# Patient Record
Sex: Female | Born: 1935
Health system: Southern US, Community
[De-identification: ages and names within clinical notes are randomized; demographics above are authoritative.]

## PROBLEM LIST (undated history)

## (undated) DIAGNOSIS — E039 Hypothyroidism, unspecified: Secondary | ICD-10-CM

## (undated) DIAGNOSIS — M359 Systemic involvement of connective tissue, unspecified: Secondary | ICD-10-CM

## (undated) DIAGNOSIS — E785 Hyperlipidemia, unspecified: Secondary | ICD-10-CM

## (undated) DIAGNOSIS — K8020 Calculus of gallbladder without cholecystitis without obstruction: Secondary | ICD-10-CM

## (undated) DIAGNOSIS — F329 Major depressive disorder, single episode, unspecified: Secondary | ICD-10-CM

## (undated) DIAGNOSIS — M1990 Unspecified osteoarthritis, unspecified site: Secondary | ICD-10-CM

## (undated) DIAGNOSIS — K5790 Diverticulosis of intestine, part unspecified, without perforation or abscess without bleeding: Secondary | ICD-10-CM

## (undated) DIAGNOSIS — D126 Benign neoplasm of colon, unspecified: Secondary | ICD-10-CM

## (undated) DIAGNOSIS — I10 Essential (primary) hypertension: Secondary | ICD-10-CM

## (undated) DIAGNOSIS — F32A Depression, unspecified: Secondary | ICD-10-CM

## (undated) DIAGNOSIS — M810 Age-related osteoporosis without current pathological fracture: Secondary | ICD-10-CM

## (undated) DIAGNOSIS — J449 Chronic obstructive pulmonary disease, unspecified: Secondary | ICD-10-CM

## (undated) DIAGNOSIS — R011 Cardiac murmur, unspecified: Secondary | ICD-10-CM

## (undated) HISTORY — DX: Systemic involvement of connective tissue, unspecified: M35.9

## (undated) HISTORY — DX: Benign neoplasm of colon, unspecified: D12.6

## (undated) HISTORY — DX: Age-related osteoporosis without current pathological fracture: M81.0

## (undated) HISTORY — DX: Cardiac murmur, unspecified: R01.1

## (undated) HISTORY — PX: WRIST FRACTURE SURGERY: SHX121

## (undated) HISTORY — DX: Unspecified osteoarthritis, unspecified site: M19.90

## (undated) HISTORY — DX: Hypothyroidism, unspecified: E03.9

## (undated) HISTORY — DX: Hyperlipidemia, unspecified: E78.5

## (undated) HISTORY — DX: Chronic obstructive pulmonary disease, unspecified: J44.9

## (undated) HISTORY — DX: Calculus of gallbladder without cholecystitis without obstruction: K80.20

## (undated) HISTORY — DX: Diverticulosis of intestine, part unspecified, without perforation or abscess without bleeding: K57.90

---
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
## 1999-07-31 ENCOUNTER — Other Ambulatory Visit: Admission: RE | Admit: 1999-07-31 | Discharge: 1999-07-31 | Payer: Self-pay | Admitting: Cardiology

## 2005-08-12 ENCOUNTER — Encounter: Admission: RE | Admit: 2005-08-12 | Discharge: 2005-08-12 | Payer: Self-pay | Admitting: Internal Medicine

## 2005-08-20 ENCOUNTER — Encounter: Admission: RE | Admit: 2005-08-20 | Discharge: 2005-08-20 | Payer: Self-pay | Admitting: Rheumatology

## 2005-08-26 ENCOUNTER — Encounter: Admission: RE | Admit: 2005-08-26 | Discharge: 2005-08-26 | Payer: Self-pay | Admitting: Rheumatology

## 2006-01-10 ENCOUNTER — Emergency Department (HOSPITAL_COMMUNITY): Admission: EM | Admit: 2006-01-10 | Discharge: 2006-01-10 | Payer: Self-pay | Admitting: Family Medicine

## 2006-07-25 ENCOUNTER — Emergency Department (HOSPITAL_COMMUNITY): Admission: EM | Admit: 2006-07-25 | Discharge: 2006-07-25 | Payer: Self-pay | Admitting: Emergency Medicine

## 2006-08-02 ENCOUNTER — Inpatient Hospital Stay (HOSPITAL_COMMUNITY): Admission: EM | Admit: 2006-08-02 | Discharge: 2006-08-04 | Payer: Self-pay | Admitting: Family Medicine

## 2006-08-06 ENCOUNTER — Ambulatory Visit: Payer: Self-pay | Admitting: Gastroenterology

## 2006-10-16 ENCOUNTER — Ambulatory Visit (HOSPITAL_COMMUNITY): Admission: EM | Admit: 2006-10-16 | Discharge: 2006-10-17 | Payer: Self-pay | Admitting: Orthopedic Surgery

## 2006-10-16 ENCOUNTER — Encounter: Payer: Self-pay | Admitting: Emergency Medicine

## 2009-12-15 ENCOUNTER — Emergency Department (HOSPITAL_COMMUNITY): Admission: EM | Admit: 2009-12-15 | Discharge: 2009-12-15 | Payer: Self-pay | Admitting: Family Medicine

## 2010-08-16 ENCOUNTER — Other Ambulatory Visit: Payer: Self-pay | Admitting: Internal Medicine

## 2010-08-16 DIAGNOSIS — R2681 Unsteadiness on feet: Secondary | ICD-10-CM

## 2010-08-20 ENCOUNTER — Ambulatory Visit
Admission: RE | Admit: 2010-08-20 | Discharge: 2010-08-20 | Disposition: A | Discharge status: Home / Self Care | Payer: Medicare HMO | Source: Ambulatory Visit | Attending: Internal Medicine | Admitting: Internal Medicine

## 2010-08-20 ENCOUNTER — Other Ambulatory Visit: Payer: Self-pay

## 2010-08-20 DIAGNOSIS — R2681 Unsteadiness on feet: Secondary | ICD-10-CM

## 2010-10-15 NOTE — Consult Note (Signed)
NAME:  Sarah Manning, Sarah Manning               ACCOUNT NO.:  [id number]   MEDICAL RECORD NO.:  [id number]          PATIENT TYPE:  OIB   LOCATION:  5034                         FACILITY:  [facility]   PHYSICIAN:  Artist Pais. Weingold, M.D.DATE OF BIRTH:  1925-01-27   DATE OF CONSULTATION:  10/16/2006  DATE OF DISCHARGE:                                 CONSULTATION   ER CONSULT:   REQUESTING PHYSICIAN:  Donnetta Hutching, M.D.   REASON FOR CONSULTATION:  Sarah Manning is a 77 year old right-hand  dominant female who was at the [employer] graduation, watching her grandson  graduate, when she fell and sustained a significant injury to her left  upper extremity.  She was taken to the [facility] emergency  room with a contusion over the left eye and an obvious deformity to her  left hand and wrist.  She is an otherwise healthy 77 year old female.   She has no known drug allergies.   Currently on Synthroid.  No other medications.  No recent  hospitalizations or surgery.   FAMILY HISTORY:  Noncontributory.   SOCIAL HISTORY:  Noncontributory.   PHYSICAL EXAMINATION:  GENERAL:  A well-developed and well-nourished  female in mild distress.  She has an obvious angular deformity to her  hand and wrist area on the left with subjective numbness and tingling in  her fingers.  There is no open wound, but the skin is tented on the  volar aspect.   X-rays show a fracture dislocation of the radius and the ulna with  significant dorsal displacement.   Patient was given a 2% plain lidocaine hematoma block, was placed in  finger trap traction with 10 pounds of counter traction across the  radial carpal joint.  Closed reduction was performed.  She was placed in  a sugar tong splint, and she will be transferred to [facility]  for operative intervention to include carpal tunnel release for  significant numbness and tingling as well as operative fixation of her  displaced radius and ulnar fractures.      Artist Pais Mina Marble, M.D.  Electronically Signed     MAW/MEDQ  D:  10/16/2006  T:  10/16/2006  Job:  [id number]

## 2010-10-15 NOTE — Op Note (Signed)
NAME:  Cafaro, Eshika               ACCOUNT NO.:  [id number]   MEDICAL RECORD NO.:  [id number]          PATIENT TYPE:  OIB   LOCATION:  5034                         FACILITY:  [facility]   PHYSICIAN:  Artist Pais. Weingold, M.D.DATE OF BIRTH:  03-05-1925   DATE OF PROCEDURE:  10/16/2006  DATE OF DISCHARGE:                               OPERATIVE REPORT   PREOPERATIVE DIAGNOSES:  1. Displaced left radius and ulnar fractures.  2. Carpal tunnel syndrome.   POSTOPERATIVE DIAGNOSES:  1. Displaced left radius and ulnar fractures.  2. Carpal tunnel syndrome.   PROCEDURE:  ORIF above using DVR plate and screws and carpal tunnel  release.   SURGEON:  Artist Pais. Mina Marble, M.D.   ASSISTANT:  None.   ANESTHESIA:  General.   TOURNIQUET TIME:  1 hour.   COMPLICATIONS:  None.   DRAIN:  None.   DESCRIPTION OF PROCEDURE:  The patient was taken to the operating suite  and after the induction of general anesthesia, the left upper extremity  was prepped in the usual sterile fashion.  Esmarch was used to  exsanguinate the limb, the tourniquet was inflated to 215 mmHg. At this  point in time, a standard FCR approach to the distal radius was  undertaken.  The skin was incised over the palpable border of the flexor  carpi radialis per 6 cm. The sheath overlying the FCR was incised, it  was tracked to the midline, the radial artery to the lateral . The  interval between these two was developed and dissection was carried down  to the level of the pronator quadratus.  This was dissected off the  volar aspect of the distal radius.  It was disrupted distally in the  area of the fracture site.  The fracture site was identified  and  debrided of clot, reduction was performed after release of the first  dorsal compartment in the brachialis. After reduction was performed, a  standard DVR left plate was placed on the lower aspect of the distal  radius under direct and fluoroscopic guidance and was  positioned  appropriately, fixed with cortical screws followed by placement of  smooth pegs distally.  After this was done, a closed reduction was  performed of the ulnar fracture which was then deemed stable both in  flexion/extension,  pronation/supination. The median nerve was  identified proximally. Tracing the carpal canal, a counter incision was  made in the palmar aspect of the left hand. A small incision was made  over the transverse carpal ligament which was divided thus decompressing  the nerve. The wound  was irrigated.  There was blood in the canal, this was irrigated out and  both incisions were then closed with 3-0 Prolene subcuticular stitches.  Steri-Strips, 4x4s fluffs and a volar splint was applied.  The patient  tolerated both procedures well and went to recovery in stable condition.      Artist Pais Mina Marble, M.D.  Electronically Signed     MAW/MEDQ  D:  10/16/2006  T:  10/17/2006  Job:  [id number]

## 2010-10-18 NOTE — Discharge Summary (Signed)
NAME:  Manning, Sarah               ACCOUNT NO.:  [id number]   MEDICAL RECORD NO.:  [id number]          PATIENT TYPE:  INP   LOCATION:  4714                         FACILITY:  [facility]   PHYSICIAN:  Mark A. Perini, M.D.   DATE OF BIRTH:  1925-04-13   DATE OF ADMISSION:  08/02/2006  DATE OF DISCHARGE:  08/04/2006                               DISCHARGE SUMMARY   DISCHARGE DIAGNOSES:  1. Acute lower gastrointestinal bleed most likely due to      diverticulosis.  2. Colon polyp 4 mm removed during colonoscopy this admission.  3. Internal hemorrhoids.  4. Hypothyroidism.  5. Recent allergic reaction to Vytorin which caused a rash which is      resolved currently.  6. Osteopenia.   PROCEDURES:  1. Colonoscopy which showed a colon polyp, diverticulosis and internal      hemorrhoids and upper endoscopy which was normal.  2. Transfusion of 2 units of packed red cells.   DISCHARGE MEDICATIONS:  Sarah Manning is to stop aspirin therapy.  She is to  hold Fosamax until further notice.  She is to resume her Synthroid at  her previous dose.  She may resume her multivitamins and she is to take  an over-the-counter ferrous sulfate pill 325 mg once daily with food  until further notice.   HISTORY OF PRESENT ILLNESS:  Sarah Manning is a pleasant 77 year old female who  presented with acute onset of bright red bleeding per rectum and  presented to the emergency room.  She had persistent bleeding and was  admitted.  She required IV fluid resuscitation and did require 2 units  of packed red cells.  She was seen by GI and prepped and subsequently  taken to endoscopy with results as noted above.  Her bleeding stopped.  She remained hemodynamically stable during the entire stay and on August 04, 2006, she was deemed stable for discharge home.   DISCHARGE PHYSICAL EXAM:  VITAL SIGNS:  Temperature 97.6, afebrile,  pulse 81, respiratory rate 18, blood pressure 130/59, 99% saturation on  room air.  GENERAL:  She is in no  acute distress.  Alert and oriented  x4.  LUNGS:  Clear to auscultation bilaterally with no wheezes, rales or  rhonchi.  HEART:  Regular rate and rhythm with no murmur, rub or gallop.  There is no edema.   DISCHARGE LABORATORY DATA:  White count 6.6, hemoglobin 9.6, platelet  count 216,000.  Other notable laboratory data on August 03, 2006, sodium  139, potassium 4.4, chloride 108, CO2 of 20, BUN 13, creatinine 0.64,  glucose 107.  LFTs normal except an albumin of 2.5.   DISCHARGE INSTRUCTIONS:  Sarah Manning is to follow a low-salt diet.  She is to  increase her activity slowly.  She has a physical with me next week and  is going to keep this visit.  She will call if she has any recurrent  problems.           ______________________________  Redge Gainer Waynard Edwards, M.D.     MAP/MEDQ  D:  08/04/2006  T:  08/04/2006  Job:  [id number]  cc:   Judie Petit T. Russella Dar, MD, Clementeen Graham

## 2010-10-18 NOTE — H&P (Signed)
NAME:  Sarah Manning, Sarah Manning               ACCOUNT NO.:  [id number]   MEDICAL RECORD NO.:  [id number]          PATIENT TYPE:  INP   LOCATION:  4714                         FACILITY:  [facility]   PHYSICIAN:  Geoffry Paradise, M.D.  DATE OF BIRTH:  1924-12-22   DATE OF ADMISSION:  08/02/2006  DATE OF DISCHARGE:                              HISTORY & PHYSICAL   CHIEF COMPLAINT:  Gastrointestinal bleed.   HISTORY OF PRESENT ILLNESS:  Ms. Fredric Mare is a very pleasant 77 year old  who has been exceptionally healthy, presenting with multiple episodes of  bright red blood per rectum.  She has no prior GI history, and her last  colonoscopy was 4 years ago, unremarkable.  She had an excellent day  yesterday and was quite healthy but awoke this morning at 9:00 a.m. with  rectal pressure and subsequently has had at least 4 to 5 episodes of  profuse bright red blood per rectum.  She has had no frank abdominal  pain.  She denies any chest pain or shortness of breath.  She has had no  headache, dizziness or lightheadedness.  She had no melenic stool and  has been on no aspirin products, but HAS HAD A RECENT COURSE OF  STERAPRED FOR AN ALLERGIC REACTION TO VYTORIN.  She is admitted at this  time for bowel rest, IV fluids and monitoring.   PAST MEDICAL HISTORY:  NO KNOWN DRUG ALLERGIES.   MEDICATIONS:  Include Synthroid 125 mcg daily, Fosamax 70 mg weekly,  multivitamin one daily, Benadryl p.r.n. and recent Sterapred.   MEDICAL ILLNESSES:  Include hypothyroidism and osteopenia.   SURGICAL ILLNESSES:  None.   HOSPITALIZATIONS:  Only childbirth.   FAMILY HISTORY:  Noncontributory.   SOCIAL HISTORY:  The patient is married and has two children.  She is  retired.  Does not smoke or drink and is extremely active.   PHYSICAL EXAM:  VITAL SIGNS:  Temperature 97.9, blood pressure 147/83,  pulse 92.  Respiratory rate is 18, O2 saturation 99%.  GENERAL:  The patient looks exceptionally healthy.  She is bright,  alert, oriented, talkative.  HEENT:  Anicteric.  Pupils round reactive light accommodation.  Oropharynx benign.  NECK:  No JVD or bruits.  LUNGS:  Clear.  CARDIOVASCULAR:  Regular rate and rhythm.  No murmur, gallop, rub or  heave.  BACK:  Without CVA or spinal tenderness.  ABDOMEN:  Soft, nontender.  No hepatosplenomegaly.  Bowel sounds normal.  RECTAL:  Exam per EDP, vault full of bright red blood.  EXTREMITIES:  No cyanosis, clubbing or edema.  Intact distal pulses.  Joints normal.  NEUROLOGICALLY:  She is nonfocal.   LABORATORY:  H&H:  Hemoglobin 12.4, hematocrit 37.2.  Chemistries  pending.   ASSESSMENT:  Lower gastrointestinal bleed compatible with a diverticular  bleed, hemodynamically stable at this time.  Hemoglobin stable.   PLAN:  Admit for IV fluids, bowel rest, empiric proton pump inhibitor  and GI consult for endoscopy/colonoscopy.           ______________________________  Geoffry Paradise, M.D.     RA/MEDQ  D:  08/02/2006  T:  08/02/2006  Job:  [id number]

## 2010-12-13 ENCOUNTER — Other Ambulatory Visit (HOSPITAL_COMMUNITY): Payer: Self-pay | Admitting: Internal Medicine

## 2010-12-13 DIAGNOSIS — R011 Cardiac murmur, unspecified: Secondary | ICD-10-CM

## 2010-12-16 ENCOUNTER — Ambulatory Visit (HOSPITAL_COMMUNITY): Payer: Medicare HMO | Attending: Cardiology | Admitting: Radiology

## 2010-12-16 DIAGNOSIS — I080 Rheumatic disorders of both mitral and aortic valves: Secondary | ICD-10-CM | POA: Insufficient documentation

## 2010-12-16 DIAGNOSIS — I079 Rheumatic tricuspid valve disease, unspecified: Secondary | ICD-10-CM | POA: Insufficient documentation

## 2010-12-16 DIAGNOSIS — R011 Cardiac murmur, unspecified: Secondary | ICD-10-CM | POA: Insufficient documentation

## 2010-12-16 DIAGNOSIS — E785 Hyperlipidemia, unspecified: Secondary | ICD-10-CM | POA: Insufficient documentation

## 2010-12-17 ENCOUNTER — Encounter (HOSPITAL_COMMUNITY): Payer: Self-pay | Admitting: Radiology

## 2013-05-05 ENCOUNTER — Ambulatory Visit (HOSPITAL_COMMUNITY)
Admission: RE | Admit: 2013-05-05 | Discharge: 2013-05-05 | Disposition: A | Discharge status: Home / Self Care | Payer: Medicare HMO | Source: Ambulatory Visit | Attending: Vascular Surgery | Admitting: Vascular Surgery

## 2013-05-05 ENCOUNTER — Other Ambulatory Visit (HOSPITAL_COMMUNITY): Payer: Self-pay | Admitting: Internal Medicine

## 2013-05-05 ENCOUNTER — Encounter (INDEPENDENT_AMBULATORY_CARE_PROVIDER_SITE_OTHER): Payer: Self-pay

## 2013-05-05 DIAGNOSIS — M79609 Pain in unspecified limb: Secondary | ICD-10-CM

## 2014-06-15 DIAGNOSIS — M7062 Trochanteric bursitis, left hip: Secondary | ICD-10-CM | POA: Diagnosis not present

## 2014-11-06 ENCOUNTER — Emergency Department (HOSPITAL_COMMUNITY)
Admission: EM | Admit: 2014-11-06 | Discharge: 2014-11-06 | Disposition: A | Discharge status: Home / Self Care | Payer: Commercial Managed Care - HMO | Attending: Emergency Medicine | Admitting: Emergency Medicine

## 2014-11-06 ENCOUNTER — Encounter (HOSPITAL_COMMUNITY): Payer: Self-pay | Admitting: Emergency Medicine

## 2014-11-06 ENCOUNTER — Emergency Department (HOSPITAL_COMMUNITY): Payer: Commercial Managed Care - HMO

## 2014-11-06 DIAGNOSIS — Z880 Allergy status to penicillin: Secondary | ICD-10-CM | POA: Insufficient documentation

## 2014-11-06 DIAGNOSIS — R51 Headache: Secondary | ICD-10-CM | POA: Insufficient documentation

## 2014-11-06 DIAGNOSIS — Z6824 Body mass index (BMI) 24.0-24.9, adult: Secondary | ICD-10-CM | POA: Diagnosis not present

## 2014-11-06 DIAGNOSIS — K8020 Calculus of gallbladder without cholecystitis without obstruction: Secondary | ICD-10-CM | POA: Diagnosis not present

## 2014-11-06 DIAGNOSIS — R1084 Generalized abdominal pain: Secondary | ICD-10-CM | POA: Insufficient documentation

## 2014-11-06 DIAGNOSIS — I10 Essential (primary) hypertension: Secondary | ICD-10-CM | POA: Diagnosis not present

## 2014-11-06 DIAGNOSIS — R109 Unspecified abdominal pain: Secondary | ICD-10-CM | POA: Diagnosis not present

## 2014-11-06 DIAGNOSIS — R509 Fever, unspecified: Secondary | ICD-10-CM | POA: Insufficient documentation

## 2014-11-06 HISTORY — DX: Essential (primary) hypertension: I10

## 2014-11-06 LAB — COMPREHENSIVE METABOLIC PANEL
ALT: 17 U/L (ref 14–54)
AST: 24 U/L (ref 15–41)
Albumin: 3.6 g/dL (ref 3.5–5.0)
Alkaline Phosphatase: 52 U/L (ref 38–126)
Anion gap: 9 (ref 5–15)
BILIRUBIN TOTAL: 0.6 mg/dL (ref 0.3–1.2)
BUN: 10 mg/dL (ref 6–20)
CHLORIDE: 105 mmol/L (ref 101–111)
CO2: 28 mmol/L (ref 22–32)
Calcium: 9.2 mg/dL (ref 8.9–10.3)
Creatinine, Ser: 0.75 mg/dL (ref 0.44–1.00)
Glucose, Bld: 96 mg/dL (ref 65–99)
POTASSIUM: 4 mmol/L (ref 3.5–5.1)
SODIUM: 142 mmol/L (ref 135–145)
Total Protein: 7.1 g/dL (ref 6.5–8.1)

## 2014-11-06 LAB — CBC WITH DIFFERENTIAL/PLATELET
BASOS ABS: 0 10*3/uL (ref 0.0–0.1)
BASOS PCT: 0 % (ref 0–1)
EOS ABS: 0 10*3/uL (ref 0.0–0.7)
Eosinophils Relative: 0 % (ref 0–5)
HEMATOCRIT: 37.1 % (ref 36.0–46.0)
HEMOGLOBIN: 12.2 g/dL (ref 12.0–15.0)
LYMPHS ABS: 1.2 10*3/uL (ref 0.7–4.0)
LYMPHS PCT: 24 % (ref 12–46)
MCH: 29.7 pg (ref 26.0–34.0)
MCHC: 32.9 g/dL (ref 30.0–36.0)
MCV: 90.3 fL (ref 78.0–100.0)
Monocytes Absolute: 0.5 10*3/uL (ref 0.1–1.0)
Monocytes Relative: 10 % (ref 3–12)
NEUTROS PCT: 66 % (ref 43–77)
Neutro Abs: 3.4 10*3/uL (ref 1.7–7.7)
Platelets: 268 10*3/uL (ref 150–400)
RBC: 4.11 MIL/uL (ref 3.87–5.11)
RDW: 12.4 % (ref 11.5–15.5)
WBC: 5.2 10*3/uL (ref 4.0–10.5)

## 2014-11-06 LAB — URINALYSIS, ROUTINE W REFLEX MICROSCOPIC
Bilirubin Urine: NEGATIVE
GLUCOSE, UA: NEGATIVE mg/dL
HGB URINE DIPSTICK: NEGATIVE
Ketones, ur: NEGATIVE mg/dL
LEUKOCYTES UA: NEGATIVE
NITRITE: NEGATIVE
Protein, ur: NEGATIVE mg/dL
SPECIFIC GRAVITY, URINE: 1.018 (ref 1.005–1.030)
UROBILINOGEN UA: 0.2 mg/dL (ref 0.0–1.0)
pH: 6 (ref 5.0–8.0)

## 2014-11-06 LAB — I-STAT TROPONIN, ED: Troponin i, poc: 0 ng/mL (ref 0.00–0.08)

## 2014-11-06 LAB — LACTIC ACID, PLASMA: LACTIC ACID, VENOUS: 1.1 mmol/L (ref 0.5–2.0)

## 2014-11-06 LAB — LIPASE, BLOOD: Lipase: 20 U/L — ABNORMAL LOW (ref 22–51)

## 2014-11-06 MED ORDER — ONDANSETRON HCL 4 MG PO TABS: 4.0000 mg | ORAL_TABLET | Freq: Four times a day (QID) | ORAL | Status: DC

## 2014-11-06 MED ORDER — IOHEXOL 300 MG/ML  SOLN
100.0000 mL | Freq: Once | INTRAMUSCULAR | Status: AC | PRN
  Administered 2014-11-06: 100 mL via INTRAVENOUS

## 2014-11-06 MED ORDER — IOHEXOL 300 MG/ML  SOLN
25.0000 mL | Freq: Once | INTRAMUSCULAR | Status: AC | PRN
  Administered 2014-11-06: 25 mL via ORAL

## 2014-11-06 NOTE — ED Notes (Signed)
Pt in via EMS C/O abd pain, fever/chills, as well as headache. Pt states it has been going on for past week but got worse Saturday.

## 2014-11-06 NOTE — Discharge Instructions (Signed)
You had a CT scan performed today.  The scan shows you have gallstones in your gallbladder.  Please follow up with your family doctor for further evaluation.    Abdominal Pain Many things can cause abdominal pain. Usually, abdominal pain is not caused by a disease and will improve without treatment. It can often be observed and treated at home. Your health care provider will do a physical exam and possibly order blood tests and X-rays to help determine the seriousness of your pain. However, in many cases, more time must pass before a clear cause of the pain can be found. Before that point, your health care provider may not know if you need more testing or further treatment. HOME CARE INSTRUCTIONS  Monitor your abdominal pain for any changes. The following actions may help to alleviate any discomfort you are experiencing:  Only take over-the-counter or prescription medicines as directed by your health care provider.  Do not take laxatives unless directed to do so by your health care provider.  Try a clear liquid diet (broth, tea, or water) as directed by your health care provider. Slowly move to a bland diet as tolerated. SEEK MEDICAL CARE IF:  You have unexplained abdominal pain.  You have abdominal pain associated with nausea or diarrhea.  You have pain when you urinate or have a bowel movement.  You experience abdominal pain that wakes you in the night.  You have abdominal pain that is worsened or improved by eating food.  You have abdominal pain that is worsened with eating fatty foods.  You have a fever. SEEK IMMEDIATE MEDICAL CARE IF:   Your pain does not go away within 2 hours.  You keep throwing up (vomiting).  Your pain is felt only in portions of the abdomen, such as the right side or the left lower portion of the abdomen.  You pass bloody or black tarry stools. MAKE SURE YOU:  Understand these instructions.   Will watch your condition.   Will get help right away  if you are not doing well or get worse.  Document Released: 02/26/2005 Document Revised: 05/24/2013 Document Reviewed: 01/26/2013 [facility] Patient Information 2015 [place], [place]. This information is not intended to replace advice given to you by your health care provider. Make sure you discuss any questions you have with your health care provider.  Cholelithiasis Cholelithiasis (also called gallstones) is a form of gallbladder disease in which gallstones form in your gallbladder. The gallbladder is an organ that stores bile made in the liver, which helps digest fats. Gallstones begin as small crystals and slowly grow into stones. Gallstone pain occurs when the gallbladder spasms and a gallstone is blocking the duct. Pain can also occur when a stone passes out of the duct.  RISK FACTORS  Being female.   Having multiple pregnancies. Health care providers sometimes advise removing diseased gallbladders before future pregnancies.   Being obese.  Eating a diet heavy in fried foods and fat.   Being older than 68 years and increasing age.   Prolonged use of medicines containing female hormones.   Having diabetes mellitus.   Rapidly losing weight.   Having a family history of gallstones (heredity).  SYMPTOMS  Nausea.   Vomiting.  Abdominal pain.   Yellowing of the skin (jaundice).   Sudden pain. It may persist from several minutes to several hours.  Fever.   Tenderness to the touch. In some cases, when gallstones do not move into the bile duct, people have no pain  or symptoms. These are called "silent" gallstones.  TREATMENT Silent gallstones do not need treatment. In severe cases, emergency surgery may be required. Options for treatment include:  Surgery to remove the gallbladder. This is the most common treatment.  Medicines. These do not always work and may take 6-12 months or more to work.  Shock wave treatment (extracorporeal biliary lithotripsy). In this  treatment an ultrasound machine sends shock waves to the gallbladder to break gallstones into smaller pieces that can pass into the intestines or be dissolved by medicine. HOME CARE INSTRUCTIONS   Only take over-the-counter or prescription medicines for pain, discomfort, or fever as directed by your health care provider.   Follow a low-fat diet until seen again by your health care provider. Fat causes the gallbladder to contract, which can result in pain.   Follow up with your health care provider as directed. Attacks are almost always recurrent and surgery is usually required for permanent treatment.  SEEK IMMEDIATE MEDICAL CARE IF:   Your pain increases and is not controlled by medicines.   You have a fever or persistent symptoms for more than 2-3 days.   You have a fever and your symptoms suddenly get worse.   You have persistent nausea and vomiting.  MAKE SURE YOU:   Understand these instructions.  Will watch your condition.  Will get help right away if you are not doing well or get worse. Document Released: 05/15/2005 Document Revised: 01/19/2013 Document Reviewed: 11/10/2012 [facility] Patient Information 2015 [place], [place]. This information is not intended to replace advice given to you by your health care provider. Make sure you discuss any questions you have with your health care provider.

## 2014-11-06 NOTE — ED Notes (Signed)
Pt kept fluids down. Reporting no abd pain at this time.

## 2014-11-06 NOTE — ED Notes (Signed)
Pt resting, visitor at bedside, no needs at this time

## 2014-11-06 NOTE — ED Notes (Signed)
Pt d/c'd from IV, monitor, continuous pulse oximetry and blood pressure cuff; pt getting dressed to be discharged home

## 2014-11-06 NOTE — ED Provider Notes (Signed)
CSN: [id number]     Arrival date & time 11/06/14  0730 History   First MD Initiated Contact with Patient 11/06/14 [phone]     Chief Complaint  Patient presents with  . Abdominal Pain  . Fever  . Chills  . Headache    Patient is a 81 y.o. female presenting with abdominal pain, fever, and headaches. The history is provided by the patient. No language interpreter was used.  Abdominal Pain Associated symptoms: fever   Fever Associated symptoms: headaches   Headache Associated symptoms: abdominal pain and fever    Sarah Manning presents for evaluation of abdominal pain. She reports 1 week of intermittent abdominal pain. The pain is described as generalized and feels like a toothache. She initially felt like she needed to have a bowel movement with this pain. Her last bowel movement was this morning and was normal. She took milk of magnesia yesterday. She has nausea but no vomiting. No dysuria. There are no clear alleviating or worsening factors for the abdominal pain. She has no history of similar previous symptoms. She had diaphoresis today. She denies any chest pain, cough. Symptoms are moderate, waxing and waning, worsening.   Past Medical History  Diagnosis Date  . Hypertension    History reviewed. No pertinent past surgical history. No family history on file. History  Substance Use Topics  . Smoking status: Never Smoker   . Smokeless tobacco: Never Used  . Alcohol Use: No   OB History    No data available     Review of Systems  Constitutional: Positive for fever.  Gastrointestinal: Positive for abdominal pain.  Neurological: Positive for headaches.  All other systems reviewed and are negative.     Allergies  Amoxicillin and Penicillins  Home Medications   Prior to Admission medications   Not on File   BP 184/68 mmHg  Pulse 73  Temp(Src) 98.3 F (36.8 C) (Oral)  Resp 19  SpO2 99% Physical Exam  Constitutional: She is oriented to person, place, and time. She  appears well-developed and well-nourished.  HENT:  Head: Normocephalic and atraumatic.  Cardiovascular: Normal rate and regular rhythm.   No murmur heard. Pulmonary/Chest: Effort normal and breath sounds normal. No respiratory distress.  Abdominal: Soft. There is no rebound and no guarding.  Moderate lower abdominal tenderness  Musculoskeletal: She exhibits no edema or tenderness.  Neurological: She is alert and oriented to person, place, and time.  Skin: Skin is warm and dry.  Psychiatric: She has a normal mood and affect. Her behavior is normal.  Nursing note and vitals reviewed.   ED Course  Procedures (including critical care time) Labs Review Labs Reviewed  LIPASE, BLOOD - Abnormal; Notable for the following:    Lipase 20 (*)    All other components within normal limits  CBC WITH DIFFERENTIAL/PLATELET  COMPREHENSIVE METABOLIC PANEL  URINALYSIS, ROUTINE W REFLEX MICROSCOPIC (NOT AT [facility])  LACTIC ACID, PLASMA  I-STAT TROPOININ, ED    Imaging Review Ct Abdomen Pelvis W Contrast  11/06/2014   CLINICAL DATA:  Abdominal pain, fever, and chills.  Headache.  EXAM: CT ABDOMEN AND PELVIS WITH CONTRAST  TECHNIQUE: Multidetector CT imaging of the abdomen and pelvis was performed using the standard protocol following bolus administration of intravenous contrast.  CONTRAST:  136mL OMNIPAQUE IOHEXOL 300 MG/ML  SOLN  COMPARISON:  08/26/2005  FINDINGS: BODY WALL: No contributory findings.  LOWER CHEST: No contributory findings.  ABDOMEN/PELVIS:  Liver: No focal abnormality.  Biliary: Cholelithiasis.  No indication  of acute cholecystitis.  Pancreas: Benign appearing 5 mm cyst in the pancreatic body. No explanation for patient's symptoms.  Spleen: 2 tiny low densities are too small to characterize, statistically benign/incidental.  Adrenals: Unremarkable.  Kidneys and ureters: No hydronephrosis or stone.  Bladder: Unremarkable.  Reproductive: No pathologic findings.  Bowel: No obstruction. No  appendicitis.  Colonic diverticulosis.  Retroperitoneum: There is a 1 cm nonenhancing low-density structure near the ligament of Treitz which is stable from 2007. Burtis Junes an exophytic benign pancreatic cyst (the uncinate may contact this finding) or a lymphangioma.  Peritoneum: No ascites or pneumoperitoneum.  Vascular: No acute abnormality.  OSSEOUS: Osteopenia with remote and healed left obturator ring fractures .  IMPRESSION: 1. No infectious source identified. 2. Cholelithiasis. 3. Colonic diverticulosis.   Electronically Signed   By: Monte Fantasia M.D.   On: 11/06/2014 10:31     EKG Interpretation None      MDM   Final diagnoses:  Generalized abdominal pain    Patient here for evaluation of abdominal pain. Patient reports history of intermittent generalized abdominal pain. On initial evaluation patient had moderate lower abdominal tenderness. CT scan obtained that was negative for any acute abnormalities, there is evidence of cholelithiasis but no cholecystitis. On repeat abdominal exam patient's abdomen is soft and nontender. Discussed with patient unclear source of abdominal pain, no recurrent pain after fluids in the department. Recommend close outpatient primary care follow-up as well as return precautions. Discussed findings of gallstones and need for further evaluation. Provided a prescription for Zofran.    Quintella Reichert, MD 11/06/14 1455

## 2014-11-22 DIAGNOSIS — K5909 Other constipation: Secondary | ICD-10-CM | POA: Diagnosis not present

## 2014-12-16 ENCOUNTER — Emergency Department (HOSPITAL_COMMUNITY)
Admission: EM | Admit: 2014-12-16 | Discharge: 2014-12-16 | Disposition: A | Discharge status: Home / Self Care | Payer: Commercial Managed Care - HMO | Attending: Emergency Medicine | Admitting: Emergency Medicine

## 2014-12-16 ENCOUNTER — Encounter (HOSPITAL_COMMUNITY): Payer: Self-pay | Admitting: *Deleted

## 2014-12-16 DIAGNOSIS — Z880 Allergy status to penicillin: Secondary | ICD-10-CM | POA: Insufficient documentation

## 2014-12-16 DIAGNOSIS — R109 Unspecified abdominal pain: Secondary | ICD-10-CM | POA: Insufficient documentation

## 2014-12-16 DIAGNOSIS — I10 Essential (primary) hypertension: Secondary | ICD-10-CM | POA: Diagnosis not present

## 2014-12-16 DIAGNOSIS — R110 Nausea: Secondary | ICD-10-CM | POA: Diagnosis not present

## 2014-12-16 DIAGNOSIS — Z79899 Other long term (current) drug therapy: Secondary | ICD-10-CM | POA: Diagnosis not present

## 2014-12-16 DIAGNOSIS — R197 Diarrhea, unspecified: Secondary | ICD-10-CM | POA: Diagnosis not present

## 2014-12-16 LAB — URINALYSIS, ROUTINE W REFLEX MICROSCOPIC
BILIRUBIN URINE: NEGATIVE
GLUCOSE, UA: NEGATIVE mg/dL
Hgb urine dipstick: NEGATIVE
Ketones, ur: NEGATIVE mg/dL
Nitrite: NEGATIVE
PH: 5 (ref 5.0–8.0)
Protein, ur: NEGATIVE mg/dL
Specific Gravity, Urine: 1.015 (ref 1.005–1.030)
Urobilinogen, UA: 0.2 mg/dL (ref 0.0–1.0)

## 2014-12-16 LAB — COMPREHENSIVE METABOLIC PANEL
ALBUMIN: 3.8 g/dL (ref 3.5–5.0)
ALK PHOS: 57 U/L (ref 38–126)
ALT: 16 U/L (ref 14–54)
AST: 23 U/L (ref 15–41)
Anion gap: 9 (ref 5–15)
BUN: 12 mg/dL (ref 6–20)
CALCIUM: 9.8 mg/dL (ref 8.9–10.3)
CO2: 28 mmol/L (ref 22–32)
Chloride: 103 mmol/L (ref 101–111)
Creatinine, Ser: 0.82 mg/dL (ref 0.44–1.00)
GFR calc Af Amer: >60 mL/min (ref >60)
GFR calc non Af Amer: >60 mL/min (ref >60)
GLUCOSE: 103 mg/dL — AB (ref 65–99)
Potassium: 4.2 mmol/L (ref 3.5–5.1)
SODIUM: 140 mmol/L (ref 135–145)
TOTAL PROTEIN: 8 g/dL (ref 6.5–8.1)
Total Bilirubin: 0.4 mg/dL (ref 0.3–1.2)

## 2014-12-16 LAB — URINE MICROSCOPIC-ADD ON

## 2014-12-16 LAB — CBC
HCT: 39.7 % (ref 36.0–46.0)
HEMOGLOBIN: 13 g/dL (ref 12.0–15.0)
MCH: 29.3 pg (ref 26.0–34.0)
MCHC: 32.7 g/dL (ref 30.0–36.0)
MCV: 89.6 fL (ref 78.0–100.0)
Platelets: 314 10*3/uL (ref 150–400)
RBC: 4.43 MIL/uL (ref 3.87–5.11)
RDW: 12.7 % (ref 11.5–15.5)
WBC: 5.1 10*3/uL (ref 4.0–10.5)

## 2014-12-16 LAB — LIPASE, BLOOD: LIPASE: 33 U/L (ref 22–51)

## 2014-12-16 MED ORDER — OXYCODONE-ACETAMINOPHEN 5-325 MG PO TABS
1.0000 | ORAL_TABLET | Freq: Once | ORAL | Status: AC
  Administered 2014-12-16: 1 via ORAL

## 2014-12-16 MED ORDER — OXYCODONE-ACETAMINOPHEN 5-325 MG PO TABS
ORAL_TABLET | ORAL | Status: AC
  Filled 2014-12-16: qty 1

## 2014-12-16 NOTE — ED Notes (Signed)
Pt reports being seen recently for same. Pt having generalized abd pain, bloated, nausea. Was sent for surgery consult due to gallstones on previous ct scan but they diagnosed her with constipation and prescribed miralax. Pt now having diarrhea.

## 2014-12-16 NOTE — ED Provider Notes (Addendum)
CSN: [id number]     Arrival date & time 12/16/14  1806 History   First MD Initiated Contact with Patient 12/16/14 2014     Chief Complaint  Patient presents with  . Abdominal Pain  . Nausea  . Diarrhea     (Consider location/radiation/quality/duration/timing/severity/associated sxs/prior Treatment) HPI Complaint of diarrhea nonbloody onset 4 days ago presently 5 episodes per day accompanied by diffuse bloating feeling in her abdomen. She she admits to nausea, no vomiting. She is presently hungry, and asymptomatic.Marland Kitchen She denies fever. She patient had been on MiraLAX which she stopped 3 days ago. No other associated symptoms. Patient is concerned as she had CT scan of the abdomen pelvis one month ago which showed gallstones, diverticulosis . No other associated symptoms nothing made symptoms better or worse. No recent travel or antibiotics Past Medical History  Diagnosis Date  . Hypertension    past surgical history negative History reviewed. No pertinent past surgical history. History reviewed. No pertinent family history. History  Substance Use Topics  . Smoking status: Never Smoker   . Smokeless tobacco: Never Used  . Alcohol Use: No   OB History    No data available     Review of Systems  Constitutional: Negative.   HENT: Negative.   Respiratory: Negative.   Cardiovascular: Negative.   Gastrointestinal: Positive for abdominal pain and diarrhea.  Musculoskeletal: Negative.   Skin: Negative.   Neurological: Negative.   Psychiatric/Behavioral: Negative.   All other systems reviewed and are negative.     Allergies  Aspirin; Amoxicillin; and Penicillins  Home Medications   Prior to Admission medications   Medication Sig Start Date End Date Taking? Authorizing Provider  CRESTOR 5 MG tablet Take 5 mg by mouth at bedtime.  10/31/14  Yes Historical Provider, MD  lisinopril (PRINIVIL,ZESTRIL) 20 MG tablet Take 20 mg by mouth daily. 10/11/14  Yes Historical Provider, MD   LYRICA 50 MG capsule Take 50 mg by mouth at bedtime.  10/03/14  Yes Historical Provider, MD  SYNTHROID 125 MCG tablet Take 125 mcg by mouth daily. 09/22/14  Yes Historical Provider, MD  ondansetron (ZOFRAN) 4 MG tablet Take 1 tablet (4 mg total) by mouth every 6 (six) hours. 11/06/14   Quintella Reichert, MD   BP 162/66 mmHg  Pulse 79  Temp(Src) 97.6 F (36.4 C) (Oral)  Resp 20  Ht 5\' 5"  (1.651 m)  Wt 135 lb (61.236 kg)  BMI 22.47 kg/m2  SpO2 97% Physical Exam  Constitutional: She appears well-developed and well-nourished.  HENT:  Head: Normocephalic and atraumatic.  Eyes: Conjunctivae are normal. Pupils are equal, round, and reactive to light.  Neck: Neck supple. No tracheal deviation present. No thyromegaly present.  Cardiovascular: Normal rate and regular rhythm.   No murmur heard. Pulmonary/Chest: Effort normal and breath sounds normal.  Abdominal: Soft. Bowel sounds are normal. She exhibits no distension. There is no tenderness.  Musculoskeletal: Normal range of motion. She exhibits no edema or tenderness.  Neurological: She is alert. Coordination normal.  Skin: Skin is warm and dry. No rash noted.  Psychiatric: She has a normal mood and affect.  Nursing note and vitals reviewed.   ED Course  Procedures (including critical care time) Labs Review Labs Reviewed  COMPREHENSIVE METABOLIC PANEL - Abnormal; Notable for the following:    Glucose, Bld 103 (*)    All other components within normal limits  URINALYSIS, ROUTINE W REFLEX MICROSCOPIC (NOT AT [facility]) - Abnormal; Notable for the following:    APPearance  CLOUDY (*)    Leukocytes, UA MODERATE (*)    All other components within normal limits  URINE MICROSCOPIC-ADD ON - Abnormal; Notable for the following:    Squamous Epithelial / LPF FEW (*)    All other components within normal limits  LIPASE, BLOOD  CBC    Imaging Review No results found.   EKG Interpretation None      Results for orders placed or performed during  the hospital encounter of 12/16/14  Lipase, blood  Result Value Ref Range   Lipase 33 22 - 51 U/L  Comprehensive metabolic panel  Result Value Ref Range   Sodium 140 135 - 145 mmol/L   Potassium 4.2 3.5 - 5.1 mmol/L   Chloride 103 101 - 111 mmol/L   CO2 28 22 - 32 mmol/L   Glucose, Bld 103 (H) 65 - 99 mg/dL   BUN 12 6 - 20 mg/dL   Creatinine, Ser 0.82 0.44 - 1.00 mg/dL   Calcium 9.8 8.9 - 10.3 mg/dL   Total Protein 8.0 6.5 - 8.1 g/dL   Albumin 3.8 3.5 - 5.0 g/dL   AST 23 15 - 41 U/L   ALT 16 14 - 54 U/L   Alkaline Phosphatase 57 38 - 126 U/L   Total Bilirubin 0.4 0.3 - 1.2 mg/dL   GFR calc non Af Amer >60 >60 mL/min   GFR calc Af Amer >60 >60 mL/min   Anion gap 9 5 - 15  CBC  Result Value Ref Range   WBC 5.1 4.0 - 10.5 K/uL   RBC 4.43 3.87 - 5.11 MIL/uL   Hemoglobin 13.0 12.0 - 15.0 g/dL   HCT 39.7 36.0 - 46.0 %   MCV 89.6 78.0 - 100.0 fL   MCH 29.3 26.0 - 34.0 pg   MCHC 32.7 30.0 - 36.0 g/dL   RDW 12.7 11.5 - 15.5 %   Platelets 314 150 - 400 K/uL  Urinalysis, Routine w reflex microscopic (not at [facility])  Result Value Ref Range   Color, Urine YELLOW YELLOW   APPearance CLOUDY (A) CLEAR   Specific Gravity, Urine 1.015 1.005 - 1.030   pH 5.0 5.0 - 8.0   Glucose, UA NEGATIVE NEGATIVE mg/dL   Hgb urine dipstick NEGATIVE NEGATIVE   Bilirubin Urine NEGATIVE NEGATIVE   Ketones, ur NEGATIVE NEGATIVE mg/dL   Protein, ur NEGATIVE NEGATIVE mg/dL   Urobilinogen, UA 0.2 0.0 - 1.0 mg/dL   Nitrite NEGATIVE NEGATIVE   Leukocytes, UA MODERATE (A) NEGATIVE  Urine microscopic-add on  Result Value Ref Range   Squamous Epithelial / LPF FEW (A) RARE   WBC, UA 3-6 <3 WBC/hpf   Bacteria, UA RARE RARE   No results found.   MDM  Plan avoid dairy. Stop MiraLAX. Blood pressure recheck one to 2 weeks. Follow up with Dr.Perini as needed Abdominal pain is felt to be nonspecific. Diagnosis #1 diarrhea #2 nonspecific abdominal pain #3 hypertension Final diagnoses:  None        Orlie Dakin, MD 12/16/14 2114  Orlie Dakin, MD 12/16/14 2115

## 2014-12-16 NOTE — Discharge Instructions (Signed)
Avoid milk or foods containing milk such as cheese or ice cream all having diarrhea. Continue to stay away from MiraLAX, as MiraLAX contributes to diarrhea. Take Tylenol as directed for pain. Call Dr. Joylene Draft to schedule an appointment if you don't continue to feel better in 2 or 3 days. Return if you feel worse for any reason. Your blood pressure should be rechecked within the next one or 2 weeks. Today's was elevated at 173/69

## 2014-12-21 DIAGNOSIS — Z6823 Body mass index (BMI) 23.0-23.9, adult: Secondary | ICD-10-CM | POA: Diagnosis not present

## 2014-12-21 DIAGNOSIS — R109 Unspecified abdominal pain: Secondary | ICD-10-CM | POA: Diagnosis not present

## 2014-12-21 DIAGNOSIS — R197 Diarrhea, unspecified: Secondary | ICD-10-CM | POA: Diagnosis not present

## 2014-12-26 ENCOUNTER — Encounter: Payer: Self-pay | Admitting: Physician Assistant

## 2014-12-26 ENCOUNTER — Ambulatory Visit (INDEPENDENT_AMBULATORY_CARE_PROVIDER_SITE_OTHER): Payer: Commercial Managed Care - HMO | Admitting: Physician Assistant

## 2014-12-26 VITALS — BP 120/66 | HR 100 | Ht 65.0 in | Wt 134.6 lb

## 2014-12-26 DIAGNOSIS — R109 Unspecified abdominal pain: Secondary | ICD-10-CM | POA: Diagnosis not present

## 2014-12-26 DIAGNOSIS — R197 Diarrhea, unspecified: Secondary | ICD-10-CM | POA: Diagnosis not present

## 2014-12-26 DIAGNOSIS — K5731 Diverticulosis of large intestine without perforation or abscess with bleeding: Secondary | ICD-10-CM | POA: Diagnosis not present

## 2014-12-26 DIAGNOSIS — E039 Hypothyroidism, unspecified: Secondary | ICD-10-CM | POA: Insufficient documentation

## 2014-12-26 DIAGNOSIS — E038 Other specified hypothyroidism: Secondary | ICD-10-CM

## 2014-12-26 DIAGNOSIS — E785 Hyperlipidemia, unspecified: Secondary | ICD-10-CM

## 2014-12-26 MED ORDER — METRONIDAZOLE 250 MG PO TABS: ORAL_TABLET | ORAL | Status: DC

## 2014-12-26 MED ORDER — HYOSCYAMINE SULFATE 0.125 MG SL SUBL: 0.1250 mg | SUBLINGUAL_TABLET | Freq: Four times a day (QID) | SUBLINGUAL | Status: DC | PRN

## 2014-12-26 MED ORDER — SACCHAROMYCES BOULARDII 250 MG PO CAPS: 250.0000 mg | ORAL_CAPSULE | Freq: Two times a day (BID) | ORAL | Status: DC

## 2014-12-26 NOTE — Progress Notes (Signed)
Patient ID: Sarah Manning, female   DOB: 01-29-1925, 81 y.o.   MRN: [id number]   Subjective:    Patient ID: Sarah Manning, female    DOB: 1925-03-29, 81 y.o.   MRN: [id number]  HPI Sarah Manning is a very nice 81 year old white female referred today by Dr. Joylene Draft for evaluation of diarrhea and abdominal pain. She is known remotely to Dr. Henrene Pastor from colonoscopy done in March 2005. She was noted to have one 4 mm sessile polyp and diverticulosis as well as internal hemorrhoids. She had also undergone upper endoscopy in 2008 when she was hospitalized with anemia and this was a normal exam.  Patient is generally in good health and is hoping to take a trip to [place] in mid August to visit family. She says her current symptoms started about 6 weeks ago with diarrhea and crampy abdominal pain as well as noting in her lower abdomen. She says all of her stools are watery to liquid and she's having at least 4-5 bowel movements every day. Stools are nonbloody. She has had some mild queasiness but no nausea or vomiting. Her weight has been stable. She says she is eating light because she is "scared to eat" and afraid or aggravate her symptoms. She has had urgency but no incontinence. No fever chills or sweats. Should not had any new medications travel recent antibiotics etc. She had an ER visit in June with abdominal pain when her symptoms started and CT of the abdomen and pelvis was done at that time this did show gallstones and a 5 mm cyst in the body of the pancreas, colonic diverticulosis and otherwise negative exam. She has been given a trial of Florastor  and hyoscyamine but says symptoms are persisting.  Review of Systems Pertinent positive and negative review of systems were noted in the above HPI section.  All other review of systems was otherwise negative.  Outpatient Encounter Prescriptions as of 12/26/2014  Medication Sig  . CRESTOR 5 MG tablet Take 5 mg by mouth at bedtime.   . hyoscyamine (LEVSIN SL)  0.125 MG SL tablet Take 1 tablet (0.125 mg total) by mouth every 6 (six) hours as needed.  Marland Kitchen lisinopril (PRINIVIL,ZESTRIL) 20 MG tablet Take 20 mg by mouth daily.  Marland Kitchen LORazepam (ATIVAN) 0.5 MG tablet   . LYRICA 50 MG capsule Take 50 mg by mouth at bedtime.   . saccharomyces boulardii (FLORASTOR) 250 MG capsule Take 1 capsule (250 mg total) by mouth 2 (two) times daily.  Marland Kitchen SYNTHROID 125 MCG tablet Take 125 mcg by mouth daily.  . [DISCONTINUED] hyoscyamine (LEVSIN SL) 0.125 MG SL tablet   . [DISCONTINUED] saccharomyces boulardii (FLORASTOR) 250 MG capsule Take 250 mg by mouth 2 (two) times daily.  . metroNIDAZOLE (FLAGYL) 250 MG tablet Take 1 tab by mouth, 4 times daily with food.  . ondansetron (ZOFRAN) 4 MG tablet Take 1 tablet (4 mg total) by mouth every 6 (six) hours. (Patient not taking: Reported on 12/26/2014)   No facility-administered encounter medications on file as of 12/26/2014.   Allergies  Allergen Reactions  . Aspirin Other (See Comments)    Excessive blood thinning  . Amoxicillin Itching and Rash  . Penicillins Itching and Rash   Patient Active Problem List   Diagnosis Date Noted  . Diverticulosis of colon without hemorrhage 12/26/2014  . Hyperlipidemia 12/26/2014  . Hypothyroidism 12/26/2014   History   Social History  . Marital Status: Married    Spouse Name: Sarah Manning  .  Number of Children: 2  . Years of Education: Sarah Manning   Occupational History  . Retired    Social History Main Topics  . Smoking status: Never Smoker   . Smokeless tobacco: Never Used  . Alcohol Use: No  . Drug Use: No  . Sexual Activity: Not on file   Other Topics Concern  . Not on file   Social History Narrative    Sarah Manning's family history includes Heart disease in her father.      Objective:    Filed Vitals:   12/26/14 0944  BP: 120/66  Pulse: 100    Physical Exam   well-developed elderly white female in no acute distress, quite pleasant blood pressure 120/66 pulse 100 height 5  foot 5 weight 134. HEENT; nontraumatic normocephalic EOMI PERRLA sclera anicteric, Supple ;no JVD, Cardiovascular ;regular rate and rhythm with S1-S2 no murmur rub or gallop, Pulmonary ;clear bilaterally, Abdomen; soft, nondistended, nontender bowel sounds are active is no palpable mass or hepatosplenomegaly, Rectal ;exam not done, Extremities; no clubbing cyanosis or edema skin warm and dry, Neuropsych ;mood and affect appropriate      Assessment & Plan:   #1 81 yo female with 6 week hx of Diarrhea and abdominal cramping, bloating- etiology not clear. Ct scan in June 2016 unrevealing- suspect infectious-r/o cdiff #2 hypothyroid #3 hyperlipidemia  Plan; Gi pathogen panel  Continue Florastor BID x one month  Refill hyoscyamine for prn use q 4-6 hours Add empiric course of flagyl 250 mg po 4 times daily x 14 days If sxs persist after above will re-evaluate in office and consider colonoscopy   Sarah Neyer Genia Harold PA-C 12/26/2014   Cc: Sarah Infante, MD

## 2014-12-26 NOTE — Progress Notes (Signed)
Agree with initial assessment and plans 

## 2014-12-26 NOTE — Patient Instructions (Signed)
We sent prescription refills to [facility]. [address]  1. Flagyl ( Metronidazole)  250 mg 2. Florastor 3. Levsin Sublinguel

## 2014-12-27 ENCOUNTER — Other Ambulatory Visit: Payer: Commercial Managed Care - HMO

## 2014-12-27 ENCOUNTER — Telehealth: Payer: Self-pay | Admitting: Physician Assistant

## 2014-12-27 DIAGNOSIS — R109 Unspecified abdominal pain: Secondary | ICD-10-CM

## 2014-12-27 DIAGNOSIS — R197 Diarrhea, unspecified: Secondary | ICD-10-CM | POA: Diagnosis not present

## 2014-12-28 ENCOUNTER — Emergency Department (HOSPITAL_COMMUNITY)
Admission: EM | Admit: 2014-12-28 | Discharge: 2014-12-29 | Disposition: A | Discharge status: Home / Self Care | Payer: Commercial Managed Care - HMO | Attending: Emergency Medicine | Admitting: Emergency Medicine

## 2014-12-28 ENCOUNTER — Emergency Department (HOSPITAL_COMMUNITY): Payer: Commercial Managed Care - HMO

## 2014-12-28 ENCOUNTER — Encounter (HOSPITAL_COMMUNITY): Payer: Self-pay | Admitting: *Deleted

## 2014-12-28 DIAGNOSIS — R109 Unspecified abdominal pain: Secondary | ICD-10-CM

## 2014-12-28 DIAGNOSIS — Z79899 Other long term (current) drug therapy: Secondary | ICD-10-CM | POA: Insufficient documentation

## 2014-12-28 DIAGNOSIS — R1030 Lower abdominal pain, unspecified: Secondary | ICD-10-CM | POA: Diagnosis not present

## 2014-12-28 DIAGNOSIS — Z880 Allergy status to penicillin: Secondary | ICD-10-CM | POA: Diagnosis not present

## 2014-12-28 DIAGNOSIS — R197 Diarrhea, unspecified: Secondary | ICD-10-CM | POA: Diagnosis not present

## 2014-12-28 DIAGNOSIS — I10 Essential (primary) hypertension: Secondary | ICD-10-CM | POA: Insufficient documentation

## 2014-12-28 DIAGNOSIS — K8020 Calculus of gallbladder without cholecystitis without obstruction: Secondary | ICD-10-CM | POA: Diagnosis not present

## 2014-12-28 LAB — GASTROINTESTINAL PATHOGEN PANEL PCR
C. DIFFICILE TOX A/B, PCR: NEGATIVE
Campylobacter, PCR: NEGATIVE
Cryptosporidium, PCR: NEGATIVE
E coli (ETEC) LT/ST PCR: NEGATIVE
E coli (STEC) stx1/stx2, PCR: NEGATIVE
E coli 0157, PCR: NEGATIVE
GIARDIA LAMBLIA, PCR: NEGATIVE
Norovirus, PCR: NEGATIVE
Rotavirus A, PCR: NEGATIVE
SALMONELLA, PCR: NEGATIVE
Shigella, PCR: NEGATIVE

## 2014-12-28 LAB — CBC
HEMATOCRIT: 37.4 % (ref 36.0–46.0)
HEMOGLOBIN: 12.2 g/dL (ref 12.0–15.0)
MCH: 28.5 pg (ref 26.0–34.0)
MCHC: 32.6 g/dL (ref 30.0–36.0)
MCV: 87.4 fL (ref 78.0–100.0)
Platelets: 288 10*3/uL (ref 150–400)
RBC: 4.28 MIL/uL (ref 3.87–5.11)
RDW: 12.8 % (ref 11.5–15.5)
WBC: 4.1 10*3/uL (ref 4.0–10.5)

## 2014-12-28 LAB — COMPREHENSIVE METABOLIC PANEL
ALBUMIN: 3.9 g/dL (ref 3.5–5.0)
ALT: 20 U/L (ref 14–54)
ANION GAP: 8 (ref 5–15)
AST: 31 U/L (ref 15–41)
Alkaline Phosphatase: 55 U/L (ref 38–126)
BILIRUBIN TOTAL: 0.9 mg/dL (ref 0.3–1.2)
BUN: 8 mg/dL (ref 6–20)
CALCIUM: 9.4 mg/dL (ref 8.9–10.3)
CHLORIDE: 104 mmol/L (ref 101–111)
CO2: 27 mmol/L (ref 22–32)
Creatinine, Ser: 0.86 mg/dL (ref 0.44–1.00)
GFR calc Af Amer: >60 mL/min (ref >60)
GFR, EST NON AFRICAN AMERICAN: 59 mL/min — AB (ref >60)
Glucose, Bld: 115 mg/dL — ABNORMAL HIGH (ref 65–99)
Potassium: 3.9 mmol/L (ref 3.5–5.1)
Sodium: 139 mmol/L (ref 135–145)
Total Protein: 7.4 g/dL (ref 6.5–8.1)

## 2014-12-28 LAB — URINALYSIS, ROUTINE W REFLEX MICROSCOPIC
Bilirubin Urine: NEGATIVE
Glucose, UA: NEGATIVE mg/dL
Hgb urine dipstick: NEGATIVE
KETONES UR: NEGATIVE mg/dL
Leukocytes, UA: NEGATIVE
Nitrite: NEGATIVE
PROTEIN: NEGATIVE mg/dL
SPECIFIC GRAVITY, URINE: 1.008 (ref 1.005–1.030)
Urobilinogen, UA: 0.2 mg/dL (ref 0.0–1.0)
pH: 5 (ref 5.0–8.0)

## 2014-12-28 LAB — LIPASE, BLOOD: Lipase: 21 U/L — ABNORMAL LOW (ref 22–51)

## 2014-12-28 LAB — I-STAT CG4 LACTIC ACID, ED: Lactic Acid, Venous: 0.57 mmol/L (ref 0.5–2.0)

## 2014-12-28 MED ORDER — IOHEXOL 300 MG/ML  SOLN
100.0000 mL | Freq: Once | INTRAMUSCULAR | Status: AC | PRN
  Administered 2014-12-28: 100 mL via INTRAVENOUS

## 2014-12-28 NOTE — ED Notes (Signed)
Taken for CT scan at this time. 

## 2014-12-28 NOTE — ED Provider Notes (Signed)
CSN: [id number]     Arrival date & time 12/28/14  1741 History   First MD Initiated Contact with Patient 12/28/14 2016     Chief Complaint  Patient presents with  . Abdominal Pain   (Consider location/radiation/quality/duration/timing/severity/associated sxs/prior Treatment) HPI  Pt is an 81 yo female with a hx of HTN and recent GI issue presenting for abdominal pain.  Reports that over the last several weeks has had lower GI pain with intermittent diarrhea.  Was given abx previously and told by Dr. Henrene Pastor with gastroenterology possible bacterial overgrowth and prescribed flagyl.  This medication hurt her stomach and has stopped taking.  Reports it is a lower abdominal pain that is crampy in nature and intermittent.  She is afraid to eat due to the pain but not necessarily exacerbated by eating.  Non-radiating and 6/10 at its worst.  Had exacerbation of pain this evening with no n/v or diarrhea and attempted to call GI office.  No answer and came to the ED.   Past Medical History  Diagnosis Date  . Hypertension    History reviewed. No pertinent past surgical history. Family History  Problem Relation Age of Onset  . Heart disease Father    History  Substance Use Topics  . Smoking status: Never Smoker   . Smokeless tobacco: Never Used  . Alcohol Use: No   OB History    No data available     Review of Systems  Constitutional: Negative for fever and chills.  HENT: Negative for congestion and sore throat.   Eyes: Negative for pain.  Respiratory: Negative for cough and shortness of breath.   Cardiovascular: Negative for chest pain and palpitations.  Gastrointestinal: Positive for abdominal pain and diarrhea. Negative for nausea and vomiting.  Genitourinary: Negative for dysuria, hematuria, flank pain, vaginal bleeding, vaginal discharge, vaginal pain and pelvic pain.  Musculoskeletal: Negative for back pain and neck pain.  Skin: Negative for rash.  Allergic/Immunologic: Negative.    Neurological: Negative for dizziness and light-headedness.  Psychiatric/Behavioral: Negative for confusion.      Allergies  Aspirin; Amoxicillin; and Penicillins  Home Medications   Prior to Admission medications   Medication Sig Start Date End Date Taking? Authorizing Provider  CRESTOR 5 MG tablet Take 5 mg by mouth at bedtime.  10/31/14  Yes Historical Provider, MD  hyoscyamine (LEVSIN SL) 0.125 MG SL tablet Take 1 tablet (0.125 mg total) by mouth every 6 (six) hours as needed. Patient taking differently: Take 0.125 mg by mouth every 6 (six) hours as needed for cramping.  12/26/14  Yes Amy S Esterwood, PA-C  lisinopril (PRINIVIL,ZESTRIL) 20 MG tablet Take 20 mg by mouth daily. 10/11/14  Yes Historical Provider, MD  LORazepam (ATIVAN) 0.5 MG tablet Take 0.5 mg by mouth every 4 (four) hours as needed for anxiety.  12/19/14  Yes Historical Provider, MD  LYRICA 50 MG capsule Take 50 mg by mouth at bedtime.  10/03/14  Yes Historical Provider, MD  metroNIDAZOLE (FLAGYL) 250 MG tablet Take 1 tab by mouth, 4 times daily with food. Patient taking differently: Take 250 mg by mouth 4 (four) times daily.  12/26/14  Yes Amy S Esterwood, PA-C  saccharomyces boulardii (FLORASTOR) 250 MG capsule Take 1 capsule (250 mg total) by mouth 2 (two) times daily. 12/26/14  Yes Amy S Esterwood, PA-C  SYNTHROID 125 MCG tablet Take 125 mcg by mouth daily. 09/22/14  Yes Historical Provider, MD  ondansetron (ZOFRAN) 4 MG tablet Take 1 tablet (4 mg total)  by mouth every 6 (six) hours. 12/29/14   Geronimo Boot, MD   BP 118/87 mmHg  Pulse 67  Temp(Src) 97.8 F (36.6 C) (Oral)  Resp 16  SpO2 100% Physical Exam  Constitutional: She is oriented to person, place, and time. She appears well-developed and well-nourished. No distress.  HENT:  Head: Normocephalic and atraumatic.  Eyes: Conjunctivae and EOM are normal. Pupils are equal, round, and reactive to light.  Neck: Normal range of motion. Neck supple.   Cardiovascular: Normal rate, regular rhythm and normal heart sounds.   Pulses:      Radial pulses are 2+ on the right side, and 2+ on the left side.       Dorsalis pedis pulses are 2+ on the right side, and 2+ on the left side.  Pulmonary/Chest: Effort normal and breath sounds normal. No respiratory distress.  Abdominal: Soft. Bowel sounds are normal. There is tenderness. There is no rigidity, no rebound, no guarding, no CVA tenderness, no tenderness at McBurney's point and negative Murphy's sign.  Very mild TTP over lower abdomen.   Musculoskeletal: Normal range of motion.  Neurological: She is alert and oriented to person, place, and time. She has normal reflexes. No cranial nerve deficit.  Skin: Skin is warm and dry. She is not diaphoretic.  Psychiatric: She has a normal mood and affect.    ED Course  Procedures (including critical care time) Labs Review Labs Reviewed  LIPASE, BLOOD - Abnormal; Notable for the following:    Lipase 21 (*)    All other components within normal limits  COMPREHENSIVE METABOLIC PANEL - Abnormal; Notable for the following:    Glucose, Bld 115 (*)    GFR calc non Af Amer 59 (*)    All other components within normal limits  CBC  URINALYSIS, ROUTINE W REFLEX MICROSCOPIC (NOT AT [facility])  I-STAT CG4 LACTIC ACID, ED    Imaging Review Ct Abdomen Pelvis W Contrast  12/28/2014   CLINICAL DATA:  Lower abdominal pain for 1 month with nausea. Diarrhea for 2 weeks.  EXAM: CT ABDOMEN AND PELVIS WITH CONTRAST  TECHNIQUE: Multidetector CT imaging of the abdomen and pelvis was performed using the standard protocol following bolus administration of intravenous contrast.  CONTRAST:  165mL OMNIPAQUE IOHEXOL 300 MG/ML  SOLN  COMPARISON:  CT 11/06/2014  FINDINGS: Lower chest:  Minimal scarring in the lung bases.  No consolidation.  Hepatobiliary: Cholelithiasis without pericholecystic inflammatory change. No biliary dilatation. No focal hepatic lesion.  Pancreas: The 5 mm  benign-appearing cyst in the pancreatic body is unchanged. An additional 5 mm cyst at the pancreatic neck, also with no suspicious characteristics. No ductal dilatation.  Spleen: Tiny subcentimeter hypodensities, unchanged. Normal in size.  Adrenals/Urinary Tract: No hydronephrosis. Symmetric renal enhancement. No localizing abnormality. The adrenal glands are normal.  Stomach/Bowel: Small hiatal hernia. Stomach is physiologically distended. There are no dilated or thickened bowel loops. The entire colon is well opacified with oral contrast. Distal colonic diverticulosis without diverticulitis. No colonic wall thickening.  Vascular/Lymphatic: Atherosclerotic abdominal aorta without aneurysm. No retroperitoneal, mesenteric or pelvic adenopathy.  Reproductive: Uterus is atrophic, normal for age. The ovaries are quiescent. No adnexal mass.  Other: 1 cm nonenhancing low-density structure in the retroperitoneum adjacent to the ligament of Treitz is unchanged. No ascites.  Musculoskeletal: Bones under mineralized. Degenerative change in the spine. Remote left pubic rami fractures. There are no acute or suspicious osseous abnormalities.  IMPRESSION: 1. No acute abnormality in the abdomen/pelvis. 2. Stable chronic findings  include cholelithiasis and diverticulosis.   Electronically Signed   By: Jeb Levering M.D.   On: 12/28/2014 23:02     EKG Interpretation None      MDM   Final diagnoses:  Abdominal pain, unspecified abdominal location    On evaluation pt HDS in NAD.  Reports pan has greatly decreased on arrival.  No leukocytosis or anemia.  UA with no signs of infection or blood.  Pt given zofran in ED with mild symptomatic relief.  Pt appears very comfortable and able to stand and get on stretcher easily.  Palpation of abdomen with only mild pain.  Lipase WNL and LFTs with no concerning findings.  CT abdomen with no acute abnormalities of abdomen or pelvis.  Pt afebrile with no tachycardia and doubt  infectious source.  Doubt appendicitis, uti, pyelonephritis, diverticulitis, mesenteric ischemia, nephrolithiasis, ovarian torsion, AAA, TOA, or PID. Advised to f/u with gastroenterologist as soon as possible to attempt further diagnostics and determine etiology of pain.   If performed, labs, EKGs, and imaging were reviewed/interpreted by myself and my attending and incorporated into medical decision making.  Discussed pertinent finding with patient or caregiver prior to discharge with no further questions.  Immediate return precautions given and pt or caregiver reports understanding.  Pt care supervised by my attending Dr. Wyvonnia Dusky.   Geronimo Boot, MD PGY-2  Emergency Medicine     Geronimo Boot, MD 12/29/14 2025  Ezequiel Essex, MD 12/30/14 [phone]

## 2014-12-28 NOTE — ED Notes (Signed)
Pt reports abdominal pain n/v and generalized weakness that has been ongoing for over 2 weeks. Pt reports seeing a GI doctor as well as being seen here. Pt states that she has had no relief.

## 2014-12-28 NOTE — Discharge Instructions (Signed)
Abdominal Pain, Women °Abdominal (stomach, pelvic, or belly) pain can be caused by many things. It is important to tell your doctor: °· The location of the pain. °· Does it come and go or is it present all the time? °· Are there things that start the pain (eating certain foods, exercise)? °· Are there other symptoms associated with the pain (fever, nausea, vomiting, diarrhea)? °All of this is helpful to know when trying to find the cause of the pain. °CAUSES  °· Stomach: virus or bacteria infection, or ulcer. °· Intestine: appendicitis (inflamed appendix), regional ileitis (Crohn's disease), ulcerative colitis (inflamed colon), irritable bowel syndrome, diverticulitis (inflamed diverticulum of the colon), or cancer of the stomach or intestine. °· Gallbladder disease or stones in the gallbladder. °· Kidney disease, kidney stones, or infection. °· Pancreas infection or cancer. °· Fibromyalgia (pain disorder). °· Diseases of the female organs: °¨ Uterus: fibroid (non-cancerous) tumors or infection. °¨ Fallopian tubes: infection or tubal pregnancy. °¨ Ovary: cysts or tumors. °¨ Pelvic adhesions (scar tissue). °¨ Endometriosis (uterus lining tissue growing in the pelvis and on the pelvic organs). °¨ Pelvic congestion syndrome (female organs filling up with blood just before the menstrual period). °¨ Pain with the menstrual period. °¨ Pain with ovulation (producing an egg). °¨ Pain with an IUD (intrauterine device, birth control) in the uterus. °¨ Cancer of the female organs. °· Functional pain (pain not caused by a disease, may improve without treatment). °· Psychological pain. °· Depression. °DIAGNOSIS  °Your doctor will decide the seriousness of your pain by doing an examination. °· Blood tests. °· X-rays. °· Ultrasound. °· CT scan (computed tomography, special type of X-ray). °· MRI (magnetic resonance imaging). °· Cultures, for infection. °· Barium enema (dye inserted in the large intestine, to better view it with  X-rays). °· Colonoscopy (looking in intestine with a lighted tube). °· Laparoscopy (minor surgery, looking in abdomen with a lighted tube). °· Major abdominal exploratory surgery (looking in abdomen with a large incision). °TREATMENT  °The treatment will depend on the cause of the pain.  °· Many cases can be observed and treated at home. °· Over-the-counter medicines recommended by your caregiver. °· Prescription medicine. °· Antibiotics, for infection. °· Birth control pills, for painful periods or for ovulation pain. °· Hormone treatment, for endometriosis. °· Nerve blocking injections. °· Physical therapy. °· Antidepressants. °· Counseling with a psychologist or psychiatrist. °· Minor or major surgery. °HOME CARE INSTRUCTIONS  °· Do not take laxatives, unless directed by your caregiver. °· Take over-the-counter pain medicine only if ordered by your caregiver. Do not take aspirin because it can cause an upset stomach or bleeding. °· Try a clear liquid diet (broth or water) as ordered by your caregiver. Slowly move to a bland diet, as tolerated, if the pain is related to the stomach or intestine. °· Have a thermometer and take your temperature several times a day, and record it. °· Bed rest and sleep, if it helps the pain. °· Avoid sexual intercourse, if it causes pain. °· Avoid stressful situations. °· Keep your follow-up appointments and tests, as your caregiver orders. °· If the pain does not go away with medicine or surgery, you may try: °¨ Acupuncture. °¨ Relaxation exercises (yoga, meditation). °¨ Group therapy. °¨ Counseling. °SEEK MEDICAL CARE IF:  °· You notice certain foods cause stomach pain. °· Your home care treatment is not helping your pain. °· You need stronger pain medicine. °· You want your IUD removed. °· You feel faint or   lightheaded. °· You develop nausea and vomiting. °· You develop a rash. °· You are having side effects or an allergy to your medicine. °SEEK IMMEDIATE MEDICAL CARE IF:  °· Your  pain does not go away or gets worse. °· You have a fever. °· Your pain is felt only in portions of the abdomen. The right side could possibly be appendicitis. The left lower portion of the abdomen could be colitis or diverticulitis. °· You are passing blood in your stools (bright red or black tarry stools, with or without vomiting). °· You have blood in your urine. °· You develop chills, with or without a fever. °· You pass out. °MAKE SURE YOU:  °· Understand these instructions. °· Will watch your condition. °· Will get help right away if you are not doing well or get worse. °Document Released: 03/16/2007 Document Revised: 10/03/2013 Document Reviewed: 04/05/2009 °[employer]® Patient Information ©2015 [employer]. This information is not intended to replace advice given to you by your health care provider. Make sure you discuss any questions you have with your health care provider. ° °

## 2014-12-29 DIAGNOSIS — R1030 Lower abdominal pain, unspecified: Secondary | ICD-10-CM | POA: Diagnosis not present

## 2014-12-29 DIAGNOSIS — R197 Diarrhea, unspecified: Secondary | ICD-10-CM | POA: Diagnosis not present

## 2014-12-29 DIAGNOSIS — Z880 Allergy status to penicillin: Secondary | ICD-10-CM | POA: Diagnosis not present

## 2014-12-29 DIAGNOSIS — I10 Essential (primary) hypertension: Secondary | ICD-10-CM | POA: Diagnosis not present

## 2014-12-29 DIAGNOSIS — Z79899 Other long term (current) drug therapy: Secondary | ICD-10-CM | POA: Diagnosis not present

## 2014-12-29 MED ORDER — ONDANSETRON HCL 4 MG PO TABS
4.0000 mg | ORAL_TABLET | Freq: Once | ORAL | Status: AC
  Administered 2014-12-29: 4 mg via ORAL
  Filled 2014-12-29: qty 1

## 2014-12-29 MED ORDER — ONDANSETRON HCL 4 MG PO TABS: 4.0000 mg | ORAL_TABLET | Freq: Four times a day (QID) | ORAL | Status: DC

## 2014-12-29 NOTE — Telephone Encounter (Signed)
12/27/2014 05:38 PM Phone (Outgoing) Hammac, Shardea D (Self)  abdominal pain worse with flagyl- advised pt to stop flagyl,continue levsin q4-6 hours prn. Will wait for GI pathogen panel results   By Alfredia Ferguson, PA-C

## 2015-01-01 ENCOUNTER — Telehealth: Payer: Self-pay | Admitting: Physician Assistant

## 2015-01-01 DIAGNOSIS — R197 Diarrhea, unspecified: Secondary | ICD-10-CM | POA: Diagnosis not present

## 2015-01-01 DIAGNOSIS — R110 Nausea: Secondary | ICD-10-CM | POA: Diagnosis not present

## 2015-01-01 DIAGNOSIS — Z6823 Body mass index (BMI) 23.0-23.9, adult: Secondary | ICD-10-CM | POA: Diagnosis not present

## 2015-01-01 DIAGNOSIS — R109 Unspecified abdominal pain: Secondary | ICD-10-CM | POA: Diagnosis not present

## 2015-01-01 NOTE — Telephone Encounter (Signed)
Left message for Sarah Manning to call back. 

## 2015-01-01 NOTE — Telephone Encounter (Signed)
Pt scheduled to see Cecille Rubin Hvozdovic, PA-C tomorrow at 1:45pm. Misty to call and notify pt of appt.

## 2015-01-01 NOTE — Telephone Encounter (Signed)
Reviewed. Keep plans to see Cecille Rubin tomorrow. We will go from there.

## 2015-01-01 NOTE — Telephone Encounter (Signed)
Please let pt know the GI pathogen panel is negative , and that I saw that she went back to the ER and CT scan was negative- I think she needs a colonoscopy with Dr. Henrene Pastor - please schedule for asap.. Will also ask Dr Henrene Pastor to review workup etc so far and advise         JP- please review and advise.. She is 31 , but a good 89, and clearly not feeling well -went back to ER 7/28- could not tolerate an empric trial of flagyl- just on antispasmotic and probiotic

## 2015-01-01 NOTE — Telephone Encounter (Signed)
Dr Henrene Pastor, Amy Recommends colonoscopy. Do you agree? I went over patients results with her and explained we may call her back to schedule a colonoscopy.

## 2015-01-02 ENCOUNTER — Encounter: Payer: Self-pay | Admitting: Physician Assistant

## 2015-01-02 ENCOUNTER — Ambulatory Visit (INDEPENDENT_AMBULATORY_CARE_PROVIDER_SITE_OTHER): Payer: Commercial Managed Care - HMO | Admitting: Physician Assistant

## 2015-01-02 VITALS — BP 96/60 | HR 92 | Ht 65.0 in | Wt 133.0 lb

## 2015-01-02 DIAGNOSIS — K589 Irritable bowel syndrome without diarrhea: Secondary | ICD-10-CM

## 2015-01-02 DIAGNOSIS — R1013 Epigastric pain: Secondary | ICD-10-CM

## 2015-01-02 MED ORDER — RANITIDINE HCL 150 MG PO TABS: 150.0000 mg | ORAL_TABLET | Freq: Every day | ORAL | Status: DC

## 2015-01-02 NOTE — Patient Instructions (Signed)
We have sent the following medications to your pharmacy for you to pick up at your convenience: Ranitidine  Continue levsin  Follow a low residue diet  Please follow up with Amy on 01/15/2015 at 1:30pm

## 2015-01-02 NOTE — Progress Notes (Signed)
Patient ID: Sarah Manning, female   DOB: 09-23-1924, 81 y.o.   MRN: [id number]     History of Present Illness: Sarah Manning is a delightful 81 year old female who was last seen here on July 26 for diarrhea and abdominal pain. She was complaining of a 6 week history of diarrhea and crampy abdominal pain. Stool studies were obtained and were nonrevealing. She was seen in the emergency room in June with abdominal pain and had a CT of the abdomen and pelvis done that showed gallstones and a 5 mm cyst in the body of the pancreas, colonic diverticulosis and was otherwise negative exam. At her last visit she was given an empiric course of Flagyl 250 mg 4 times a day. She took it for several days and was unable to tolerate it and so she discontinued it. She was seen in the emergency room on July 28 and laboratory studies revealed no leukocytosis or anemia. UA had no signs of infection or blood. She was given Zofran with relief. She returns today stating that overall she feels much improved she has had no diarrhea since Saturday and had a small formed bowel movement Sunday and Monday with a small formed bowel movement this morning. She has not vomited and has much less nausea, but she states she feels full after a few bites. She has been belching frequently. She denies dysphagia. She has been drinking mainly liquids because she is not hungry. She's had no fever or chills or night sweats. I spoke with radiology earlier today regarding her CT scan. Dr. Reesa Chew reported some atherosclerosis but mesenteric vascular supply patent. No SMV occlusion.   Past Medical History  Diagnosis Date  . Hypertension     No past surgical history on file. Family History  Problem Relation Age of Onset  . Heart disease Father    History  Substance Use Topics  . Smoking status: Never Smoker   . Smokeless tobacco: Never Used  . Alcohol Use: No   Current Outpatient Prescriptions  Medication Sig Dispense Refill  . CRESTOR 5  MG tablet Take 5 mg by mouth at bedtime.     . hyoscyamine (LEVSIN SL) 0.125 MG SL tablet Take 1 tablet (0.125 mg total) by mouth every 6 (six) hours as needed. (Patient taking differently: Take 0.125 mg by mouth every 6 (six) hours as needed for cramping. ) 30 tablet 3  . lisinopril (PRINIVIL,ZESTRIL) 20 MG tablet Take 20 mg by mouth daily.    Marland Kitchen LORazepam (ATIVAN) 0.5 MG tablet Take 0.5 mg by mouth every 4 (four) hours as needed for anxiety.     Marland Kitchen LYRICA 50 MG capsule Take 50 mg by mouth at bedtime.     . ondansetron (ZOFRAN) 4 MG tablet Take 1 tablet (4 mg total) by mouth every 6 (six) hours. 4 tablet 0  . saccharomyces boulardii (FLORASTOR) 250 MG capsule Take 1 capsule (250 mg total) by mouth 2 (two) times daily. 60 capsule 1  . SYNTHROID 125 MCG tablet Take 125 mcg by mouth daily.    . ranitidine (ZANTAC) 150 MG tablet Take 1 tablet (150 mg total) by mouth at bedtime. 30 tablet 3   No current facility-administered medications for this visit.   Allergies  Allergen Reactions  . Aspirin Other (See Comments)    Excessive blood thinning  . Amoxicillin Itching and Rash  . Penicillins Itching and Rash      Review of Systems: Per history of present illness otherwise negative  LAB  RESULTS: Chem profile on July 28 alkaline phosphatase 55, lipase 21, AST 31, ALT 20, total bili 0.9 CBC on 12/28/2014 white count 4.1, hemoglobin 12.2, hematocrit 37.4, platelets 288,000. Stool pathogen panel on 12/27/2014 was negative for Campylobacter, C. difficile, Escherichia coli, salmonella, Shigella, rotavirus, rotavirus, Giardia, and cryptosporidium. Urinalysis on July 28 yellow, negative leukocytes, negative ketones, negative nitrates, negative hemoglobin, specific gravity 1.008. Studies:   Ct Abdomen Pelvis W Contrast  12/28/2014   CLINICAL DATA:  Lower abdominal pain for 1 month with nausea. Diarrhea for 2 weeks.  EXAM: CT ABDOMEN AND PELVIS WITH CONTRAST  TECHNIQUE: Multidetector CT imaging of the  abdomen and pelvis was performed using the standard protocol following bolus administration of intravenous contrast.  CONTRAST:  175mL OMNIPAQUE IOHEXOL 300 MG/ML  SOLN  COMPARISON:  CT 11/06/2014  FINDINGS: Lower chest:  Minimal scarring in the lung bases.  No consolidation.  Hepatobiliary: Cholelithiasis without pericholecystic inflammatory change. No biliary dilatation. No focal hepatic lesion.  Pancreas: The 5 mm benign-appearing cyst in the pancreatic body is unchanged. An additional 5 mm cyst at the pancreatic neck, also with no suspicious characteristics. No ductal dilatation.  Spleen: Tiny subcentimeter hypodensities, unchanged. Normal in size.  Adrenals/Urinary Tract: No hydronephrosis. Symmetric renal enhancement. No localizing abnormality. The adrenal glands are normal.  Stomach/Bowel: Small hiatal hernia. Stomach is physiologically distended. There are no dilated or thickened bowel loops. The entire colon is well opacified with oral contrast. Distal colonic diverticulosis without diverticulitis. No colonic wall thickening.  Vascular/Lymphatic: Atherosclerotic abdominal aorta without aneurysm. No retroperitoneal, mesenteric or pelvic adenopathy.  Reproductive: Uterus is atrophic, normal for age. The ovaries are quiescent. No adnexal mass.  Other: 1 cm nonenhancing low-density structure in the retroperitoneum adjacent to the ligament of Treitz is unchanged. No ascites.  Musculoskeletal: Bones under mineralized. Degenerative change in the spine. Remote left pubic rami fractures. There are no acute or suspicious osseous abnormalities.  IMPRESSION: 1. No acute abnormality in the abdomen/pelvis. 2. Stable chronic findings include cholelithiasis and diverticulosis.   Electronically Signed   By: Jeb Levering M.D.   On: 12/28/2014 23:02     Physical Exam: General: Pleasant, well developed elderly, Caucasian female in no acute distress Head: Normocephalic and atraumatic Eyes:  sclerae anicteric,  conjunctiva pink  Ears: Normal auditory acuity Lungs: Clear throughout to auscultation Heart: Regular rate and rhythm Abdomen: Soft, non distended, non-tender. No masses, no hepatomegaly. Normal bowel sounds Musculoskeletal: Symmetrical with no gross deformities  Extremities: No edema  Neurological: Alert oriented x 4, grossly nonfocal Psychological:  Alert and cooperative. Normal mood and affect  Assessment and Recommendations: 81 year old female status post a recent six-week bout of diarrhea and abdominal cramping and bloating her for follow-up. Etiology not clear, stool path panel negative. At this time as patient is feeling better we will continue a low residue, low-fat frequent feeding diet. She will continue Levsin. An antireflux regimen has been reviewed. She will be given an empiric trial of ranitidine 150 mg at at bedtime. She has requested close follow-up and so follow-up was scheduled in 2 weeks, however she was advised to call us sooner if her diarrhea recurs or her symptoms get worse. She plans on leaving for a vacation to [place] next week.        Merrick Maggio, Vita Barley PA-C 01/02/2015,

## 2015-01-02 NOTE — Progress Notes (Signed)
Workup reviewed. Agree with assessment and plans

## 2015-01-03 ENCOUNTER — Encounter: Payer: Self-pay | Admitting: Internal Medicine

## 2015-01-03 ENCOUNTER — Encounter: Payer: Self-pay | Admitting: Gastroenterology

## 2015-01-04 ENCOUNTER — Telehealth: Payer: Self-pay | Admitting: Physician Assistant

## 2015-01-05 MED ORDER — RIFAXIMIN 550 MG PO TABS
550.0000 mg | ORAL_TABLET | Freq: Three times a day (TID) | ORAL | Status: DC
Start: 2015-01-05 — End: 2015-01-08

## 2015-01-05 NOTE — Telephone Encounter (Signed)
Sarah Manning, You seen this patient on Tuesday. Patient stated she is hurting and in pain.......Marland KitchenWants to know if she can be scheduled for an Endoscopy with Dr Henrene Pastor and given something for pain for over the weekend

## 2015-01-05 NOTE — Telephone Encounter (Signed)
Per Cecille Rubin send in [place]. Sent to pharmacy. Patient aware

## 2015-01-05 NOTE — Telephone Encounter (Signed)
Recent CT was ok. If she is really having that much pain and nausea, she should go to ER. Otherwise will need f/u. Dr Henrene Pastor will be away, any urgent proc would need to be done on inpt basis.

## 2015-01-05 NOTE — Telephone Encounter (Signed)
Pateint stated she does not want to go to ED said she has been there 3 times and they have done nothing she stated "You all are the GI doctors". She wants antibiotic called in. Told her that she was unable to tolerate the flagyl like before so we would send in [place] to her pharmacy and see if her insurance would cover it.  Stressed the importance to her that if her pain worsened to please go to the ED. This may not be GI related.  She finally by the end of our conversation said she would consider going to the ER. But she wanted to be admitted by her PCP. She was going to contact Dr Beckie Salts office

## 2015-01-05 NOTE — Telephone Encounter (Signed)
Handled by Cecille Rubin

## 2015-01-05 NOTE — Addendum Note (Signed)
Addended by: Oda Kilts on: 01/05/2015 04:06 PM   Modules accepted: Orders

## 2015-01-07 ENCOUNTER — Encounter (HOSPITAL_COMMUNITY): Payer: Self-pay | Admitting: Emergency Medicine

## 2015-01-07 ENCOUNTER — Emergency Department (HOSPITAL_COMMUNITY)
Admission: EM | Admit: 2015-01-07 | Discharge: 2015-01-07 | Disposition: A | Discharge status: Home / Self Care | Payer: Commercial Managed Care - HMO | Attending: Emergency Medicine | Admitting: Emergency Medicine

## 2015-01-07 ENCOUNTER — Emergency Department (HOSPITAL_COMMUNITY): Payer: Commercial Managed Care - HMO

## 2015-01-07 DIAGNOSIS — R140 Abdominal distension (gaseous): Secondary | ICD-10-CM | POA: Insufficient documentation

## 2015-01-07 DIAGNOSIS — I10 Essential (primary) hypertension: Secondary | ICD-10-CM | POA: Diagnosis not present

## 2015-01-07 DIAGNOSIS — R21 Rash and other nonspecific skin eruption: Secondary | ICD-10-CM | POA: Diagnosis not present

## 2015-01-07 DIAGNOSIS — R61 Generalized hyperhidrosis: Secondary | ICD-10-CM | POA: Diagnosis not present

## 2015-01-07 DIAGNOSIS — I6789 Other cerebrovascular disease: Secondary | ICD-10-CM | POA: Diagnosis not present

## 2015-01-07 DIAGNOSIS — Z880 Allergy status to penicillin: Secondary | ICD-10-CM | POA: Insufficient documentation

## 2015-01-07 DIAGNOSIS — Z79899 Other long term (current) drug therapy: Secondary | ICD-10-CM | POA: Diagnosis not present

## 2015-01-07 DIAGNOSIS — R110 Nausea: Secondary | ICD-10-CM | POA: Diagnosis not present

## 2015-01-07 DIAGNOSIS — R251 Tremor, unspecified: Secondary | ICD-10-CM | POA: Diagnosis not present

## 2015-01-07 DIAGNOSIS — R6883 Chills (without fever): Secondary | ICD-10-CM | POA: Insufficient documentation

## 2015-01-07 LAB — COMPREHENSIVE METABOLIC PANEL
ALBUMIN: 3.6 g/dL (ref 3.5–5.0)
ALT: 15 U/L (ref 14–54)
ANION GAP: 7 (ref 5–15)
AST: 20 U/L (ref 15–41)
Alkaline Phosphatase: 45 U/L (ref 38–126)
BILIRUBIN TOTAL: 0.7 mg/dL (ref 0.3–1.2)
BUN: 17 mg/dL (ref 6–20)
CHLORIDE: 103 mmol/L (ref 101–111)
CO2: 30 mmol/L (ref 22–32)
Calcium: 9.2 mg/dL (ref 8.9–10.3)
Creatinine, Ser: 0.84 mg/dL (ref 0.44–1.00)
GFR calc Af Amer: >60 mL/min (ref >60)
GFR calc non Af Amer: >60 mL/min (ref >60)
Glucose, Bld: 97 mg/dL (ref 65–99)
Potassium: 4.8 mmol/L (ref 3.5–5.1)
Sodium: 140 mmol/L (ref 135–145)
Total Protein: 7.1 g/dL (ref 6.5–8.1)

## 2015-01-07 LAB — CBC WITH DIFFERENTIAL/PLATELET
Basophils Absolute: 0 10*3/uL (ref 0.0–0.1)
Basophils Relative: 0 % (ref 0–1)
Eosinophils Absolute: 0 10*3/uL (ref 0.0–0.7)
Eosinophils Relative: 0 % (ref 0–5)
HEMATOCRIT: 35.4 % — AB (ref 36.0–46.0)
Hemoglobin: 11.4 g/dL — ABNORMAL LOW (ref 12.0–15.0)
Lymphocytes Relative: 30 % (ref 12–46)
Lymphs Abs: 1.3 10*3/uL (ref 0.7–4.0)
MCH: 28.6 pg (ref 26.0–34.0)
MCHC: 32.2 g/dL (ref 30.0–36.0)
MCV: 88.9 fL (ref 78.0–100.0)
MONO ABS: 0.5 10*3/uL (ref 0.1–1.0)
MONOS PCT: 11 % (ref 3–12)
NEUTROS ABS: 2.5 10*3/uL (ref 1.7–7.7)
Neutrophils Relative %: 59 % (ref 43–77)
Platelets: 264 10*3/uL (ref 150–400)
RBC: 3.98 MIL/uL (ref 3.87–5.11)
RDW: 12.6 % (ref 11.5–15.5)
WBC: 4.2 10*3/uL (ref 4.0–10.5)

## 2015-01-07 LAB — URINALYSIS, ROUTINE W REFLEX MICROSCOPIC
BILIRUBIN URINE: NEGATIVE
Glucose, UA: NEGATIVE mg/dL
HGB URINE DIPSTICK: NEGATIVE
Ketones, ur: NEGATIVE mg/dL
Leukocytes, UA: NEGATIVE
NITRITE: NEGATIVE
PROTEIN: NEGATIVE mg/dL
Specific Gravity, Urine: 1.011 (ref 1.005–1.030)
Urobilinogen, UA: 0.2 mg/dL (ref 0.0–1.0)
pH: 6 (ref 5.0–8.0)

## 2015-01-07 LAB — I-STAT TROPONIN, ED: Troponin i, poc: 0 ng/mL (ref 0.00–0.08)

## 2015-01-07 LAB — LIPASE, BLOOD: LIPASE: 29 U/L (ref 22–51)

## 2015-01-07 MED ORDER — SODIUM CHLORIDE 0.9 % IV BOLUS (SEPSIS)
500.0000 mL | Freq: Once | INTRAVENOUS | Status: AC
  Administered 2015-01-07: 500 mL via INTRAVENOUS

## 2015-01-07 MED ORDER — SODIUM CHLORIDE 0.9 % IV SOLN
INTRAVENOUS | Status: DC
  Administered 2015-01-07: 75 mL/h via INTRAVENOUS

## 2015-01-07 NOTE — ED Notes (Signed)
Pt taken to x-ray and returned to room without distress noted. 

## 2015-01-07 NOTE — Discharge Instructions (Signed)
Return for any new or worse symptoms. Make an appointment to follow-up with your doctor. Continue the medicine the GI medicine has you on.

## 2015-01-07 NOTE — ED Provider Notes (Signed)
CSN: [id number]     Arrival date & time 01/07/15  0801 History   First MD Initiated Contact with Patient 01/07/15 [phone]     Chief Complaint  Patient presents with  . Tremors     (Consider location/radiation/quality/duration/timing/severity/associated sxs/prior Treatment) The history is provided by the patient and the EMS personnel.   patient brought in by EMS. Patient was sudden onset of feeling uncomfortable occurred shortly prior to arrival. Patient got up she felt weak she felt hot she had diaphoretic and she had chills or maybe tremors but sounds like could've been chills. Patient has long-standing abdominal GI issues but no significant belly pain today a little bit of distention no nausea no vomiting no diarrhea felt fine yesterday was out to dinner last evening. Patient also stated that she got red blotches on her skin all the symptoms have resolved upon arrival here. Patient's never had anything like this before. She was started on some new medicines but they've she's been taking them for a few days. No itching to the skin.  Past Medical History  Diagnosis Date  . Hypertension    History reviewed. No pertinent past surgical history. Family History  Problem Relation Age of Onset  . Heart disease Father    History  Substance Use Topics  . Smoking status: Never Smoker   . Smokeless tobacco: Never Used  . Alcohol Use: No   OB History    No data available     Review of Systems  Constitutional: Positive for chills and diaphoresis.  HENT: Negative for congestion.   Eyes: Negative for visual disturbance.  Respiratory: Negative for shortness of breath.   Cardiovascular: Negative for chest pain.  Gastrointestinal: Positive for abdominal distention. Negative for nausea, vomiting, abdominal pain and diarrhea.  Genitourinary: Negative for dysuria.  Musculoskeletal: Negative for back pain.  Skin: Positive for rash.  Neurological: Negative for dizziness, syncope and headaches.   Hematological: Does not bruise/bleed easily.  Psychiatric/Behavioral: Negative for confusion.      Allergies  Aspirin; Amoxicillin; and Penicillins  Home Medications   Prior to Admission medications   Medication Sig Start Date End Date Taking? Authorizing Provider  CRESTOR 5 MG tablet Take 5 mg by mouth at bedtime.  10/31/14  Yes Historical Provider, MD  hyoscyamine (LEVSIN SL) 0.125 MG SL tablet Take 1 tablet (0.125 mg total) by mouth every 6 (six) hours as needed. Patient taking differently: Take 0.125 mg by mouth every 6 (six) hours as needed for cramping.  12/26/14  Yes Amy S Esterwood, PA-C  lisinopril (PRINIVIL,ZESTRIL) 20 MG tablet Take 20 mg by mouth daily. 10/11/14  Yes Historical Provider, MD  LORazepam (ATIVAN) 0.5 MG tablet Take 0.5 mg by mouth every 4 (four) hours as needed for anxiety.  12/19/14  Yes Historical Provider, MD  LYRICA 50 MG capsule Take 50 mg by mouth at bedtime.  10/03/14  Yes Historical Provider, MD  ondansetron (ZOFRAN) 4 MG tablet Take 1 tablet (4 mg total) by mouth every 6 (six) hours. 12/29/14  Yes Geronimo Boot, MD  ranitidine (ZANTAC) 150 MG tablet Take 1 tablet (150 mg total) by mouth at bedtime. 01/02/15  Yes Lori P Hvozdovic, PA-C  saccharomyces boulardii (FLORASTOR) 250 MG capsule Take 1 capsule (250 mg total) by mouth 2 (two) times daily. 12/26/14  Yes Amy S Esterwood, PA-C  SYNTHROID 125 MCG tablet Take 125 mcg by mouth daily. 09/22/14  Yes Historical Provider, MD  rifaximin (XIFAXAN) 550 MG TABS tablet Take 1 tablet (550 mg  total) by mouth 3 (three) times daily. For 14 days Patient not taking: Reported on 01/07/2015 01/05/15   Lori P Hvozdovic, PA-C   BP 139/55 mmHg  Pulse 74  Temp(Src) 98.6 F (37 C) (Oral)  Resp 18  Ht 5\' 5"  (1.651 m)  Wt 130 lb (58.968 kg)  BMI 21.63 kg/m2  SpO2 96% Physical Exam  Constitutional: She is oriented to person, place, and time. She appears well-developed and well-nourished. No distress.  HENT:  Head: Normocephalic  and atraumatic.  Mouth/Throat: Oropharynx is clear and moist.  Eyes: Conjunctivae and EOM are normal. Pupils are equal, round, and reactive to light.  Neck: Normal range of motion. Neck supple.  Cardiovascular: Normal rate, regular rhythm and normal heart sounds.   No murmur heard. Pulmonary/Chest: Effort normal and breath sounds normal. No respiratory distress.  Abdominal: Soft. Bowel sounds are normal. There is no tenderness.  Musculoskeletal: Normal range of motion. She exhibits no edema.  Neurological: She is alert and oriented to person, place, and time. No cranial nerve deficit. She exhibits normal muscle tone. Coordination normal.  Skin: Skin is warm. No rash noted. No erythema.  Nursing note and vitals reviewed.   ED Course  Procedures (including critical care time) Labs Review Labs Reviewed  CBC WITH DIFFERENTIAL/PLATELET - Abnormal; Notable for the following:    Hemoglobin 11.4 (*)    HCT 35.4 (*)    All other components within normal limits  COMPREHENSIVE METABOLIC PANEL  LIPASE, BLOOD  URINALYSIS, ROUTINE W REFLEX MICROSCOPIC (NOT AT [facility])  I-STAT TROPOININ, ED   Results for orders placed or performed during the hospital encounter of 01/07/15  Comprehensive metabolic panel  Result Value Ref Range   Sodium 140 135 - 145 mmol/L   Potassium 4.8 3.5 - 5.1 mmol/L   Chloride 103 101 - 111 mmol/L   CO2 30 22 - 32 mmol/L   Glucose, Bld 97 65 - 99 mg/dL   BUN 17 6 - 20 mg/dL   Creatinine, Ser 0.84 0.44 - 1.00 mg/dL   Calcium 9.2 8.9 - 10.3 mg/dL   Total Protein 7.1 6.5 - 8.1 g/dL   Albumin 3.6 3.5 - 5.0 g/dL   AST 20 15 - 41 U/L   ALT 15 14 - 54 U/L   Alkaline Phosphatase 45 38 - 126 U/L   Total Bilirubin 0.7 0.3 - 1.2 mg/dL   GFR calc non Af Amer >60 >60 mL/min   GFR calc Af Amer >60 >60 mL/min   Anion gap 7 5 - 15  Lipase, blood  Result Value Ref Range   Lipase 29 22 - 51 U/L  CBC with Differential/Platelet  Result Value Ref Range   WBC 4.2 4.0 - 10.5 K/uL    RBC 3.98 3.87 - 5.11 MIL/uL   Hemoglobin 11.4 (L) 12.0 - 15.0 g/dL   HCT 35.4 (L) 36.0 - 46.0 %   MCV 88.9 78.0 - 100.0 fL   MCH 28.6 26.0 - 34.0 pg   MCHC 32.2 30.0 - 36.0 g/dL   RDW 12.6 11.5 - 15.5 %   Platelets 264 150 - 400 K/uL   Neutrophils Relative % 59 43 - 77 %   Neutro Abs 2.5 1.7 - 7.7 K/uL   Lymphocytes Relative 30 12 - 46 %   Lymphs Abs 1.3 0.7 - 4.0 K/uL   Monocytes Relative 11 3 - 12 %   Monocytes Absolute 0.5 0.1 - 1.0 K/uL   Eosinophils Relative 0 0 - 5 %  Eosinophils Absolute 0.0 0.0 - 0.7 K/uL   Basophils Relative 0 0 - 1 %   Basophils Absolute 0.0 0.0 - 0.1 K/uL  Urinalysis, Routine w reflex microscopic (not at [facility])  Result Value Ref Range   Color, Urine YELLOW YELLOW   APPearance CLEAR CLEAR   Specific Gravity, Urine 1.011 1.005 - 1.030   pH 6.0 5.0 - 8.0   Glucose, UA NEGATIVE NEGATIVE mg/dL   Hgb urine dipstick NEGATIVE NEGATIVE   Bilirubin Urine NEGATIVE NEGATIVE   Ketones, ur NEGATIVE NEGATIVE mg/dL   Protein, ur NEGATIVE NEGATIVE mg/dL   Urobilinogen, UA 0.2 0.0 - 1.0 mg/dL   Nitrite NEGATIVE NEGATIVE   Leukocytes, UA NEGATIVE NEGATIVE  I-Stat Troponin, ED (not at [facility])  Result Value Ref Range   Troponin i, poc 0.00 0.00 - 0.08 ng/mL   Comment 3             Imaging Review Dg Chest 2 View  01/07/2015   CLINICAL DATA:  81 year old female with a history of nausea and chills.  EXAM: CHEST - 2 VIEW  COMPARISON:  Plain film 08/20/2005, CT 08/26/2005, CT abdomen 12/28/2014  FINDINGS: Cardiomediastinal silhouette unchanged in size and contour. Atherosclerotic calcification of the aortic arch.  No evidence of pulmonary vascular congestion.  No confluent airspace disease. No pneumothorax or pleural effusion. Chronic interstitial opacities.  Calcifications of the abdominal aorta.  No displaced fracture.  IMPRESSION: No radiographic evidence of acute cardiopulmonary disease.  Atherosclerotic calcifications.  Signed,  Dulcy Fanny. Earleen Newport, DO  [facility]  [facility]   Electronically Signed   By: Corrie Mckusick D.O.   On: 01/07/2015 09:15     EKG Interpretation   Date/Time:  Sunday January 07 2015 08:46:50 EDT Ventricular Rate:  66 PR Interval:  176 QRS Duration: 73 QT Interval:  398 QTC Calculation: 417 R Axis:   63 Text Interpretation:  Sinus rhythm Abnormal R-wave progression, early  transition No significant change since last tracing Confirmed by Hanako Tipping   MD, Neyra Pettie ([id number]) on 01/07/2015 9:03:11 AM      MDM   Final diagnoses:  Chills    Patient symptoms have resolved her workup here without any significant abnormalities. EKG without any acute changes chest x-ray negative for pneumonia pneumothorax or pulmonary edema. Troponins negative patient never had any chest pain. Patient's electrolytes and labs without significant abnormality no evidence of any infection urinalysis is negative. Patient overall feels better will discharge home and follow-up with her primary care doctor and continue her medicines the GI medicine has her on.      Fredia Sorrow, MD 01/07/15 [phone]

## 2015-01-07 NOTE — ED Notes (Signed)
Awake. Verbally responsive. A/O x4. Resp even and unlabored. No audible adventitious breath sounds noted. ABC's intact.  

## 2015-01-07 NOTE — ED Notes (Addendum)
Pt arrived via EMS with report of sudden onset of uncontrollable tremors to all exts after using the bathroom, nausea, and anxiety. Pt reported prior to using bathroom she felt hot all over and then sweaty and weakness and noted scattered red rash to ext. But this happens at times. Pt stated, "Do you someone has poison me? I feel trembling inside". Pt ambulated to bathroom with steady gait.

## 2015-01-07 NOTE — ED Notes (Signed)
Bed: WA04 Expected date:  Expected time:  Means of arrival:  Comments: 48F/LE muscle tremors ?anxiety

## 2015-01-08 ENCOUNTER — Other Ambulatory Visit: Payer: Self-pay

## 2015-01-08 DIAGNOSIS — Z6822 Body mass index (BMI) 22.0-22.9, adult: Secondary | ICD-10-CM | POA: Diagnosis not present

## 2015-01-08 DIAGNOSIS — R109 Unspecified abdominal pain: Secondary | ICD-10-CM | POA: Diagnosis not present

## 2015-01-08 MED ORDER — RIFAXIMIN 550 MG PO TABS: 550.0000 mg | ORAL_TABLET | Freq: Three times a day (TID) | ORAL | Status: DC

## 2015-01-10 ENCOUNTER — Observation Stay (HOSPITAL_COMMUNITY)
Admission: EM | Admit: 2015-01-10 | Discharge: 2015-01-12 | Disposition: A | Discharge status: Home / Self Care | Payer: Commercial Managed Care - HMO | Attending: Surgery | Admitting: Surgery

## 2015-01-10 ENCOUNTER — Telehealth: Payer: Self-pay

## 2015-01-10 ENCOUNTER — Emergency Department (HOSPITAL_COMMUNITY): Payer: Commercial Managed Care - HMO

## 2015-01-10 ENCOUNTER — Encounter (HOSPITAL_COMMUNITY): Payer: Self-pay | Admitting: Emergency Medicine

## 2015-01-10 DIAGNOSIS — E039 Hypothyroidism, unspecified: Secondary | ICD-10-CM | POA: Diagnosis not present

## 2015-01-10 DIAGNOSIS — G629 Polyneuropathy, unspecified: Secondary | ICD-10-CM | POA: Insufficient documentation

## 2015-01-10 DIAGNOSIS — D649 Anemia, unspecified: Secondary | ICD-10-CM | POA: Insufficient documentation

## 2015-01-10 DIAGNOSIS — K219 Gastro-esophageal reflux disease without esophagitis: Secondary | ICD-10-CM | POA: Insufficient documentation

## 2015-01-10 DIAGNOSIS — R269 Unspecified abnormalities of gait and mobility: Secondary | ICD-10-CM | POA: Insufficient documentation

## 2015-01-10 DIAGNOSIS — Z880 Allergy status to penicillin: Secondary | ICD-10-CM | POA: Insufficient documentation

## 2015-01-10 DIAGNOSIS — I10 Essential (primary) hypertension: Secondary | ICD-10-CM | POA: Diagnosis not present

## 2015-01-10 DIAGNOSIS — R109 Unspecified abdominal pain: Secondary | ICD-10-CM

## 2015-01-10 DIAGNOSIS — K8020 Calculus of gallbladder without cholecystitis without obstruction: Secondary | ICD-10-CM | POA: Diagnosis not present

## 2015-01-10 DIAGNOSIS — Z419 Encounter for procedure for purposes other than remedying health state, unspecified: Secondary | ICD-10-CM

## 2015-01-10 DIAGNOSIS — Z79899 Other long term (current) drug therapy: Secondary | ICD-10-CM | POA: Insufficient documentation

## 2015-01-10 DIAGNOSIS — Z5181 Encounter for therapeutic drug level monitoring: Secondary | ICD-10-CM | POA: Insufficient documentation

## 2015-01-10 DIAGNOSIS — Z886 Allergy status to analgesic agent status: Secondary | ICD-10-CM | POA: Insufficient documentation

## 2015-01-10 DIAGNOSIS — R1013 Epigastric pain: Secondary | ICD-10-CM | POA: Diagnosis not present

## 2015-01-10 DIAGNOSIS — E785 Hyperlipidemia, unspecified: Secondary | ICD-10-CM | POA: Diagnosis not present

## 2015-01-10 DIAGNOSIS — R1011 Right upper quadrant pain: Secondary | ICD-10-CM | POA: Insufficient documentation

## 2015-01-10 DIAGNOSIS — I5189 Other ill-defined heart diseases: Secondary | ICD-10-CM | POA: Diagnosis not present

## 2015-01-10 DIAGNOSIS — K8010 Calculus of gallbladder with chronic cholecystitis without obstruction: Principal | ICD-10-CM | POA: Diagnosis present

## 2015-01-10 LAB — CBC WITH DIFFERENTIAL/PLATELET
BASOS PCT: 0 % (ref 0–1)
Basophils Absolute: 0 10*3/uL (ref 0.0–0.1)
Eosinophils Absolute: 0 10*3/uL (ref 0.0–0.7)
Eosinophils Relative: 0 % (ref 0–5)
HCT: 37 % (ref 36.0–46.0)
Hemoglobin: 12.2 g/dL (ref 12.0–15.0)
LYMPHS PCT: 28 % (ref 12–46)
Lymphs Abs: 1.1 10*3/uL (ref 0.7–4.0)
MCH: 29.1 pg (ref 26.0–34.0)
MCHC: 33 g/dL (ref 30.0–36.0)
MCV: 88.3 fL (ref 78.0–100.0)
MONOS PCT: 9 % (ref 3–12)
Monocytes Absolute: 0.4 10*3/uL (ref 0.1–1.0)
NEUTROS ABS: 2.6 10*3/uL (ref 1.7–7.7)
NEUTROS PCT: 63 % (ref 43–77)
Platelets: 306 10*3/uL (ref 150–400)
RBC: 4.19 MIL/uL (ref 3.87–5.11)
RDW: 12.6 % (ref 11.5–15.5)
WBC: 4.1 10*3/uL (ref 4.0–10.5)

## 2015-01-10 LAB — COMPREHENSIVE METABOLIC PANEL
ALT: 15 U/L (ref 14–54)
ANION GAP: 10 (ref 5–15)
AST: 22 U/L (ref 15–41)
Albumin: 4.1 g/dL (ref 3.5–5.0)
Alkaline Phosphatase: 48 U/L (ref 38–126)
BILIRUBIN TOTAL: 0.6 mg/dL (ref 0.3–1.2)
BUN: 9 mg/dL (ref 6–20)
CHLORIDE: 103 mmol/L (ref 101–111)
CO2: 27 mmol/L (ref 22–32)
CREATININE: 0.85 mg/dL (ref 0.44–1.00)
Calcium: 9.3 mg/dL (ref 8.9–10.3)
GFR calc Af Amer: >60 mL/min (ref >60)
GFR, EST NON AFRICAN AMERICAN: 60 mL/min — AB (ref >60)
Glucose, Bld: 95 mg/dL (ref 65–99)
Potassium: 4.1 mmol/L (ref 3.5–5.1)
Sodium: 140 mmol/L (ref 135–145)
Total Protein: 7.5 g/dL (ref 6.5–8.1)

## 2015-01-10 LAB — URINALYSIS, ROUTINE W REFLEX MICROSCOPIC
Bilirubin Urine: NEGATIVE
Glucose, UA: NEGATIVE mg/dL
Hgb urine dipstick: NEGATIVE
Ketones, ur: 15 mg/dL — AB
LEUKOCYTES UA: NEGATIVE
NITRITE: NEGATIVE
PH: 6.5 (ref 5.0–8.0)
Protein, ur: NEGATIVE mg/dL
Specific Gravity, Urine: 1.008 (ref 1.005–1.030)
UROBILINOGEN UA: 0.2 mg/dL (ref 0.0–1.0)

## 2015-01-10 LAB — TROPONIN I: Troponin I: <0.03 ng/mL (ref <0.031)

## 2015-01-10 LAB — PROTIME-INR
INR: 1.05 (ref 0.00–1.49)
Prothrombin Time: 13.9 seconds (ref 11.6–15.2)

## 2015-01-10 LAB — MAGNESIUM: Magnesium: 2 mg/dL (ref 1.7–2.4)

## 2015-01-10 LAB — SURGICAL PCR SCREEN
MRSA, PCR: NEGATIVE
STAPHYLOCOCCUS AUREUS: NEGATIVE

## 2015-01-10 LAB — CREATININE, SERUM
Creatinine, Ser: 0.81 mg/dL (ref 0.44–1.00)
GFR calc Af Amer: >60 mL/min (ref >60)
GFR calc non Af Amer: >60 mL/min (ref >60)

## 2015-01-10 LAB — LIPASE, BLOOD: Lipase: 19 U/L — ABNORMAL LOW (ref 22–51)

## 2015-01-10 LAB — LACTIC ACID, PLASMA
Lactic Acid, Venous: 0.9 mmol/L (ref 0.5–2.0)
Lactic Acid, Venous: 0.7 mmol/L (ref 0.5–2.0)

## 2015-01-10 LAB — APTT: aPTT: 32 seconds (ref 24–37)

## 2015-01-10 MED ORDER — HYDROMORPHONE HCL 1 MG/ML IJ SOLN
0.5000 mg | Freq: Once | INTRAMUSCULAR | Status: AC
  Administered 2015-01-10: 0.5 mg via INTRAVENOUS
  Filled 2015-01-10: qty 1

## 2015-01-10 MED ORDER — ONDANSETRON 4 MG PO TBDP: 4.0000 mg | ORAL_TABLET | Freq: Four times a day (QID) | ORAL | Status: DC | PRN

## 2015-01-10 MED ORDER — LEVOTHYROXINE SODIUM 125 MCG PO TABS
125.0000 ug | ORAL_TABLET | Freq: Every day | ORAL | Status: DC
  Administered 2015-01-10: 125 ug via ORAL
  Filled 2015-01-10 (×3): qty 1

## 2015-01-10 MED ORDER — LISINOPRIL 20 MG PO TABS
20.0000 mg | ORAL_TABLET | Freq: Every day | ORAL | Status: DC
  Administered 2015-01-10: 20 mg via ORAL
  Filled 2015-01-10 (×2): qty 1

## 2015-01-10 MED ORDER — MORPHINE SULFATE 2 MG/ML IJ SOLN
1.0000 mg | INTRAMUSCULAR | Status: DC | PRN
  Administered 2015-01-10: 2 mg via INTRAVENOUS
  Administered 2015-01-10: 1 mg via INTRAVENOUS
  Filled 2015-01-10 (×2): qty 1

## 2015-01-10 MED ORDER — HEPARIN SODIUM (PORCINE) 5000 UNIT/ML IJ SOLN
5000.0000 [IU] | Freq: Three times a day (TID) | INTRAMUSCULAR | Status: AC
  Administered 2015-01-10 (×2): 5000 [IU] via SUBCUTANEOUS
  Filled 2015-01-10 (×2): qty 1

## 2015-01-10 MED ORDER — ACETAMINOPHEN 650 MG RE SUPP: 650.0000 mg | Freq: Four times a day (QID) | RECTAL | Status: DC | PRN

## 2015-01-10 MED ORDER — HYDROMORPHONE HCL 1 MG/ML IJ SOLN
0.5000 mg | Freq: Once | INTRAMUSCULAR | Status: AC
  Administered 2015-01-10: 0.5 mg via INTRAVENOUS

## 2015-01-10 MED ORDER — ACETAMINOPHEN 325 MG PO TABS: 650.0000 mg | ORAL_TABLET | Freq: Four times a day (QID) | ORAL | Status: DC | PRN

## 2015-01-10 MED ORDER — OXYCODONE-ACETAMINOPHEN 5-325 MG PO TABS
1.0000 | ORAL_TABLET | ORAL | Status: DC | PRN
Start: 2015-01-10 — End: 2015-01-11

## 2015-01-10 MED ORDER — DIPHENHYDRAMINE HCL 12.5 MG/5ML PO ELIX: 12.5000 mg | ORAL_SOLUTION | Freq: Four times a day (QID) | ORAL | Status: DC | PRN

## 2015-01-10 MED ORDER — CIPROFLOXACIN IN D5W 400 MG/200ML IV SOLN
400.0000 mg | Freq: Two times a day (BID) | INTRAVENOUS | Status: DC
  Administered 2015-01-11: 400 mg via INTRAVENOUS
  Filled 2015-01-10 (×2): qty 200

## 2015-01-10 MED ORDER — DIPHENHYDRAMINE HCL 50 MG/ML IJ SOLN: 12.5000 mg | Freq: Four times a day (QID) | INTRAMUSCULAR | Status: DC | PRN

## 2015-01-10 MED ORDER — POTASSIUM CHLORIDE IN NACL 20-0.9 MEQ/L-% IV SOLN
INTRAVENOUS | Status: DC
  Administered 2015-01-10 – 2015-01-11 (×2): via INTRAVENOUS
  Filled 2015-01-10 (×3): qty 1000

## 2015-01-10 MED ORDER — SODIUM CHLORIDE 0.9 % IV SOLN
Freq: Once | INTRAVENOUS | Status: AC
  Administered 2015-01-10: 10:00:00 via INTRAVENOUS

## 2015-01-10 MED ORDER — PREGABALIN 50 MG PO CAPS
50.0000 mg | ORAL_CAPSULE | Freq: Every day | ORAL | Status: DC
  Administered 2015-01-10: 50 mg via ORAL
  Filled 2015-01-10: qty 1

## 2015-01-10 MED ORDER — ONDANSETRON HCL 4 MG/2ML IJ SOLN: 4.0000 mg | Freq: Four times a day (QID) | INTRAMUSCULAR | Status: DC | PRN

## 2015-01-10 MED ORDER — HYDROMORPHONE HCL 1 MG/ML IJ SOLN
INTRAMUSCULAR | Status: AC
  Filled 2015-01-10: qty 1

## 2015-01-10 MED ORDER — ONDANSETRON HCL 4 MG/2ML IJ SOLN
4.0000 mg | Freq: Once | INTRAMUSCULAR | Status: AC
  Administered 2015-01-10: 4 mg via INTRAVENOUS
  Filled 2015-01-10: qty 2

## 2015-01-10 NOTE — ED Provider Notes (Signed)
CSN: [id number]     Arrival date & time 01/10/15  4132 History   First MD Initiated Contact with Patient 01/10/15 [phone]     Chief Complaint  Patient presents with  . Abdominal Pain  . Nausea     (Consider location/radiation/quality/duration/timing/severity/associated sxs/prior Treatment) HPI 81 year old female who presents with epigastric and right upper quadrant abdominal pain. Reports a diagnosis of cholelithiasis over the course of the past 2 week with plans for outpatient cholecystectomy. She has had daily abdominal pain associated with food that last for about 1 hour. She woke up yesterday evening with persistent abdominal pain not resolved with home analgesics and antibiotics. She has had nausea but denies vomiting, fevers, chills. She had an episode of loose stools yesterday morning but denies any melena or hematochezia. Denies any dysuria or urinary frequency. She has not had any prior abdominal surgeries.    Past Medical History  Diagnosis Date  . Hypertension    History reviewed. No pertinent past surgical history. Family History  Problem Relation Age of Onset  . Heart disease Father    Social History  Substance Use Topics  . Smoking status: Never Smoker   . Smokeless tobacco: Never Used  . Alcohol Use: No   OB History    No data available     Review of Systems 10/14 systems reviewed and are negative other than those stated in the HPI    Allergies  Aspirin; Amoxicillin; and Penicillins  Home Medications   Prior to Admission medications   Medication Sig Start Date End Date Taking? Authorizing Provider  CRESTOR 5 MG tablet Take 5 mg by mouth at bedtime.  10/31/14  Yes Historical Provider, MD  hyoscyamine (LEVSIN SL) 0.125 MG SL tablet Take 1 tablet (0.125 mg total) by mouth every 6 (six) hours as needed. Patient taking differently: Take 0.125 mg by mouth every 6 (six) hours as needed for cramping.  12/26/14  Yes Amy S Esterwood, PA-C  lisinopril  (PRINIVIL,ZESTRIL) 20 MG tablet Take 20 mg by mouth daily. 10/11/14  Yes Historical Provider, MD  LORazepam (ATIVAN) 0.5 MG tablet Take 0.5 mg by mouth every 4 (four) hours as needed for anxiety.  12/19/14  Yes Historical Provider, MD  LYRICA 50 MG capsule Take 50 mg by mouth at bedtime.  10/03/14  Yes Historical Provider, MD  ondansetron (ZOFRAN) 4 MG tablet Take 1 tablet (4 mg total) by mouth every 6 (six) hours. Patient taking differently: Take 4 mg by mouth every 6 (six) hours as needed for nausea or vomiting.  12/29/14  Yes Geronimo Boot, MD  saccharomyces boulardii (FLORASTOR) 250 MG capsule Take 1 capsule (250 mg total) by mouth 2 (two) times daily. 12/26/14  Yes Amy S Esterwood, PA-C  SYNTHROID 125 MCG tablet Take 125 mcg by mouth daily. 09/22/14  Yes Historical Provider, MD  ranitidine (ZANTAC) 150 MG tablet Take 1 tablet (150 mg total) by mouth at bedtime. Patient not taking: Reported on 01/10/2015 01/02/15   Cecille Rubin P Hvozdovic, PA-C  rifaximin (XIFAXAN) 550 MG TABS tablet Take 1 tablet (550 mg total) by mouth 3 (three) times daily. For 14 days Patient not taking: Reported on 01/10/2015 01/08/15   Lori P Hvozdovic, PA-C   BP 184/81 mmHg  Pulse 71  Temp(Src) 98.7 F (37.1 C) (Oral)  Resp 20  Ht 5\' 5"  (1.651 m)  Wt 129 lb 13.6 oz (58.9 kg)  BMI 21.61 kg/m2  SpO2 98% Physical Exam Physical Exam  Nursing note and vitals reviewed. Constitutional: Well  developed, well nourished, non-toxic, and in no acute distress Head: Normocephalic and atraumatic.  Mouth/Throat: Oropharynx is clear and moist.  Neck: Normal range of motion. Neck supple.  Cardiovascular: Normal rate and regular rhythm.   Pulmonary/Chest: Effort normal and breath sounds normal.  Abdominal: Soft. There is positive Murphy's sign and tenderness in the epigastrium. There is no rebound and no guarding. No CVA tenderness. Musculoskeletal: Normal range of motion.  Neurological: Alert, no facial droop, fluent speech, moves all  extremities symmetrically Skin: Skin is warm and dry.  Psychiatric: Cooperative  ED Course  Procedures (including critical care time) Labs Review Labs Reviewed  COMPREHENSIVE METABOLIC PANEL - Abnormal; Notable for the following:    GFR calc non Af Amer 60 (*)    All other components within normal limits  LIPASE, BLOOD - Abnormal; Notable for the following:    Lipase 19 (*)    All other components within normal limits  URINALYSIS, ROUTINE W REFLEX MICROSCOPIC (NOT AT [facility]) - Abnormal; Notable for the following:    Ketones, ur 15 (*)    All other components within normal limits  SURGICAL PCR SCREEN  CBC WITH DIFFERENTIAL/PLATELET  TROPONIN I  LACTIC ACID, PLASMA  LACTIC ACID, PLASMA  CREATININE, SERUM  APTT  PROTIME-INR  MAGNESIUM  COMPREHENSIVE METABOLIC PANEL  CBC  LIPASE, BLOOD    Imaging Review US Abdomen Limited Ruq  01/10/2015   CLINICAL DATA:  Right upper quadrant abdominal pain for 2 months.  EXAM: US ABDOMEN LIMITED - RIGHT UPPER QUADRANT  COMPARISON:  CT scan of December 28, 2014.  FINDINGS: Gallbladder:  Multiple gallstones are noted with the largest measuring 7 mm. No gallbladder wall thickening or pericholecystic fluid is noted. Sonographic Murphy's sign could not be assessed as the patient has received pain medication.  Common bile duct:  Diameter: 6.9 mm which is mildly dilated. Correlation with liver function tests is recommended.  Liver:  No focal lesion identified. Within normal limits in parenchymal echogenicity. No intrahepatic biliary dilatation is noted.  IMPRESSION: Cholelithiasis is noted without evidence of cholecystitis. Minimal dilatation of distal common bile duct is noted without intrahepatic biliary dilatation ; correlation with liver function tests is recommended to rule out obstruction.   Electronically Signed   By: Marijo Conception, M.D.   On: 01/10/2015 11:42     EKG Interpretation   Date/Time:  Wednesday January 10 2015 09:50:03 EDT Ventricular  Rate:  82 PR Interval:  162 QRS Duration: 81 QT Interval:  388 QTC Calculation: 453 R Axis:   74 Text Interpretation:  Normal Sinus rhythm No significant change since last  tracing Confirmed by Dawna Jakes MD, Demontay Grantham ([id number]) on 01/10/2015 10:49:35 AM      MDM   Final diagnoses:  Abdominal pain    81 year old female who presents with RUQ abdominal pain. She appears uncomfortable, but is non-toxic and not ill appearing on arrival. She is AF and vital signs are stable. She has Murphy's sign on abdominal exam, but no peritoneal signs noted. Concern for cholecystitis.   No leukocytosis noted, and normal LFTs and bilirubin. Thus, not suggestive of obstructive biliary pathology. RUQ [place] visualized and reveals cholelithiasis without secondary signs of cholecystitis. ACS evaluation given, for concern of atypical and referred chest pain. Troponin x 1 negative. EKG shows no evidence of ischemia or infarction.    She received IVF, zofran, and dilaudid 0.5 mg x 67for pain control.   She continues to have abdominal pain suggestive of persistent biliary colic. Surgery  consulted regarding elective cholecystectomy. They have admitted for ongoing management.       Forde Dandy, MD 01/10/15 [phone]

## 2015-01-10 NOTE — ED Notes (Signed)
DELAY IN LABS DUE TO MD BEING IN ROOM

## 2015-01-10 NOTE — H&P (Signed)
Sarah Regal, Sarah Manning Physician Assistant Cosign Needed Surgery Consult Note 01/10/2015 12:18 PM    Expand All Collapse All   Reason for Consult:  cholelithiasis Referring Physician: Dr. Tyrone Schimke GI:  [facility] PCP: Jerlyn Ly, MD  Sarah Manning is an 81 y.o. female.   HPI: Pt presented to GI on 01/02/15 with diarrhea and abdominal pain.  He had 6 weeks of symptoms prior to that evaluation. Stool studies were negative.  CT scan showed gallstones and 5 mm cyst in body of the pancrease.  Some diverticulosis and otherwise negative findings.  She was treated with Flagyl, Levsin and antireflux medications.  She was planning to go to [place].  Since then she has called and been to ED on 8/5, 8/7, and now again today.  Last pm  with abdominal pain, nausea at night, and reports her skin is changing to a yellow color She is afebrile, VSS.  Labs show a normal WBC and LFT's are normal lipase is 19.  Troponin is <0.03.  Ultrasound this AM shows:  Multiple gallstones are noted with the largest measuring 7 mm. No gallbladder wall thickening or pericholecystic fluid is noted. Sonographic Murphy's sign could not be assessed as the patient has received pain medication. Common bile duct: Diameter: 6.9 mm which is mildly dilated. Correlation with liver function tests is recommended.    Past Medical History   Diagnosis  Date   Hypertension     Hypothyroid     Dyslipidemia     Peripheral neuropathy with some gait instablility     GERD     Hx of mild MR & AR/grade 1 diastolic dysfunction 12/4164       History reviewed. No pertinent past surgical history.    Family History   Problem  Relation  Age of Onset   .  Heart disease  Father       Social History:  reports that she has never smoked. She has never used smokeless tobacco. She reports that she does not drink alcohol or use illicit drugs.  Allergies:   Allergies   Allergen  Reactions   .  Aspirin  Other (See Comments)       Excessive blood  thinning   .  Amoxicillin  Itching and Rash   .  Penicillins  Itching and Rash       Prior to Admission medications    Medication  Sig  Start Date  End Date  Taking?  Authorizing Provider   CRESTOR 5 MG tablet  Take 5 mg by mouth at bedtime.   10/31/14    Yes  Historical Provider, MD   hyoscyamine (LEVSIN SL) 0.125 MG SL tablet  Take 1 tablet (0.125 mg total) by mouth every 6 (six) hours as needed.  Patient taking differently: Take 0.125 mg by mouth every 6 (six) hours as needed for cramping.   12/26/14    Yes  Amy S Esterwood, Sarah Manning   lisinopril (PRINIVIL,ZESTRIL) 20 MG tablet  Take 20 mg by mouth daily.  10/11/14    Yes  Historical Provider, MD   LORazepam (ATIVAN) 0.5 MG tablet  Take 0.5 mg by mouth every 4 (four) hours as needed for anxiety.   12/19/14    Yes  Historical Provider, MD   LYRICA 50 MG capsule  Take 50 mg by mouth at bedtime.   10/03/14    Yes  Historical Provider, MD   ondansetron (ZOFRAN) 4 MG tablet  Take 1 tablet (4 mg total) by  mouth every 6 (six) hours.  Patient taking differently: Take 4 mg by mouth every 6 (six) hours as needed for nausea or vomiting.   12/29/14    Yes  Geronimo Boot, MD   saccharomyces boulardii (FLORASTOR) 250 MG capsule  Take 1 capsule (250 mg total) by mouth 2 (two) times daily.  12/26/14    Yes  Amy S Esterwood, Sarah Manning   SYNTHROID 125 MCG tablet  Take 125 mcg by mouth daily.  09/22/14    Yes  Historical Provider, MD   ranitidine (ZANTAC) 150 MG tablet  Take 1 tablet (150 mg total) by mouth at bedtime.  Patient not taking: Reported on 01/10/2015  01/02/15      Cecille Rubin P Hvozdovic, Sarah Manning   rifaximin (XIFAXAN) 550 MG TABS tablet  Take 1 tablet (550 mg total) by mouth 3 (three) times daily. For 14 days  Patient not taking: Reported on 01/10/2015  01/08/15      Vita Barley Hvozdovic, Sarah Manning         Lab Results Last 48 Hours    Results for orders placed or performed during the hospital encounter of 01/10/15 (from the past 48 hour(s))   CBC with Differential     Status: None      Collection Time: 01/10/15  9:39 AM   Result  Value  Ref Range     WBC  4.1  4.0 - 10.5 K/uL     RBC  4.19  3.87 - 5.11 MIL/uL     Hemoglobin  12.2  12.0 - 15.0 g/dL     HCT  37.0  36.0 - 46.0 %     MCV  88.3  78.0 - 100.0 fL     MCH  29.1  26.0 - 34.0 pg     MCHC  33.0  30.0 - 36.0 g/dL     RDW  12.6  11.5 - 15.5 %     Platelets  306  150 - 400 K/uL     Neutrophils Relative %  63  43 - 77 %     Neutro Abs  2.6  1.7 - 7.7 K/uL     Lymphocytes Relative  28  12 - 46 %     Lymphs Abs  1.1  0.7 - 4.0 K/uL     Monocytes Relative  9  3 - 12 %     Monocytes Absolute  0.4  0.1 - 1.0 K/uL     Eosinophils Relative  0  0 - 5 %     Eosinophils Absolute  0.0  0.0 - 0.7 K/uL     Basophils Relative  0  0 - 1 %     Basophils Absolute  0.0  0.0 - 0.1 K/uL   Comprehensive metabolic panel     Status: Abnormal     Collection Time: 01/10/15  9:39 AM   Result  Value  Ref Range     Sodium  140  135 - 145 mmol/L     Potassium  4.1  3.5 - 5.1 mmol/L     Chloride  103  101 - 111 mmol/L     CO2  27  22 - 32 mmol/L     Glucose, Bld  95  65 - 99 mg/dL     BUN  9  6 - 20 mg/dL     Creatinine, Ser  0.85  0.44 - 1.00 mg/dL     Calcium  9.3  8.9 - 10.3 mg/dL  Total Protein  7.5  6.5 - 8.1 g/dL     Albumin  4.1  3.5 - 5.0 g/dL     AST  22  15 - 41 U/L     ALT  15  14 - 54 U/L     Alkaline Phosphatase  48  38 - 126 U/L     Total Bilirubin  0.6  0.3 - 1.2 mg/dL     GFR calc non Af Amer  60 (L)  >60 mL/min     GFR calc Af Amer  >60  >60 mL/min       Comment:  (NOTE)  The eGFR has been calculated using the CKD EPI equation. This calculation has not been validated in all clinical situations. eGFR's persistently <60 mL/min signify possible Chronic Kidney Disease.      Anion gap  10  5 - 15   Lipase, blood     Status: Abnormal     Collection Time: 01/10/15  9:39 AM   Result  Value  Ref Range     Lipase  19 (L)  22 - 51 U/L   Troponin I     Status: None     Collection Time: 01/10/15  9:39 AM    Result  Value  Ref Range     Troponin I  <0.03  <0.031 ng/mL       Comment:           NO INDICATION OF MYOCARDIAL INJURY.    Lactic acid, plasma     Status: None     Collection Time: 01/10/15  9:40 AM   Result  Value  Ref Range     Lactic Acid, Venous  0.9  0.5 - 2.0 mmol/L        Imaging Results (Last 48 hours)    US Abdomen Limited Ruq  01/10/2015   CLINICAL DATA:  Right upper quadrant abdominal pain for 2 months.  EXAM: US ABDOMEN LIMITED - RIGHT UPPER QUADRANT  COMPARISON:  CT scan of December 28, 2014.  FINDINGS: Gallbladder:  Multiple gallstones are noted with the largest measuring 7 mm. No gallbladder wall thickening or pericholecystic fluid is noted. Sonographic Murphy's sign could not be assessed as the patient has received pain medication.  Common bile duct:  Diameter: 6.9 mm which is mildly dilated. Correlation with liver function tests is recommended.  Liver:  No focal lesion identified. Within normal limits in parenchymal echogenicity. No intrahepatic biliary dilatation is noted.  IMPRESSION: Cholelithiasis is noted without evidence of cholecystitis. Minimal dilatation of distal common bile duct is noted without intrahepatic biliary dilatation ; correlation with liver function tests is recommended to rule out obstruction.   Electronically Signed   By: Marijo Conception, M.D.   On: 01/10/2015 11:42     Review of Systems  Constitutional: Positive for weight loss (6 pounds over 2 months). Negative for fever, chills, malaise/fatigue and diaphoresis.  HENT: Negative.   Eyes: Negative.   Respiratory: Negative.   Cardiovascular: Positive for leg swelling.  Gastrointestinal: Positive for heartburn, nausea and abdominal pain. Negative for vomiting, diarrhea, constipation and blood in stool.        Says stool are yellow in color, "gold."  Genitourinary: Negative.   Musculoskeletal:        Lower leg neuropathy, some trouble with her gait, less steady than in the past.  Skin: Positive for  rash (says she was splotchy earlier today with pain). Negative for itching.  Neurological: Positive for weakness.  Negative for tingling, tremors, sensory change, speech change, focal weakness, seizures and loss of consciousness.  Endo/Heme/Allergies: Negative for environmental allergies and polydipsia. Does not bruise/bleed easily.  Psychiatric/Behavioral: The patient is nervous/anxious.    Blood pressure 150/63, pulse 61, temperature 98.7 F (37.1 C), temperature source Oral, resp. rate 15, SpO2 99 %. Physical Exam  Constitutional: She is oriented to person, place, and time. No distress.  Elderly female, very anxious, but no distress. Currently not having pain.  Anxious about pain, chronic and acute.  HENT:  Head: Normocephalic and atraumatic.   Nose: Nose normal.  Eyes: Conjunctivae and EOM are normal. Right eye exhibits no discharge. Left eye exhibits no discharge. No scleral icterus.  Neck: Normal range of motion. Neck supple. No JVD present. No tracheal deviation present. No thyromegaly present.  Cardiovascular: Normal rate, regular rhythm, normal heart sounds and intact distal pulses.    No murmur heard. Respiratory: Effort normal and breath sounds normal. No respiratory distress. She has no wheezes. She has no rales. She exhibits no tenderness.  GI: Soft. Bowel sounds are normal. She exhibits no distension and no mass. There is no tenderness. There is no rebound and no guarding.  Musculoskeletal: She exhibits no edema or tenderness.  Lymphadenopathy:    She has no cervical adenopathy.  Neurological: She is alert and oriented to person, place, and time. No cranial nerve deficit.  Skin: Skin is warm and dry. No rash noted. She is not diaphoretic. No erythema. No pallor.  Psychiatric: She has a normal mood and affect. Her behavior is normal. Judgment and thought content normal.    Assessment/Plan: Symptomatic cholelithiasis Hypertension Hypothyroid Hx of mild AR/MR/grade 1  diastolic dysfunction Peripheral neuropathy with some gait instability  Plan:  Review with Dr. Harlow Asa.  I think she needs her Gallbladder out.Will plan to admit, hydrate over night, start antibiotics and plan surgery tomorrow.  Tania Perrott 01/10/2015, 12:18 PM

## 2015-01-10 NOTE — ED Notes (Signed)
Pt can go at 15:30...kjl

## 2015-01-10 NOTE — ED Notes (Signed)
Got patients family member coffee

## 2015-01-10 NOTE — Telephone Encounter (Signed)
Josh from [facility] states he did get Xifaxan approved through patient's insurance but her co-pay is 400 dollars. Informed Josh that I will send this to Cecille Rubin to find out what she wants to send in it's place. Please advise Cecille Rubin.

## 2015-01-10 NOTE — Consult Note (Signed)
Reason for Consult:  cholelithiasis Referring Physician: Dr. Tyrone Schimke GI:  [facility] PCP: Jerlyn Ly, MD  Sarah Manning is an 81 y.o. female.   HPI: Pt presented to GI on 01/02/15 with diarrhea and abdominal pain.  He had 6 weeks of symptoms prior to that evaluation. Stool studies were negative.  CT scan showed gallstones and 5 mm cyst in body of the pancrease.  Some diverticulosis and otherwise negative findings.  She was treated with Flagyl, Levsin and antireflux medications.  She was planning to go to [place].  Since then she has called and been to ED on 8/5, 8/7, and now again today.  Last pm  with abdominal pain, nausea at night, and reports her skin is changing to a yellow color She is afebrile, VSS.  Labs show a normal WBC and LFT's are normal lipase is 19.  Troponin is <0.03.  Ultrasound this AM shows:  Multiple gallstones are noted with the largest measuring 7 mm. No gallbladder wall thickening or pericholecystic fluid is noted. Sonographic Murphy's sign could not be assessed as the patient has received pain medication. Common bile duct: Diameter: 6.9 mm which is mildly dilated. Correlation with liver function tests is recommended.  Past Medical History  Diagnosis Date  Hypertension   Hypothyroid   Dyslipidemia   Peripheral neuropathy with some gait instablility   GERD   Hx of mild MR & AR/grade 1 diastolic dysfunction 09/7320     History reviewed. No pertinent past surgical history.  Family History  Problem Relation Age of Onset  . Heart disease Father     Social History:  reports that she has never smoked. She has never used smokeless tobacco. She reports that she does not drink alcohol or use illicit drugs.  Allergies:  Allergies  Allergen Reactions  . Aspirin Other (See Comments)    Excessive blood thinning  . Amoxicillin Itching and Rash  . Penicillins Itching and Rash    Prior to Admission medications   Medication Sig Start Date End Date Taking? Authorizing  Provider  CRESTOR 5 MG tablet Take 5 mg by mouth at bedtime.  10/31/14  Yes Historical Provider, MD  hyoscyamine (LEVSIN SL) 0.125 MG SL tablet Take 1 tablet (0.125 mg total) by mouth every 6 (six) hours as needed. Patient taking differently: Take 0.125 mg by mouth every 6 (six) hours as needed for cramping.  12/26/14  Yes Amy S Esterwood, PA-C  lisinopril (PRINIVIL,ZESTRIL) 20 MG tablet Take 20 mg by mouth daily. 10/11/14  Yes Historical Provider, MD  LORazepam (ATIVAN) 0.5 MG tablet Take 0.5 mg by mouth every 4 (four) hours as needed for anxiety.  12/19/14  Yes Historical Provider, MD  LYRICA 50 MG capsule Take 50 mg by mouth at bedtime.  10/03/14  Yes Historical Provider, MD  ondansetron (ZOFRAN) 4 MG tablet Take 1 tablet (4 mg total) by mouth every 6 (six) hours. Patient taking differently: Take 4 mg by mouth every 6 (six) hours as needed for nausea or vomiting.  12/29/14  Yes Geronimo Boot, MD  saccharomyces boulardii (FLORASTOR) 250 MG capsule Take 1 capsule (250 mg total) by mouth 2 (two) times daily. 12/26/14  Yes Amy S Esterwood, PA-C  SYNTHROID 125 MCG tablet Take 125 mcg by mouth daily. 09/22/14  Yes Historical Provider, MD  ranitidine (ZANTAC) 150 MG tablet Take 1 tablet (150 mg total) by mouth at bedtime. Patient not taking: Reported on 01/10/2015 01/02/15   Cecille Rubin P Hvozdovic, PA-C  rifaximin (XIFAXAN) 550  MG TABS tablet Take 1 tablet (550 mg total) by mouth 3 (three) times daily. For 14 days Patient not taking: Reported on 01/10/2015 01/08/15   Vita Barley Hvozdovic, PA-C     Results for orders placed or performed during the hospital encounter of 01/10/15 (from the past 48 hour(s))  CBC with Differential     Status: None   Collection Time: 01/10/15  9:39 AM  Result Value Ref Range   WBC 4.1 4.0 - 10.5 K/uL   RBC 4.19 3.87 - 5.11 MIL/uL   Hemoglobin 12.2 12.0 - 15.0 g/dL   HCT 37.0 36.0 - 46.0 %   MCV 88.3 78.0 - 100.0 fL   MCH 29.1 26.0 - 34.0 pg   MCHC 33.0 30.0 - 36.0 g/dL   RDW 12.6 11.5 -  15.5 %   Platelets 306 150 - 400 K/uL   Neutrophils Relative % 63 43 - 77 %   Neutro Abs 2.6 1.7 - 7.7 K/uL   Lymphocytes Relative 28 12 - 46 %   Lymphs Abs 1.1 0.7 - 4.0 K/uL   Monocytes Relative 9 3 - 12 %   Monocytes Absolute 0.4 0.1 - 1.0 K/uL   Eosinophils Relative 0 0 - 5 %   Eosinophils Absolute 0.0 0.0 - 0.7 K/uL   Basophils Relative 0 0 - 1 %   Basophils Absolute 0.0 0.0 - 0.1 K/uL  Comprehensive metabolic panel     Status: Abnormal   Collection Time: 01/10/15  9:39 AM  Result Value Ref Range   Sodium 140 135 - 145 mmol/L   Potassium 4.1 3.5 - 5.1 mmol/L   Chloride 103 101 - 111 mmol/L   CO2 27 22 - 32 mmol/L   Glucose, Bld 95 65 - 99 mg/dL   BUN 9 6 - 20 mg/dL   Creatinine, Ser 0.85 0.44 - 1.00 mg/dL   Calcium 9.3 8.9 - 10.3 mg/dL   Total Protein 7.5 6.5 - 8.1 g/dL   Albumin 4.1 3.5 - 5.0 g/dL   AST 22 15 - 41 U/L   ALT 15 14 - 54 U/L   Alkaline Phosphatase 48 38 - 126 U/L   Total Bilirubin 0.6 0.3 - 1.2 mg/dL   GFR calc non Af Amer 60 (L) >60 mL/min   GFR calc Af Amer >60 >60 mL/min    Comment: (NOTE) The eGFR has been calculated using the CKD EPI equation. This calculation has not been validated in all clinical situations. eGFR's persistently <60 mL/min signify possible Chronic Kidney Disease.    Anion gap 10 5 - 15  Lipase, blood     Status: Abnormal   Collection Time: 01/10/15  9:39 AM  Result Value Ref Range   Lipase 19 (L) 22 - 51 U/L  Troponin I     Status: None   Collection Time: 01/10/15  9:39 AM  Result Value Ref Range   Troponin I <0.03 <0.031 ng/mL    Comment:        NO INDICATION OF MYOCARDIAL INJURY.   Lactic acid, plasma     Status: None   Collection Time: 01/10/15  9:40 AM  Result Value Ref Range   Lactic Acid, Venous 0.9 0.5 - 2.0 mmol/L    US Abdomen Limited Ruq  01/10/2015   CLINICAL DATA:  Right upper quadrant abdominal pain for 2 months.  EXAM: US ABDOMEN LIMITED - RIGHT UPPER QUADRANT  COMPARISON:  CT scan of December 28, 2014.   FINDINGS: Gallbladder:  Multiple gallstones are  noted with the largest measuring 7 mm. No gallbladder wall thickening or pericholecystic fluid is noted. Sonographic Murphy's sign could not be assessed as the patient has received pain medication.  Common bile duct:  Diameter: 6.9 mm which is mildly dilated. Correlation with liver function tests is recommended.  Liver:  No focal lesion identified. Within normal limits in parenchymal echogenicity. No intrahepatic biliary dilatation is noted.  IMPRESSION: Cholelithiasis is noted without evidence of cholecystitis. Minimal dilatation of distal common bile duct is noted without intrahepatic biliary dilatation ; correlation with liver function tests is recommended to rule out obstruction.   Electronically Signed   By: Marijo Conception, M.D.   On: 01/10/2015 11:42    Review of Systems  Constitutional: Positive for weight loss (6 pounds over 2 months). Negative for fever, chills, malaise/fatigue and diaphoresis.  HENT: Negative.   Eyes: Negative.   Respiratory: Negative.   Cardiovascular: Positive for leg swelling.  Gastrointestinal: Positive for heartburn, nausea and abdominal pain. Negative for vomiting, diarrhea, constipation and blood in stool.       Says stool are yellow in color, "gold."  Genitourinary: Negative.   Musculoskeletal:       Lower leg neuropathy, some trouble with her gait, less steady than in the past.  Skin: Positive for rash (says she was splotchy earlier today with pain). Negative for itching.  Neurological: Positive for weakness. Negative for tingling, tremors, sensory change, speech change, focal weakness, seizures and loss of consciousness.  Endo/Heme/Allergies: Negative for environmental allergies and polydipsia. Does not bruise/bleed easily.  Psychiatric/Behavioral: The patient is nervous/anxious.    Blood pressure 150/63, pulse 61, temperature 98.7 F (37.1 C), temperature source Oral, resp. rate 15, SpO2 99 %. Physical Exam   Constitutional: She is oriented to person, place, and time. No distress.  Elderly female, very anxious, but no distress. Currently not having pain.  Anxious about pain, chronic and acute.  HENT:  Head: Normocephalic and atraumatic.  Nose: Nose normal.  Eyes: Conjunctivae and EOM are normal. Right eye exhibits no discharge. Left eye exhibits no discharge. No scleral icterus.  Neck: Normal range of motion. Neck supple. No JVD present. No tracheal deviation present. No thyromegaly present.  Cardiovascular: Normal rate, regular rhythm, normal heart sounds and intact distal pulses.   No murmur heard. Respiratory: Effort normal and breath sounds normal. No respiratory distress. She has no wheezes. She has no rales. She exhibits no tenderness.  GI: Soft. Bowel sounds are normal. She exhibits no distension and no mass. There is no tenderness. There is no rebound and no guarding.  Musculoskeletal: She exhibits no edema or tenderness.  Lymphadenopathy:    She has no cervical adenopathy.  Neurological: She is alert and oriented to person, place, and time. No cranial nerve deficit.  Skin: Skin is warm and dry. No rash noted. She is not diaphoretic. No erythema. No pallor.  Psychiatric: She has a normal mood and affect. Her behavior is normal. Judgment and thought content normal.    Assessment/Plan: Symptomatic cholelithiasis Hypertension Hypothyroid Hx of mild AR/MR/grade 1 diastolic dysfunction Peripheral neuropathy with some gait instability  Plan:  Review with Dr. Harlow Asa.  I think she needs her Gallbladder out, but needs medical clearance before we recommend surgery.   Kymberley Raz 01/10/2015, 12:18 PM

## 2015-01-10 NOTE — ED Notes (Signed)
Pt states that she has been having gallbladder pain over the past couple weeks.  Pt is supposed to see [facility] tomorrow but had severe pain and nausea last night. Pt denies v/d.  Pt states that she was told that her skin is turning yellow.

## 2015-01-11 ENCOUNTER — Encounter (HOSPITAL_COMMUNITY)
Admission: EM | Disposition: A | Discharge status: Home / Self Care | Payer: Self-pay | Source: Home / Self Care | Attending: Emergency Medicine

## 2015-01-11 ENCOUNTER — Encounter (HOSPITAL_COMMUNITY): Payer: Self-pay | Admitting: Surgery

## 2015-01-11 ENCOUNTER — Inpatient Hospital Stay (HOSPITAL_COMMUNITY): Payer: Commercial Managed Care - HMO | Admitting: Anesthesiology

## 2015-01-11 ENCOUNTER — Inpatient Hospital Stay (HOSPITAL_COMMUNITY): Payer: Commercial Managed Care - HMO

## 2015-01-11 DIAGNOSIS — D649 Anemia, unspecified: Secondary | ICD-10-CM | POA: Diagnosis not present

## 2015-01-11 DIAGNOSIS — R1011 Right upper quadrant pain: Secondary | ICD-10-CM | POA: Diagnosis not present

## 2015-01-11 DIAGNOSIS — E785 Hyperlipidemia, unspecified: Secondary | ICD-10-CM | POA: Diagnosis not present

## 2015-01-11 DIAGNOSIS — K8010 Calculus of gallbladder with chronic cholecystitis without obstruction: Secondary | ICD-10-CM | POA: Diagnosis not present

## 2015-01-11 DIAGNOSIS — E039 Hypothyroidism, unspecified: Secondary | ICD-10-CM | POA: Diagnosis not present

## 2015-01-11 DIAGNOSIS — G629 Polyneuropathy, unspecified: Secondary | ICD-10-CM | POA: Diagnosis not present

## 2015-01-11 DIAGNOSIS — K8020 Calculus of gallbladder without cholecystitis without obstruction: Secondary | ICD-10-CM | POA: Diagnosis not present

## 2015-01-11 DIAGNOSIS — I5189 Other ill-defined heart diseases: Secondary | ICD-10-CM | POA: Diagnosis not present

## 2015-01-11 DIAGNOSIS — I10 Essential (primary) hypertension: Secondary | ICD-10-CM | POA: Diagnosis not present

## 2015-01-11 DIAGNOSIS — K219 Gastro-esophageal reflux disease without esophagitis: Secondary | ICD-10-CM | POA: Diagnosis not present

## 2015-01-11 HISTORY — PX: CHOLECYSTECTOMY: SHX55

## 2015-01-11 LAB — COMPREHENSIVE METABOLIC PANEL
ALBUMIN: 3.1 g/dL — AB (ref 3.5–5.0)
ALT: 13 U/L — ABNORMAL LOW (ref 14–54)
ANION GAP: 4 — AB (ref 5–15)
AST: 19 U/L (ref 15–41)
Alkaline Phosphatase: 40 U/L (ref 38–126)
BUN: 8 mg/dL (ref 6–20)
CALCIUM: 8.5 mg/dL — AB (ref 8.9–10.3)
CO2: 29 mmol/L (ref 22–32)
CREATININE: 0.66 mg/dL (ref 0.44–1.00)
Chloride: 107 mmol/L (ref 101–111)
GFR calc Af Amer: >60 mL/min (ref >60)
GFR calc non Af Amer: >60 mL/min (ref >60)
Glucose, Bld: 93 mg/dL (ref 65–99)
Potassium: 4.1 mmol/L (ref 3.5–5.1)
SODIUM: 140 mmol/L (ref 135–145)
TOTAL PROTEIN: 6.2 g/dL — AB (ref 6.5–8.1)
Total Bilirubin: 0.4 mg/dL (ref 0.3–1.2)

## 2015-01-11 LAB — CBC
HEMATOCRIT: 32.2 % — AB (ref 36.0–46.0)
Hemoglobin: 10.3 g/dL — ABNORMAL LOW (ref 12.0–15.0)
MCH: 28.5 pg (ref 26.0–34.0)
MCHC: 32 g/dL (ref 30.0–36.0)
MCV: 89.2 fL (ref 78.0–100.0)
PLATELETS: 234 10*3/uL (ref 150–400)
RBC: 3.61 MIL/uL — ABNORMAL LOW (ref 3.87–5.11)
RDW: 12.7 % (ref 11.5–15.5)
WBC: 4.5 10*3/uL (ref 4.0–10.5)

## 2015-01-11 LAB — LIPASE, BLOOD: Lipase: 15 U/L — ABNORMAL LOW (ref 22–51)

## 2015-01-11 SURGERY — LAPAROSCOPIC CHOLECYSTECTOMY WITH INTRAOPERATIVE CHOLANGIOGRAM
Anesthesia: General

## 2015-01-11 MED ORDER — ACETAMINOPHEN 650 MG RE SUPP: 650.0000 mg | Freq: Four times a day (QID) | RECTAL | Status: DC | PRN

## 2015-01-11 MED ORDER — ROCURONIUM BROMIDE 100 MG/10ML IV SOLN
INTRAVENOUS | Status: DC | PRN
  Administered 2015-01-11: 5 mg via INTRAVENOUS
  Administered 2015-01-11: 20 mg via INTRAVENOUS

## 2015-01-11 MED ORDER — NEOSTIGMINE METHYLSULFATE 10 MG/10ML IV SOLN
INTRAVENOUS | Status: DC | PRN
  Administered 2015-01-11: 3 mg via INTRAVENOUS

## 2015-01-11 MED ORDER — LORAZEPAM 0.5 MG PO TABS
0.5000 mg | ORAL_TABLET | ORAL | Status: DC | PRN
  Administered 2015-01-11: 0.5 mg via ORAL
  Filled 2015-01-11: qty 1

## 2015-01-11 MED ORDER — KCL IN DEXTROSE-NACL 20-5-0.45 MEQ/L-%-% IV SOLN
INTRAVENOUS | Status: DC
  Administered 2015-01-11: 11:00:00 via INTRAVENOUS
  Filled 2015-01-11 (×2): qty 1000

## 2015-01-11 MED ORDER — FENTANYL CITRATE (PF) 100 MCG/2ML IJ SOLN: 25.0000 ug | INTRAMUSCULAR | Status: DC | PRN

## 2015-01-11 MED ORDER — ONDANSETRON HCL 4 MG/2ML IJ SOLN
INTRAMUSCULAR | Status: AC
  Filled 2015-01-11: qty 2

## 2015-01-11 MED ORDER — SUCCINYLCHOLINE CHLORIDE 20 MG/ML IJ SOLN
INTRAMUSCULAR | Status: DC | PRN
  Administered 2015-01-11: 100 mg via INTRAVENOUS

## 2015-01-11 MED ORDER — FENTANYL CITRATE (PF) 100 MCG/2ML IJ SOLN
INTRAMUSCULAR | Status: AC
  Filled 2015-01-11: qty 4

## 2015-01-11 MED ORDER — ONDANSETRON HCL 4 MG/2ML IJ SOLN
INTRAMUSCULAR | Status: DC | PRN
  Administered 2015-01-11: 4 mg via INTRAVENOUS

## 2015-01-11 MED ORDER — HYDROMORPHONE HCL 1 MG/ML IJ SOLN
1.0000 mg | INTRAMUSCULAR | Status: DC | PRN
  Administered 2015-01-11: 1 mg via INTRAVENOUS
  Filled 2015-01-11: qty 1

## 2015-01-11 MED ORDER — DEXAMETHASONE SODIUM PHOSPHATE 10 MG/ML IJ SOLN
INTRAMUSCULAR | Status: DC | PRN
  Administered 2015-01-11: 10 mg via INTRAVENOUS

## 2015-01-11 MED ORDER — ACETAMINOPHEN 325 MG PO TABS: 650.0000 mg | ORAL_TABLET | Freq: Four times a day (QID) | ORAL | Status: DC | PRN

## 2015-01-11 MED ORDER — PROPOFOL 10 MG/ML IV BOLUS
INTRAVENOUS | Status: AC
  Filled 2015-01-11: qty 20

## 2015-01-11 MED ORDER — LIDOCAINE HCL (CARDIAC) 20 MG/ML IV SOLN
INTRAVENOUS | Status: DC | PRN
  Administered 2015-01-11: 50 mg via INTRAVENOUS

## 2015-01-11 MED ORDER — BUPIVACAINE-EPINEPHRINE 0.5% -1:200000 IJ SOLN
INTRAMUSCULAR | Status: DC | PRN
  Administered 2015-01-11: 20 mL

## 2015-01-11 MED ORDER — BUPIVACAINE-EPINEPHRINE 0.5% -1:200000 IJ SOLN
INTRAMUSCULAR | Status: AC
  Filled 2015-01-11: qty 1

## 2015-01-11 MED ORDER — GLYCOPYRROLATE 0.2 MG/ML IJ SOLN
INTRAMUSCULAR | Status: DC | PRN
  Administered 2015-01-11: 0.4 mg via INTRAVENOUS

## 2015-01-11 MED ORDER — DEXAMETHASONE SODIUM PHOSPHATE 10 MG/ML IJ SOLN
INTRAMUSCULAR | Status: AC
  Filled 2015-01-11: qty 1

## 2015-01-11 MED ORDER — HYDROCODONE-ACETAMINOPHEN 5-325 MG PO TABS
1.0000 | ORAL_TABLET | ORAL | Status: DC | PRN
  Administered 2015-01-11 – 2015-01-12 (×2): 1 via ORAL
  Filled 2015-01-11 (×2): qty 1

## 2015-01-11 MED ORDER — LIDOCAINE HCL (CARDIAC) 20 MG/ML IV SOLN
INTRAVENOUS | Status: AC
  Filled 2015-01-11: qty 5

## 2015-01-11 MED ORDER — LACTATED RINGERS IV SOLN: INTRAVENOUS | Status: DC

## 2015-01-11 MED ORDER — ROCURONIUM BROMIDE 100 MG/10ML IV SOLN
INTRAVENOUS | Status: AC
  Filled 2015-01-11: qty 1

## 2015-01-11 MED ORDER — CHLORHEXIDINE GLUCONATE 0.12 % MT SOLN
15.0000 mL | Freq: Two times a day (BID) | OROMUCOSAL | Status: DC
  Administered 2015-01-11: 15 mL via OROMUCOSAL
  Filled 2015-01-11: qty 15

## 2015-01-11 MED ORDER — LACTATED RINGERS IV SOLN
INTRAVENOUS | Status: DC | PRN
  Administered 2015-01-11 (×2): via INTRAVENOUS

## 2015-01-11 MED ORDER — PROPOFOL 10 MG/ML IV BOLUS
INTRAVENOUS | Status: DC | PRN
  Administered 2015-01-11: 150 mg via INTRAVENOUS

## 2015-01-11 MED ORDER — IOHEXOL 300 MG/ML  SOLN
INTRAMUSCULAR | Status: DC | PRN
  Administered 2015-01-11: 12 mL

## 2015-01-11 MED ORDER — ONDANSETRON HCL 4 MG/2ML IJ SOLN: 4.0000 mg | Freq: Four times a day (QID) | INTRAMUSCULAR | Status: DC | PRN

## 2015-01-11 MED ORDER — ONDANSETRON 4 MG PO TBDP: 4.0000 mg | ORAL_TABLET | Freq: Four times a day (QID) | ORAL | Status: DC | PRN

## 2015-01-11 MED ORDER — FENTANYL CITRATE (PF) 100 MCG/2ML IJ SOLN
INTRAMUSCULAR | Status: DC | PRN
  Administered 2015-01-11: 100 ug via INTRAVENOUS
  Administered 2015-01-11 (×2): 50 ug via INTRAVENOUS

## 2015-01-11 MED ORDER — LISINOPRIL 20 MG PO TABS
20.0000 mg | ORAL_TABLET | Freq: Every day | ORAL | Status: DC
  Administered 2015-01-11: 20 mg via ORAL
  Filled 2015-01-11 (×2): qty 1

## 2015-01-11 SURGICAL SUPPLY — 32 items
APPLIER CLIP ROT 10 11.4 M/L (STAPLE) ×3
BENZOIN TINCTURE PRP APPL 2/3 (GAUZE/BANDAGES/DRESSINGS) ×3 IMPLANT
CABLE HIGH FREQUENCY MONO STRZ (ELECTRODE) ×3 IMPLANT
CHLORAPREP W/TINT 26ML (MISCELLANEOUS) ×3 IMPLANT
CLIP APPLIE ROT 10 11.4 M/L (STAPLE) ×1 IMPLANT
CLOSURE WOUND 1/2 X4 (GAUZE/BANDAGES/DRESSINGS) ×1
COVER MAYO STAND STRL (DRAPES) ×3 IMPLANT
COVER SURGICAL LIGHT HANDLE (MISCELLANEOUS) ×3 IMPLANT
DECANTER SPIKE VIAL GLASS SM (MISCELLANEOUS) ×3 IMPLANT
DRAPE C-ARM 42X120 X-RAY (DRAPES) ×3 IMPLANT
DRAPE LAPAROSCOPIC ABDOMINAL (DRAPES) ×3 IMPLANT
DRSG TEGADERM 4X4.75 (GAUZE/BANDAGES/DRESSINGS) ×3 IMPLANT
ELECT REM PT RETURN 9FT ADLT (ELECTROSURGICAL) ×3
ELECTRODE REM PT RTRN 9FT ADLT (ELECTROSURGICAL) ×1 IMPLANT
GAUZE SPONGE 2X2 8PLY STRL LF (GAUZE/BANDAGES/DRESSINGS) ×1 IMPLANT
GLOVE SURG ORTHO 8.0 STRL STRW (GLOVE) ×3 IMPLANT
GOWN STRL REUS W/TWL XL LVL3 (GOWN DISPOSABLE) ×6 IMPLANT
HEMOSTAT SURGICEL 4X8 (HEMOSTASIS) IMPLANT
KIT BASIN OR (CUSTOM PROCEDURE TRAY) ×3 IMPLANT
POUCH SPECIMEN RETRIEVAL 10MM (ENDOMECHANICALS) ×3 IMPLANT
SCISSORS LAP 5X35 DISP (ENDOMECHANICALS) ×3 IMPLANT
SET CHOLANGIOGRAPH MIX (MISCELLANEOUS) ×3 IMPLANT
SET IRRIG TUBING LAPAROSCOPIC (IRRIGATION / IRRIGATOR) ×3 IMPLANT
SLEEVE XCEL OPT CAN 5 100 (ENDOMECHANICALS) ×3 IMPLANT
SPONGE GAUZE 2X2 STER 10/PKG (GAUZE/BANDAGES/DRESSINGS) ×2
STRIP CLOSURE SKIN 1/2X4 (GAUZE/BANDAGES/DRESSINGS) ×2 IMPLANT
SUT MNCRL AB 4-0 PS2 18 (SUTURE) ×3 IMPLANT
TOWEL OR 17X26 10 PK STRL BLUE (TOWEL DISPOSABLE) ×3 IMPLANT
TRAY LAPAROSCOPIC (CUSTOM PROCEDURE TRAY) ×3 IMPLANT
TROCAR BLADELESS OPT 5 100 (ENDOMECHANICALS) ×3 IMPLANT
TROCAR XCEL BLUNT TIP 100MML (ENDOMECHANICALS) ×3 IMPLANT
TROCAR XCEL NON-BLD 11X100MML (ENDOMECHANICALS) ×3 IMPLANT

## 2015-01-11 NOTE — Interval H&P Note (Signed)
History and Physical Interval Note:  01/11/2015 7:24 AM  Sarah Manning  has presented today for surgery, with the diagnosis of symptomatic cholelithiasis.  The various methods of treatment have been discussed with the patient and family. After consideration of risks, benefits and other options for treatment, the patient has consented to    Procedure(s): LAPAROSCOPIC CHOLECYSTECTOMY WITH INTRAOPERATIVE CHOLANGIOGRAM (N/A) as a surgical intervention .    The patient's history has been reviewed, patient examined, no change in status, stable for surgery.  I have reviewed the patient's chart and labs.  Questions were answered to the patient's satisfaction.    Earnstine Regal, MD, [facility] Office: [place]

## 2015-01-11 NOTE — Anesthesia Postprocedure Evaluation (Signed)
  Anesthesia Post-op Note  Patient: Sarah Manning  Procedure(s) Performed: Procedure(s) (LRB): LAPAROSCOPIC CHOLECYSTECTOMY WITH INTRAOPERATIVE CHOLANGIOGRAM (N/A)  Patient Location: PACU  Anesthesia Type: General  Level of Consciousness: awake and alert   Airway and Oxygen Therapy: Patient Spontanous Breathing  Post-op Pain: mild  Post-op Assessment: Post-op Vital signs reviewed, Patient's Cardiovascular Status Stable, Respiratory Function Stable, Patent Airway and No signs of Nausea or vomiting  Last Vitals:  Filed Vitals:   01/11/15 1029  BP: 153/65  Pulse: 64  Temp: 36.6 C  Resp: 16    Post-op Vital Signs: stable   Complications: No apparent anesthesia complications

## 2015-01-11 NOTE — Op Note (Signed)
Procedure Note  Pre-operative Diagnosis:  Symptomatic cholelithiasis   Post-operative Diagnosis:  same  Surgeon:  Earnstine Regal, MD, FACS  Assistant:  none   Procedure:  Laparoscopic cholecystectomy with intra-operative cholangiography  Anesthesia:  General  Estimated Blood Loss:  minimal  Drains: none         Specimen: Gallbladder to pathology  Indications:  Patient presents repeatedly to the ER with abdominal pain.  USN demonstrates multiple gallstones.  Now for lap chole.  Procedure Details:  The patient was seen in the pre-op holding area. The risks, benefits, complications, treatment options, and expected outcomes were previously discussed with the patient. The patient agreed with the proposed plan and has signed the informed consent form.  The patient was brought to the Operating Room, identified as Sarah Manning and the procedure verified as laparoscopic cholecystectomy with intraoperative cholangiography. A "time out" was completed and the above information confirmed.  The patient was placed in the supine position. Following induction of general anesthesia, the abdomen was prepped and draped in the usual aseptic fashion.  An incision was made in the skin near the umbilicus. The midline fascia was incised and the peritoneal cavity was entered and a Hasson canula was introduced under direct vision.  The Hasson canula was secured with a 0-Vicryl pursestring suture. Pneumoperitoneum was established with carbon dioxide. Additional trocars were introduced under direct vision along the right costal margin in the midline, mid-clavicular line, and anterior axillary line.   The gallbladder was identified and the fundus grasped and retracted cephalad. Adhesions were taken down bluntly and the electrocautery was utilized as needed, taking care not to injure any adjacent structures. The infundibulum was grasped and retracted laterally, exposing the peritoneum overlying the triangle of  Calot. The peritoneum was incised and structures exposed with blunt dissection. The cystic duct was clearly identified, bluntly dissected circumferentially, and clipped at the neck of the gallbladder.  An incision was made in the cystic duct and the cholangiogram catheter introduced. The catheter was secured using an ligaclip.  Real-time cholangiography was performed using C-arm fluoroscopy.  There was rapid filling of a normal caliber common bile duct.  There was reflux of contrast into the left and right hepatic ductal systems.  There was free flow distally into the duodenum without filling defect or obstruction.  The catheter was removed from the peritoneal cavity.  The cystic duct was then ligated with surgical clips and divided. The cystic artery was identified, dissected circumferentially, ligated with ligaclips, and divided.  The gallbladder was dissected away from the liver bed using the electrocautery for hemostasis. The gallbladder was completely removed from the liver and placed into an endocatch bag. The gallbladder was removed in the endocatch bag through the umbilical port site and submitted to pathology for review.  The right upper quadrant was irrigated and the gallbladder bed was inspected. Hemostasis was achieved with the electrocautery.  Pneumoperitoneum was released after viewing removal of the trocars with good hemostasis noted. The umbilical wound was irrigated and the fascia was then closed with the pursestring suture.  Local anesthetic was infiltrated at all port sites. The skin incisions were closed with 4-0 Monocril subcuticular sutures and steri-strips and dressings were applied.  Instrument, sponge, and needle counts were correct at the conclusion of the case.  The patient was awakened from anesthesia and brought to the recovery room in stable condition.  The patient tolerated the procedure well.   Earnstine Regal, MD, [facility] Office:  [phone]   

## 2015-01-11 NOTE — Anesthesia Preprocedure Evaluation (Signed)
Anesthesia Evaluation  Patient identified by MRN, date of birth, ID band Patient awake    Reviewed: Allergy & Precautions, H&P , NPO status , Patient's Chart, lab work & pertinent test results  Airway Mallampati: II  TM Distance: >3 FB Neck ROM: full    Dental no notable dental hx. (+) Dental Advisory Given   Pulmonary neg pulmonary ROS,  breath sounds clear to auscultation  Pulmonary exam normal       Cardiovascular Exercise Tolerance: Good hypertension, Pt. on medications Normal cardiovascular examRhythm:regular Rate:Normal     Neuro/Psych negative neurological ROS  negative psych ROS   GI/Hepatic negative GI ROS, Neg liver ROS,   Endo/Other  negative endocrine ROSHypothyroidism   Renal/GU negative Renal ROS  negative genitourinary   Musculoskeletal   Abdominal   Peds  Hematology negative hematology ROS (+) anemia , hgb 10.3   Anesthesia Other Findings   Reproductive/Obstetrics negative OB ROS                             Anesthesia Physical Anesthesia Plan  ASA: III  Anesthesia Plan: General   Post-op Pain Management:    Induction: Intravenous  Airway Management Planned: Oral ETT  Additional Equipment:   Intra-op Plan:   Post-operative Plan: Extubation in OR  Informed Consent: I have reviewed the patients History and Physical, chart, labs and discussed the procedure including the risks, benefits and alternatives for the proposed anesthesia with the patient or authorized representative who has indicated his/her understanding and acceptance.   Dental Advisory Given  Plan Discussed with: CRNA and Surgeon  Anesthesia Plan Comments:         Anesthesia Quick Evaluation

## 2015-01-11 NOTE — Anesthesia Procedure Notes (Signed)
Procedure Name: Intubation Date/Time: 01/11/2015 7:45 AM Performed by: Noralyn Pick D Pre-anesthesia Checklist: Patient identified, Emergency Drugs available, Suction available and Patient being monitored Patient Re-evaluated:Patient Re-evaluated prior to inductionOxygen Delivery Method: Circle System Utilized Preoxygenation: Pre-oxygenation with 100% oxygen Intubation Type: IV induction Ventilation: Mask ventilation without difficulty Laryngoscope Size: Mac and 3 Grade View: Grade II Tube type: Oral Number of attempts: 1 Airway Equipment and Method: Stylet and Oral airway Placement Confirmation: ETT inserted through vocal cords under direct vision,  positive ETCO2 and breath sounds checked- equal and bilateral Secured at: 21 cm Tube secured with: Tape Dental Injury: Teeth and Oropharynx as per pre-operative assessment

## 2015-01-11 NOTE — Transfer of Care (Signed)
Immediate Anesthesia Transfer of Care Note  Patient: Sarah Manning  Procedure(s) Performed: Procedure(s): LAPAROSCOPIC CHOLECYSTECTOMY WITH INTRAOPERATIVE CHOLANGIOGRAM (N/A)  Patient Location: PACU  Anesthesia Type:General  Level of Consciousness: awake, alert  and oriented  Airway & Oxygen Therapy: Patient Spontanous Breathing and Patient connected to face mask oxygen  Post-op Assessment: Report given to RN and Post -op Vital signs reviewed and stable  Post vital signs: Reviewed and stable  Last Vitals:  Filed Vitals:   01/11/15 0613  BP: 167/66  Pulse: 76  Temp: 36.8 C  Resp: 20    Complications: No apparent anesthesia complications

## 2015-01-11 NOTE — Telephone Encounter (Signed)
Pt was admitted and had surgery last pm.

## 2015-01-12 ENCOUNTER — Encounter (HOSPITAL_COMMUNITY): Payer: Self-pay | Admitting: Surgery

## 2015-01-12 DIAGNOSIS — E785 Hyperlipidemia, unspecified: Secondary | ICD-10-CM | POA: Diagnosis not present

## 2015-01-12 DIAGNOSIS — K8010 Calculus of gallbladder with chronic cholecystitis without obstruction: Secondary | ICD-10-CM | POA: Diagnosis not present

## 2015-01-12 DIAGNOSIS — E039 Hypothyroidism, unspecified: Secondary | ICD-10-CM | POA: Diagnosis not present

## 2015-01-12 DIAGNOSIS — K219 Gastro-esophageal reflux disease without esophagitis: Secondary | ICD-10-CM | POA: Diagnosis not present

## 2015-01-12 DIAGNOSIS — I10 Essential (primary) hypertension: Secondary | ICD-10-CM | POA: Diagnosis not present

## 2015-01-12 DIAGNOSIS — R1011 Right upper quadrant pain: Secondary | ICD-10-CM | POA: Diagnosis not present

## 2015-01-12 DIAGNOSIS — I5189 Other ill-defined heart diseases: Secondary | ICD-10-CM | POA: Diagnosis not present

## 2015-01-12 DIAGNOSIS — G629 Polyneuropathy, unspecified: Secondary | ICD-10-CM | POA: Diagnosis not present

## 2015-01-12 DIAGNOSIS — D649 Anemia, unspecified: Secondary | ICD-10-CM | POA: Diagnosis not present

## 2015-01-12 MED ORDER — HYDROCODONE-ACETAMINOPHEN 5-325 MG PO TABS: 1.0000 | ORAL_TABLET | ORAL | Status: DC | PRN

## 2015-01-12 MED ORDER — ACETAMINOPHEN 325 MG PO TABS: 650.0000 mg | ORAL_TABLET | Freq: Four times a day (QID) | ORAL | Status: AC | PRN

## 2015-01-12 NOTE — Discharge Instructions (Signed)
Laparoscopic Cholecystectomy, Care After °Refer to this sheet in the next few weeks. These instructions provide you with information on caring for yourself after your procedure. Your health care provider may also give you more specific instructions. Your treatment has been planned according to current medical practices, but problems sometimes occur. Call your health care provider if you have any problems or questions after your procedure. °WHAT TO EXPECT AFTER THE PROCEDURE °After your procedure, it is typical to have the following: °· Pain at your incision sites. You will be given pain medicines to control the pain. °· Mild nausea or vomiting. This should improve after the first 24 hours. °· Bloating and possibly shoulder pain from the gas used during the procedure. This will improve after the first 24 hours. °HOME CARE INSTRUCTIONS  °· Change bandages (dressings) as directed by your health care provider. °· Keep the wound dry and clean. You may wash the wound gently with soap and water. Gently blot or dab the area dry. °· Do not take baths or use swimming pools or hot tubs for 2 weeks or until your health care provider approves. °· Only take over-the-counter or prescription medicines as directed by your health care provider. °· Continue your normal diet as directed by your health care provider. °· Do not lift anything heavier than 10 pounds (4.5 kg) until your health care provider approves. °· Do not play contact sports for 1 week or until your health care provider approves. °SEEK MEDICAL CARE IF:  °· You have redness, swelling, or increasing pain in the wound. °· You notice yellowish-white fluid (pus) coming from the wound. °· You have drainage from the wound that lasts longer than 1 day. °· You notice a bad smell coming from the wound or dressing. °· Your surgical cuts (incisions) break open. °SEEK IMMEDIATE MEDICAL CARE IF:  °· You develop a rash. °· You have difficulty breathing. °· You have chest pain. °· You  have a fever. °· You have increasing pain in the shoulders (shoulder strap areas). °· You have dizzy episodes or faint while standing. °· You have severe abdominal pain. °· You feel sick to your stomach (nauseous) or throw up (vomit) and this lasts for more than 1 day. °Document Released: 05/19/2005 Document Revised: 03/09/2013 Document Reviewed: 12/29/2012 °Exit[employer]® Patient Information ©2015 [employer]. This information is not intended to replace advice given to you by your health care provider. Make sure you discuss any questions you have with your health care provider. ° °[facility] ______[facility] °LAPAROSCOPIC SURGERY: POST OP INSTRUCTIONS °Always review your discharge instruction sheet given to you by the facility where your surgery was performed. °IF YOU HAVE DISABILITY OR FAMILY LEAVE FORMS, YOU MUST BRING THEM TO THE OFFICE FOR PROCESSING.   °DO NOT GIVE THEM TO YOUR DOCTOR. ° °1. A prescription for pain medication may be given to you upon discharge.  Take your pain medication as prescribed, if needed.  If narcotic pain medicine is not needed, then you may take acetaminophen (Tylenol) or ibuprofen (Advil) as needed. °2. Take your usually prescribed medications unless otherwise directed. °3. If you need a refill on your pain medication, please contact your pharmacy.  They will contact our office to request authorization. Prescriptions will not be filled after 5pm or on week-ends. °4. You should follow a light diet the first few days after arrival home, such as soup and crackers, etc.  Be sure to include lots of fluids daily. °5. Most patients will experience some   swelling and bruising in the area of the incisions.  Ice packs will help.  Swelling and bruising can take several days to resolve.  °6. It is common to experience some constipation if taking pain medication after surgery.  Increasing fluid intake and taking a stool softener (such as Colace) will usually help or prevent this problem  from occurring.  A mild laxative (Milk of Magnesia or Miralax) should be taken according to package instructions if there are no bowel movements after 48 hours. °7. Unless discharge instructions indicate otherwise, you may remove your bandages 24-48 hours after surgery, and you may shower at that time.  You may have steri-strips (small skin tapes) in place directly over the incision.  These strips should be left on the skin for 7-10 days.  If your surgeon used skin glue on the incision, you may shower in 24 hours.  The glue will flake off over the next 2-3 weeks.  Any sutures or staples will be removed at the office during your follow-up visit. °8. ACTIVITIES:  You may resume regular (light) daily activities beginning the next day--such as daily self-care, walking, climbing stairs--gradually increasing activities as tolerated.  You may have sexual intercourse when it is comfortable.  Refrain from any heavy lifting or straining until approved by your doctor. °a. You may drive when you are no longer taking prescription pain medication, you can comfortably wear a seatbelt, and you can safely maneuver your car and apply brakes. °b. RETURN TO WORK:  __________________________________________________________ °9. You should see your doctor in the office for a follow-up appointment approximately 2-3 weeks after your surgery.  Make sure that you call for this appointment within a day or two after you arrive home to insure a convenient appointment time. °10. OTHER INSTRUCTIONS: __________________________________________________________________________________________________________________________ __________________________________________________________________________________________________________________________ °WHEN TO CALL YOUR DOCTOR: °1. Fever over 101.0 °2. Inability to urinate °3. Continued bleeding from incision. °4. Increased pain, redness, or drainage from the incision. °5. Increasing abdominal pain ° °The  clinic staff is available to answer your questions during regular business hours.  Please don’t hesitate to call and ask to speak to one of the nurses for clinical concerns.  If you have a medical emergency, go to the nearest emergency room or call 911.  A surgeon from [facility] is always on call at the hospital. °[address], [address], [place], [place]  [address] ? [address] [address], [place] , [place]   [address] °[phone] ? [phone] ? FAX [phone] °Web site: [email or web address] °

## 2015-01-12 NOTE — Progress Notes (Signed)
1 Day Post-Op  Subjective: She looks good, eating breakfast.  Sites look fine and she feels much better, just sore..  Objective: Vital signs in last 24 hours: Temp:  [97.4 F (36.3 C)-98.2 F (36.8 C)] 97.7 F (36.5 C) (08/12 0522) Pulse Rate:  [56-85] 60 (08/12 0522) Resp:  [15-18] 16 (08/12 0522) BP: (128-183)/(53-91) 128/53 mmHg (08/12 0522) SpO2:  [98 %-100 %] 98 % (08/12 0522) Last BM Date: 01/09/15 660 PO  Good urine output Afebrile, VSS No laBS Regular diet Intake/Output from previous day: 08/11 0701 - 08/12 0700 In: 2861.7 [P.O.:660; I.V.:2201.7] Out: 950 [Urine:950] Intake/Output this shift:    General appearance: alert, cooperative and no distress Resp: clear to auscultation bilaterally GI: soft, sore, sites look fine and she is eating.    Lab Results:   Recent Labs  01/10/15 0939 01/11/15 0520  WBC 4.1 4.5  HGB 12.2 10.3*  HCT 37.0 32.2*  PLT 306 234    BMET  Recent Labs  01/10/15 0939 01/10/15 1506 01/11/15 0520  NA 140  --  140  K 4.1  --  4.1  CL 103  --  107  CO2 27  --  29  GLUCOSE 95  --  93  BUN 9  --  8  CREATININE 0.85 0.81 0.66  CALCIUM 9.3  --  8.5*   PT/INR  Recent Labs  01/10/15 1506  LABPROT 13.9  INR 1.05     Recent Labs Lab 01/07/15 0836 01/10/15 0939 01/11/15 0520  AST 20 22 19   ALT 15 15 13*  ALKPHOS 45 48 40  BILITOT 0.7 0.6 0.4  PROT 7.1 7.5 6.2*  ALBUMIN 3.6 4.1 3.1*     Lipase     Component Value Date/Time   LIPASE 15* 01/11/2015 0520     Studies/Results: Dg Cholangiogram Operative  01/11/2015   CLINICAL DATA:  Cholecystectomy for cholelithiasis.  EXAM: INTRAOPERATIVE CHOLANGIOGRAM  TECHNIQUE: Cholangiographic images from the C-arm fluoroscopic device were submitted for interpretation post-operatively. Please see the procedural report for the amount of contrast and the fluoroscopy time utilized.  COMPARISON:  Abdominal ultrasound on 01/10/2015  FINDINGS: Intraoperative imaging with a C-arm shows  a normal opacified common bile duct with contrast entering the duodenum normally. Visualized intrahepatic ducts are unremarkable. No contrast extravasation identified  IMPRESSION: Normal intraoperative cholangiogram.   Electronically Signed   By: Aletta Edouard M.D.   On: 01/11/2015 08:54   US Abdomen Limited Ruq  01/10/2015   CLINICAL DATA:  Right upper quadrant abdominal pain for 2 months.  EXAM: US ABDOMEN LIMITED - RIGHT UPPER QUADRANT  COMPARISON:  CT scan of December 28, 2014.  FINDINGS: Gallbladder:  Multiple gallstones are noted with the largest measuring 7 mm. No gallbladder wall thickening or pericholecystic fluid is noted. Sonographic Murphy's sign could not be assessed as the patient has received pain medication.  Common bile duct:  Diameter: 6.9 mm which is mildly dilated. Correlation with liver function tests is recommended.  Liver:  No focal lesion identified. Within normal limits in parenchymal echogenicity. No intrahepatic biliary dilatation is noted.  IMPRESSION: Cholelithiasis is noted without evidence of cholecystitis. Minimal dilatation of distal common bile duct is noted without intrahepatic biliary dilatation ; correlation with liver function tests is recommended to rule out obstruction.   Electronically Signed   By: Marijo Conception, M.D.   On: 01/10/2015 11:42    Medications: . lisinopril  20 mg Oral Daily    Assessment/Plan Symptomatic cholelithiasis S/p Laparoscopic cholecystectomy  with intra-operative cholangiography, 01/11/15, DR. GERKIN Hypertension Hypothyroid Hx of mild AR/MR/grade 1 diastolic dysfunction Peripheral neuropathy with some gait instability Antibiotics:  None DVT:  SCD    Plan:  Saline lock IV, mobilize, try PO pain med and home later after lunch.  Walk with husband she has some stability issues.   LOS: 2 days    Sarah Manning 01/12/2015

## 2015-01-12 NOTE — Discharge Summary (Signed)
Physician Discharge Summary [facility]  Patient ID: Sarah Manning MRN: [id number] DOB/AGE: 10/18/1924 81 y.o.  Admit date: 01/10/2015 Discharge date: 01/12/2015  Admission Diagnoses:  abd pain, chronic cholecystitis, cholelithiasis  Discharge Diagnoses:  Active Problems:   Symptomatic cholelithiasis   Chronic cholecystitis with calculus   Discharged Condition: good  Hospital Course: Patient was admitted for observation following gallbladder surgery.  Post op course was uncomplicated.  Pain was well controlled.  Tolerated diet. Patient was prepared for discharge home on POD#1.  Consults: None  Treatments: surgery: lap chole with IOC  Discharge Exam: Blood pressure 128/53, pulse 60, temperature 97.7 F (36.5 C), temperature source Tympanic, resp. rate 16, height 5\' 5"  (1.651 m), weight 58.9 kg (129 lb 13.6 oz), SpO2 98 %. HEENT - clear Neck - soft Chest - clear bilaterally Cor - RRR Abd - soft, dressings dry, mild tenderness  Disposition: Home  Discharge Instructions    Diet - low sodium heart healthy    Complete by:  As directed      Discharge instructions    Complete by:  As directed   [facility]  LAPAROSCOPIC SURGERY:  POST-OP INSTRUCTIONS  Always review your discharge instruction sheet given to you by the facility where your surgery was performed.  A prescription for pain medication may be given to you upon discharge.  Take your pain medication as prescribed.  If narcotic pain medicine is not needed, then you may take acetaminophen (Tylenol) or ibuprofen (Advil) as needed.  Take your usually prescribed medications unless otherwise directed.  If you need a refill on your pain medication, please contact your pharmacy.  They will contact our office to request authorization. Prescriptions will not be filled after 5 P.M. or on weekends.  You should follow a light diet the first few days after arrival home, such as soup and  crackers or toast.  Be sure to include plenty of fluids daily.  Most patients will experience some swelling and bruising in the area of the incisions.  Ice packs will help.  Swelling and bruising can take several days to resolve.   It is common to experience some constipation after surgery.  Increasing fluid intake and taking a stool softener (such as Colace) will usually help or prevent this problem from occurring.  A mild laxative (Milk of Magnesia or Miralax) should be taken according to package instructions if there has been no bowel movement after 48 hours.  You will have steri-strips and a gauze dressing over your incisions.  You may remove the gauze bandage on the second day after surgery, and you may shower at that time.  Leave your steri-strips (small skin tapes) in place directly over the incision.  These strips should remain on the skin for 5-7 days and then be removed.  You may get them wet in the shower and pat them dry.  Any sutures or staples will be removed at the office during your follow-up visit.  ACTIVITIES:  You may resume regular (light) daily activities beginning the next day - such as daily self-care, walking, climbing stairs - gradually increasing activities as tolerated.  You may have sexual intercourse when it is comfortable.  Refrain from any heavy lifting or straining until approved by your doctor.  You may drive when you are no longer taking prescription pain medication, you can comfortably wear a seatbelt, and you can safely maneuver your car and apply brakes.  You should see your doctor in the office  for a follow-up appointment approximately 2-3 weeks after your surgery.  Make sure that you call for this appointment within a day or two after you arrive home to insure a convenient appointment time.  WHEN TO CALL YOUR DOCTOR: Fever over 101.0 Inability to urinate Continued bleeding from incision Increased pain, redness, or drainage from the incision Increasing  abdominal pain  The clinic staff is available to answer your questions during regular business hours.  Please don't hesitate to call and ask to speak to one of the nurses for clinical concerns.  If you have a medical emergency, go to the nearest emergency room or call 911.  A surgeon from [facility] is always on call for the hospital.  Earnstine Regal, MD, [facility] Office: [place] Free:  [place] [phone]  Website: [email or web address]     Increase activity slowly    Complete by:  As directed      Remove dressing in 24 hours    Complete by:  As directed             Medication List    TAKE these medications        acetaminophen 325 MG tablet  Commonly known as:  TYLENOL  Take 2 tablets (650 mg total) by mouth every 6 (six) hours as needed for mild pain (or temp > 100).     CRESTOR 5 MG tablet  Generic drug:  rosuvastatin  Take 5 mg by mouth at bedtime.     HYDROcodone-acetaminophen 5-325 MG per tablet  Commonly known as:  NORCO/VICODIN  Take 1-2 tablets by mouth every 4 (four) hours as needed for moderate pain.     HYDROcodone-acetaminophen 5-325 MG per tablet  Commonly known as:  NORCO/VICODIN  Take 1-2 tablets by mouth every 4 (four) hours as needed for moderate pain.     hyoscyamine 0.125 MG SL tablet  Commonly known as:  LEVSIN SL  Take 1 tablet (0.125 mg total) by mouth every 6 (six) hours as needed.     lisinopril 20 MG tablet  Commonly known as:  PRINIVIL,ZESTRIL  Take 20 mg by mouth daily.     LORazepam 0.5 MG tablet  Commonly known as:  ATIVAN  Take 0.5 mg by mouth every 4 (four) hours as needed for anxiety.     LYRICA 50 MG capsule  Generic drug:  pregabalin  Take 50 mg by mouth at bedtime.     ondansetron 4 MG tablet  Commonly known as:  ZOFRAN  Take 1 tablet (4 mg total) by mouth every 6 (six) hours.     ranitidine 150 MG tablet  Commonly known as:  ZANTAC  Take 1  tablet (150 mg total) by mouth at bedtime.     rifaximin 550 MG Tabs tablet  Commonly known as:  XIFAXAN  Take 1 tablet (550 mg total) by mouth 3 (three) times daily. For 14 days     saccharomyces boulardii 250 MG capsule  Commonly known as:  FLORASTOR  Take 1 capsule (250 mg total) by mouth 2 (two) times daily.     SYNTHROID 125 MCG tablet  Generic drug:  levothyroxine  Take 125 mcg by mouth daily.           Follow-up Information    Call Jerlyn Ly, MD.   Specialty:  Internal Medicine   Why:  Let him see you and review your medicines, I don't think you will need all this stuff for your  abdominal discomfort now.   Contact information:   [address] [place] [address] [phone]       Follow up with [place] In 2 weeks.   Why:  The office should call you with an appointment in about 3 weeks.  If you don't hear by Monday, call and ask for DOW appointment in 3 weeks.   Contact information:   [place] [id number] [place], MD, [facility] Office: [phone]   Signed: Earnstine Regal 01/12/2015, 9:07 AM

## 2015-01-15 ENCOUNTER — Ambulatory Visit: Payer: Commercial Managed Care - HMO | Admitting: Physician Assistant

## 2015-01-18 ENCOUNTER — Emergency Department (HOSPITAL_COMMUNITY)
Admission: EM | Admit: 2015-01-18 | Discharge: 2015-01-18 | Disposition: A | Discharge status: Home / Self Care | Payer: Commercial Managed Care - HMO | Attending: Emergency Medicine | Admitting: Emergency Medicine

## 2015-01-18 ENCOUNTER — Encounter (HOSPITAL_COMMUNITY): Payer: Self-pay

## 2015-01-18 ENCOUNTER — Emergency Department (HOSPITAL_COMMUNITY): Payer: Commercial Managed Care - HMO

## 2015-01-18 DIAGNOSIS — R197 Diarrhea, unspecified: Secondary | ICD-10-CM | POA: Insufficient documentation

## 2015-01-18 DIAGNOSIS — R1010 Upper abdominal pain, unspecified: Secondary | ICD-10-CM | POA: Diagnosis not present

## 2015-01-18 DIAGNOSIS — R1013 Epigastric pain: Secondary | ICD-10-CM

## 2015-01-18 DIAGNOSIS — Z9049 Acquired absence of other specified parts of digestive tract: Secondary | ICD-10-CM | POA: Diagnosis not present

## 2015-01-18 DIAGNOSIS — Z86018 Personal history of other benign neoplasm: Secondary | ICD-10-CM | POA: Diagnosis not present

## 2015-01-18 DIAGNOSIS — Z8719 Personal history of other diseases of the digestive system: Secondary | ICD-10-CM | POA: Insufficient documentation

## 2015-01-18 DIAGNOSIS — K5730 Diverticulosis of large intestine without perforation or abscess without bleeding: Secondary | ICD-10-CM | POA: Diagnosis not present

## 2015-01-18 DIAGNOSIS — R1084 Generalized abdominal pain: Secondary | ICD-10-CM | POA: Insufficient documentation

## 2015-01-18 DIAGNOSIS — Z8739 Personal history of other diseases of the musculoskeletal system and connective tissue: Secondary | ICD-10-CM | POA: Insufficient documentation

## 2015-01-18 DIAGNOSIS — R42 Dizziness and giddiness: Secondary | ICD-10-CM | POA: Insufficient documentation

## 2015-01-18 DIAGNOSIS — J449 Chronic obstructive pulmonary disease, unspecified: Secondary | ICD-10-CM | POA: Diagnosis not present

## 2015-01-18 DIAGNOSIS — I10 Essential (primary) hypertension: Secondary | ICD-10-CM | POA: Insufficient documentation

## 2015-01-18 DIAGNOSIS — Z880 Allergy status to penicillin: Secondary | ICD-10-CM | POA: Diagnosis not present

## 2015-01-18 DIAGNOSIS — E039 Hypothyroidism, unspecified: Secondary | ICD-10-CM | POA: Insufficient documentation

## 2015-01-18 DIAGNOSIS — R011 Cardiac murmur, unspecified: Secondary | ICD-10-CM | POA: Diagnosis not present

## 2015-01-18 DIAGNOSIS — R109 Unspecified abdominal pain: Secondary | ICD-10-CM | POA: Diagnosis present

## 2015-01-18 DIAGNOSIS — Z79899 Other long term (current) drug therapy: Secondary | ICD-10-CM | POA: Insufficient documentation

## 2015-01-18 LAB — COMPREHENSIVE METABOLIC PANEL
ALBUMIN: 3.7 g/dL (ref 3.5–5.0)
ALK PHOS: 55 U/L (ref 38–126)
ALT: 24 U/L (ref 14–54)
ANION GAP: 9 (ref 5–15)
AST: 23 U/L (ref 15–41)
BILIRUBIN TOTAL: 0.7 mg/dL (ref 0.3–1.2)
BUN: 5 mg/dL — AB (ref 6–20)
CALCIUM: 9.1 mg/dL (ref 8.9–10.3)
CO2: 29 mmol/L (ref 22–32)
Chloride: 100 mmol/L — ABNORMAL LOW (ref 101–111)
Creatinine, Ser: 0.74 mg/dL (ref 0.44–1.00)
GFR calc Af Amer: >60 mL/min (ref >60)
GFR calc non Af Amer: >60 mL/min (ref >60)
GLUCOSE: 103 mg/dL — AB (ref 65–99)
Potassium: 4.2 mmol/L (ref 3.5–5.1)
Sodium: 138 mmol/L (ref 135–145)
Total Protein: 7.3 g/dL (ref 6.5–8.1)

## 2015-01-18 LAB — CBC
HCT: 36.5 % (ref 36.0–46.0)
Hemoglobin: 11.9 g/dL — ABNORMAL LOW (ref 12.0–15.0)
MCH: 29 pg (ref 26.0–34.0)
MCHC: 32.6 g/dL (ref 30.0–36.0)
MCV: 89 fL (ref 78.0–100.0)
PLATELETS: 304 10*3/uL (ref 150–400)
RBC: 4.1 MIL/uL (ref 3.87–5.11)
RDW: 13.4 % (ref 11.5–15.5)
WBC: 5.1 10*3/uL (ref 4.0–10.5)

## 2015-01-18 LAB — URINALYSIS, ROUTINE W REFLEX MICROSCOPIC
Bilirubin Urine: NEGATIVE
GLUCOSE, UA: NEGATIVE mg/dL
HGB URINE DIPSTICK: NEGATIVE
KETONES UR: NEGATIVE mg/dL
Nitrite: NEGATIVE
PROTEIN: NEGATIVE mg/dL
Specific Gravity, Urine: 1.007 (ref 1.005–1.030)
Urobilinogen, UA: 0.2 mg/dL (ref 0.0–1.0)
pH: 7 (ref 5.0–8.0)

## 2015-01-18 LAB — URINE MICROSCOPIC-ADD ON

## 2015-01-18 LAB — LIPASE, BLOOD: Lipase: 14 U/L — ABNORMAL LOW (ref 22–51)

## 2015-01-18 MED ORDER — IOHEXOL 300 MG/ML  SOLN
100.0000 mL | Freq: Once | INTRAMUSCULAR | Status: AC | PRN
  Administered 2015-01-18: 100 mL via INTRAVENOUS

## 2015-01-18 MED ORDER — IOHEXOL 300 MG/ML  SOLN
50.0000 mL | Freq: Once | INTRAMUSCULAR | Status: AC | PRN
  Administered 2015-01-18: 50 mL via ORAL

## 2015-01-18 NOTE — Discharge Instructions (Signed)

## 2015-01-18 NOTE — ED Notes (Signed)
RN to draw labs. 

## 2015-01-18 NOTE — ED Notes (Signed)
Pt c/o upper abdominal pain x 1 day and dizziness and diarrhea starting this morning.  Pain score 8/10.  Pt had gallbladder removed on 8/10 and sts she has been taking all medications as prescribed.

## 2015-01-18 NOTE — ED Provider Notes (Signed)
CSN: [id number]     Arrival date & time 01/18/15  1751 History   First MD Initiated Contact with Patient 01/18/15 1839     Chief Complaint  Patient presents with  . Dizziness  . Abdominal Pain  . Diarrhea     (Consider location/radiation/quality/duration/timing/severity/associated sxs/prior Treatment) HPI Comments: 81 year old female with history of hypertension and recent cholecystectomy who presents with abdominal pain and diarrhea. On 8/10, the patient had an uncomplicated laparoscopic cholecystectomy which went well. She has been recovering appropriately at home. Yesterday evening approximately one hour after dinner, she began having mid epigastric pain that took several hours to resolve. Today, she had another episode of midepigastric pain this afternoon but it was not associated with a meal. She has had nausea today but no vomiting. She has had intermittent episodes of diarrhea, once 2 days ago and again this afternoon. No blood in her stool. She denies any fevers, urinary symptoms, chest pain, shortness of breath. She denies any pain currently. She called her surgery clinic and they recommended that she report to the ED for evaluation.  Patient is a 81 y.o. female presenting with dizziness, abdominal pain, and diarrhea. The history is provided by the patient.  Dizziness Associated symptoms: diarrhea   Abdominal Pain Associated symptoms: diarrhea   Diarrhea Associated symptoms: abdominal pain     Past Medical History  Diagnosis Date  . Hypertension   . Autoimmune disease     positive ANA, double stranded DNA, anti-RO and anti- LA  . Osteoporosis   . Colonic adenoma   . Hypothyroidism   . COPD (chronic obstructive pulmonary disease)     non smoker  . Heart murmur   . Diverticulosis   . Cholelithiasis    Past Surgical History  Procedure Laterality Date  . Cholecystectomy N/A 01/11/2015    Procedure: LAPAROSCOPIC CHOLECYSTECTOMY WITH INTRAOPERATIVE CHOLANGIOGRAM;  Surgeon:  Armandina Gemma, MD;  Location: [facility];  Service: General;  Laterality: N/A;   Family History  Problem Relation Age of Onset  . Heart disease Father   . Stomach cancer Brother   . Prostate cancer Brother   . Alzheimer's disease Mother    Social History  Substance Use Topics  . Smoking status: Never Smoker   . Smokeless tobacco: Never Used  . Alcohol Use: 0.0 oz/week    0 Standard drinks or equivalent per week     Comment: Occ. alcohol   OB History    No data available     Review of Systems  Gastrointestinal: Positive for abdominal pain and diarrhea.  Neurological: Positive for dizziness.   10 Systems reviewed and are negative for acute change except as noted in the HPI.    Allergies  Aspirin; Amoxicillin; and Penicillins  Home Medications   Prior to Admission medications   Medication Sig Start Date End Date Taking? Authorizing Provider  acetaminophen (TYLENOL) 325 MG tablet Take 2 tablets (650 mg total) by mouth every 6 (six) hours as needed for mild pain (or temp > 100). 01/12/15  Yes Earnstine Regal, PA-C  CRESTOR 5 MG tablet Take 5 mg by mouth at bedtime.  10/31/14  Yes Historical Provider, MD  hyoscyamine (LEVSIN SL) 0.125 MG SL tablet Take 1 tablet (0.125 mg total) by mouth every 6 (six) hours as needed. Patient taking differently: Take 0.125 mg by mouth every 6 (six) hours as needed for cramping.  12/26/14  Yes Amy S Esterwood, PA-C  lisinopril (PRINIVIL,ZESTRIL) 20 MG tablet Take 20 mg by mouth daily. 10/11/14  Yes Historical Provider, MD  LORazepam (ATIVAN) 0.5 MG tablet Take 0.5 mg by mouth every 4 (four) hours as needed for anxiety.  12/19/14  Yes Historical Provider, MD  LYRICA 50 MG capsule Take 50 mg by mouth at bedtime.  10/03/14  Yes Historical Provider, MD  ondansetron (ZOFRAN) 4 MG tablet Take 1 tablet (4 mg total) by mouth every 6 (six) hours. Patient taking differently: Take 4 mg by mouth every 6 (six) hours as needed for nausea or vomiting.  12/29/14  Yes Geronimo Boot, MD  SYNTHROID 125 MCG tablet Take 125 mcg by mouth daily. 09/22/14  Yes Historical Provider, MD  HYDROcodone-acetaminophen (NORCO/VICODIN) 5-325 MG per tablet Take 1-2 tablets by mouth every 4 (four) hours as needed for moderate pain. Patient not taking: Reported on 01/18/2015 01/12/15   Earnstine Regal, PA-C  HYDROcodone-acetaminophen (NORCO/VICODIN) 5-325 MG per tablet Take 1-2 tablets by mouth every 4 (four) hours as needed for moderate pain. Patient not taking: Reported on 01/18/2015 01/12/15   Armandina Gemma, MD  ranitidine (ZANTAC) 150 MG tablet Take 1 tablet (150 mg total) by mouth at bedtime. Patient not taking: Reported on 01/10/2015 01/02/15   Cecille Rubin P Hvozdovic, PA-C  rifaximin (XIFAXAN) 550 MG TABS tablet Take 1 tablet (550 mg total) by mouth 3 (three) times daily. For 14 days Patient not taking: Reported on 01/10/2015 01/08/15   Lori P Hvozdovic, PA-C  saccharomyces boulardii (FLORASTOR) 250 MG capsule Take 1 capsule (250 mg total) by mouth 2 (two) times daily. Patient not taking: Reported on 01/18/2015 12/26/14   Amy S Esterwood, PA-C   BP 145/66 mmHg  Pulse 75  Temp(Src) 98.1 F (36.7 C) (Oral)  Resp 19  SpO2 98% Physical Exam  Constitutional: She is oriented to person, place, and time. She appears well-developed and well-nourished. No distress.  HENT:  Head: Normocephalic and atraumatic.  Moist mucous membranes  Eyes: Conjunctivae are normal. Pupils are equal, round, and reactive to light.  Neck: Neck supple.  Cardiovascular: Normal rate, regular rhythm and normal heart sounds.   No murmur heard. Pulmonary/Chest: Effort normal and breath sounds normal.  Abdominal: Soft. Bowel sounds are normal. She exhibits no distension.  Very mild generalized tenderness near abdominal incision sites but no focal tenderness in the midepigastrium, no rebound or guarding; abdominal incisions clean dry and intact  Musculoskeletal: She exhibits no edema.  Neurological: She is alert and oriented  to person, place, and time.  Fluent speech  Skin: Skin is warm and dry.  No erythema or drainage from incision sites  Psychiatric: She has a normal mood and affect. Judgment normal.  Nursing note and vitals reviewed.   ED Course  Procedures (including critical care time) Labs Review Labs Reviewed  CBC - Abnormal; Notable for the following:    Hemoglobin 11.9 (*)    All other components within normal limits  URINALYSIS, ROUTINE W REFLEX MICROSCOPIC (NOT AT [facility]) - Abnormal; Notable for the following:    Leukocytes, UA SMALL (*)    All other components within normal limits  LIPASE, BLOOD - Abnormal; Notable for the following:    Lipase 14 (*)    All other components within normal limits  COMPREHENSIVE METABOLIC PANEL - Abnormal; Notable for the following:    Chloride 100 (*)    Glucose, Bld 103 (*)    BUN 5 (*)    All other components within normal limits  URINE MICROSCOPIC-ADD ON    Imaging Review Ct Abdomen Pelvis W Contrast  01/18/2015  CLINICAL DATA:  Acute onset of upper abdominal pain. Dizziness and diarrhea. Initial encounter.  EXAM: CT ABDOMEN AND PELVIS WITH CONTRAST  TECHNIQUE: Multidetector CT imaging of the abdomen and pelvis was performed using the standard protocol following bolus administration of intravenous contrast.  CONTRAST:  169mL OMNIPAQUE IOHEXOL 300 MG/ML  SOLN  COMPARISON:  CT of the abdomen and pelvis from 12/28/2014, and right upper quadrant ultrasound performed 01/10/2015  FINDINGS: The visualized lung bases are clear.  The liver is grossly unremarkable in appearance. A few tiny hypodensities within the spleen are nonspecific and too small to further characterize. The patient is status post cholecystectomy, with clips noted at the gallbladder fossa. The pancreas and adrenal glands are unremarkable.  Mild bilateral renal scarring is noted. There is no evidence of hydronephrosis. No renal or ureteral stones are seen. No perinephric stranding is appreciated.  No  free fluid is identified. The small bowel is unremarkable in appearance. The stomach is within normal limits. No acute vascular abnormalities are seen. Diffuse calcification is seen along the abdominal aorta and its branches.  The appendix is not definitely seen; there is no evidence for appendicitis. Scattered diverticulosis is noted along the descending and proximal sigmoid colon, without evidence of diverticulitis.  The bladder is mildly distended and grossly unremarkable. The uterus is unremarkable in appearance. The ovaries are grossly symmetric. No suspicious adnexal masses are seen. No inguinal lymphadenopathy is seen.  No acute osseous abnormalities are identified. There is mild chronic deformity involving the left superior and inferior pubic rami.  IMPRESSION: 1. No acute abnormality seen to explain the patient's symptoms. 2. Diffuse calcification along the abdominal aorta and its branches. 3. Few tiny hypodensities within the spleen are too small to further characterize. 4. Scattered diverticulosis along the descending and proximal sigmoid colon, without evidence of diverticulitis.   Electronically Signed   By: Garald Balding M.D.   On: 01/18/2015 20:51   I have personally reviewed and evaluated these images and lab results as part of my medical decision-making.   EKG Interpretation   Date/Time:  Thursday January 18 2015 18:03:47 EDT Ventricular Rate:  88 PR Interval:  135 QRS Duration: 75 QT Interval:  365 QTC Calculation: 442 R Axis:   64 Text Interpretation:  Sinus rhythm No significant change since last  tracing Confirmed by Windel Keziah MD, [facility] [phone]) on 01/18/2015 10:04:09 PM      MDM   Final diagnoses:  Epigastric pain    81 year old female with recent cholecystectomy who presents with several episodes of midepigastric abdominal pain associated with nausea. No associated chest pain, shortness breath, or diaphoresis to suggest ACS. She has also had a few episodes of diarrhea.  Patient well-appearing with reassuring vital signs at presentation. No focal abdominal tenderness and patient currently states that she has no pain at rest. Obtained above lab work which showed stable anemia at hemoglobin 11.9. Because of the patient's recent surgery, obtained a CT of the abdomen and pelvis to rule out postsurgical complications such as intra-abdominal infection.  CT shows no acute findings to explain the patient's pain. On reexamination, the patient remains pain-free and has been drinking soda with no episodes of vomiting. Regarding the patient's diarrhea, it is not persistent and I feel it is likely related to her recent cholecystectomy rather than an infectious process. Patient voices desire to go home. I extensively reviewed return precautions including fever, intractable vomiting, persistently worsening pain, or any other new or alarming symptoms. The patient voiced understanding. She will  follow-up as previously scheduled in the next few days with her PCP and gastroenterologist. Patient discharged in satisfactory condition.    Sharlett Iles, MD 01/18/15 [phone]

## 2015-01-23 DIAGNOSIS — R109 Unspecified abdominal pain: Secondary | ICD-10-CM | POA: Diagnosis not present

## 2015-01-23 DIAGNOSIS — E039 Hypothyroidism, unspecified: Secondary | ICD-10-CM | POA: Diagnosis not present

## 2015-01-23 DIAGNOSIS — Z6822 Body mass index (BMI) 22.0-22.9, adult: Secondary | ICD-10-CM | POA: Diagnosis not present

## 2015-01-23 DIAGNOSIS — K219 Gastro-esophageal reflux disease without esophagitis: Secondary | ICD-10-CM | POA: Diagnosis not present

## 2015-01-23 DIAGNOSIS — I10 Essential (primary) hypertension: Secondary | ICD-10-CM | POA: Diagnosis not present

## 2015-01-24 ENCOUNTER — Ambulatory Visit: Payer: Commercial Managed Care - HMO | Admitting: Physician Assistant

## 2015-01-27 ENCOUNTER — Emergency Department (HOSPITAL_COMMUNITY)
Admission: EM | Admit: 2015-01-27 | Discharge: 2015-01-27 | Disposition: A | Discharge status: Home / Self Care | Payer: Commercial Managed Care - HMO | Attending: Emergency Medicine | Admitting: Emergency Medicine

## 2015-01-27 ENCOUNTER — Emergency Department (HOSPITAL_COMMUNITY): Payer: Commercial Managed Care - HMO

## 2015-01-27 ENCOUNTER — Emergency Department (HOSPITAL_COMMUNITY)
Admission: EM | Admit: 2015-01-27 | Discharge: 2015-01-27 | Disposition: A | Discharge status: Home / Self Care | Payer: Self-pay

## 2015-01-27 ENCOUNTER — Encounter (HOSPITAL_COMMUNITY): Payer: Self-pay | Admitting: Emergency Medicine

## 2015-01-27 DIAGNOSIS — Z79899 Other long term (current) drug therapy: Secondary | ICD-10-CM | POA: Insufficient documentation

## 2015-01-27 DIAGNOSIS — Z8739 Personal history of other diseases of the musculoskeletal system and connective tissue: Secondary | ICD-10-CM | POA: Diagnosis not present

## 2015-01-27 DIAGNOSIS — Z8719 Personal history of other diseases of the digestive system: Secondary | ICD-10-CM | POA: Diagnosis not present

## 2015-01-27 DIAGNOSIS — Z86018 Personal history of other benign neoplasm: Secondary | ICD-10-CM | POA: Insufficient documentation

## 2015-01-27 DIAGNOSIS — R1030 Lower abdominal pain, unspecified: Secondary | ICD-10-CM | POA: Diagnosis not present

## 2015-01-27 DIAGNOSIS — I10 Essential (primary) hypertension: Secondary | ICD-10-CM | POA: Insufficient documentation

## 2015-01-27 DIAGNOSIS — Z880 Allergy status to penicillin: Secondary | ICD-10-CM | POA: Insufficient documentation

## 2015-01-27 DIAGNOSIS — Z9049 Acquired absence of other specified parts of digestive tract: Secondary | ICD-10-CM | POA: Insufficient documentation

## 2015-01-27 DIAGNOSIS — R011 Cardiac murmur, unspecified: Secondary | ICD-10-CM | POA: Insufficient documentation

## 2015-01-27 DIAGNOSIS — E039 Hypothyroidism, unspecified: Secondary | ICD-10-CM | POA: Diagnosis not present

## 2015-01-27 DIAGNOSIS — R110 Nausea: Secondary | ICD-10-CM | POA: Diagnosis not present

## 2015-01-27 DIAGNOSIS — R109 Unspecified abdominal pain: Secondary | ICD-10-CM

## 2015-01-27 DIAGNOSIS — R1031 Right lower quadrant pain: Secondary | ICD-10-CM | POA: Diagnosis not present

## 2015-01-27 DIAGNOSIS — R1033 Periumbilical pain: Secondary | ICD-10-CM | POA: Diagnosis not present

## 2015-01-27 LAB — URINALYSIS, ROUTINE W REFLEX MICROSCOPIC
Bilirubin Urine: NEGATIVE
GLUCOSE, UA: NEGATIVE mg/dL
Hgb urine dipstick: NEGATIVE
Ketones, ur: NEGATIVE mg/dL
LEUKOCYTES UA: NEGATIVE
Nitrite: NEGATIVE
PROTEIN: NEGATIVE mg/dL
Specific Gravity, Urine: 1.007 (ref 1.005–1.030)
UROBILINOGEN UA: 0.2 mg/dL (ref 0.0–1.0)
pH: 6.5 (ref 5.0–8.0)

## 2015-01-27 LAB — COMPREHENSIVE METABOLIC PANEL
ALBUMIN: 4 g/dL (ref 3.5–5.0)
ALT: 21 U/L (ref 14–54)
AST: 28 U/L (ref 15–41)
Alkaline Phosphatase: 57 U/L (ref 38–126)
Anion gap: 7 (ref 5–15)
BUN: 10 mg/dL (ref 6–20)
CHLORIDE: 105 mmol/L (ref 101–111)
CO2: 26 mmol/L (ref 22–32)
CREATININE: 0.76 mg/dL (ref 0.44–1.00)
Calcium: 9.5 mg/dL (ref 8.9–10.3)
GFR calc non Af Amer: >60 mL/min (ref >60)
Glucose, Bld: 97 mg/dL (ref 65–99)
Potassium: 4.1 mmol/L (ref 3.5–5.1)
SODIUM: 138 mmol/L (ref 135–145)
Total Bilirubin: 0.6 mg/dL (ref 0.3–1.2)
Total Protein: 7.6 g/dL (ref 6.5–8.1)

## 2015-01-27 LAB — CBC WITH DIFFERENTIAL/PLATELET
BASOS ABS: 0 10*3/uL (ref 0.0–0.1)
BASOS PCT: 0 % (ref 0–1)
EOS ABS: 0 10*3/uL (ref 0.0–0.7)
EOS PCT: 0 % (ref 0–5)
HCT: 38 % (ref 36.0–46.0)
Hemoglobin: 12.6 g/dL (ref 12.0–15.0)
LYMPHS ABS: 1.1 10*3/uL (ref 0.7–4.0)
Lymphocytes Relative: 21 % (ref 12–46)
MCH: 29.6 pg (ref 26.0–34.0)
MCHC: 33.2 g/dL (ref 30.0–36.0)
MCV: 89.2 fL (ref 78.0–100.0)
Monocytes Absolute: 0.5 10*3/uL (ref 0.1–1.0)
Monocytes Relative: 9 % (ref 3–12)
Neutro Abs: 3.5 10*3/uL (ref 1.7–7.7)
Neutrophils Relative %: 70 % (ref 43–77)
PLATELETS: 390 10*3/uL (ref 150–400)
RBC: 4.26 MIL/uL (ref 3.87–5.11)
RDW: 13.5 % (ref 11.5–15.5)
WBC: 5 10*3/uL (ref 4.0–10.5)

## 2015-01-27 LAB — LIPASE, BLOOD: Lipase: 30 U/L (ref 22–51)

## 2015-01-27 MED ORDER — TRAMADOL HCL 50 MG PO TABS: 50.0000 mg | ORAL_TABLET | Freq: Four times a day (QID) | ORAL | Status: DC | PRN

## 2015-01-27 MED ORDER — MORPHINE SULFATE (PF) 4 MG/ML IV SOLN
4.0000 mg | Freq: Once | INTRAVENOUS | Status: AC
  Administered 2015-01-27: 4 mg via INTRAVENOUS
  Filled 2015-01-27: qty 1

## 2015-01-27 MED ORDER — SODIUM CHLORIDE 0.9 % IV SOLN
1000.0000 mL | INTRAVENOUS | Status: DC
  Administered 2015-01-27: 1000 mL via INTRAVENOUS

## 2015-01-27 MED ORDER — ONDANSETRON HCL 4 MG/2ML IJ SOLN
4.0000 mg | Freq: Once | INTRAMUSCULAR | Status: AC
  Administered 2015-01-27: 4 mg via INTRAVENOUS
  Filled 2015-01-27: qty 2

## 2015-01-27 MED ORDER — SODIUM CHLORIDE 0.9 % IV SOLN
1000.0000 mL | Freq: Once | INTRAVENOUS | Status: AC
  Administered 2015-01-27: 1000 mL via INTRAVENOUS

## 2015-01-27 NOTE — ED Provider Notes (Signed)
CSN: [id number]     Arrival date & time 01/27/15  1323 History   First MD Initiated Contact with Patient 01/27/15 1353     Chief Complaint  Patient presents with  . Abdominal Pain    HPI Pt has had pain in her abdomen for the last three months.  She has been in an out of seeing her doctors.  She was told it was related to her gallbladder.  She had a cholecystectomy on 8/11.  Pt thinks her symptoms are not any better. She continues to have pain in her abdomen and rectum.  She has had some trouble with constipation.  She has tried a laxative.  She did have a bowel movement but her pains are no better.  She has loose stools now. No fevers.  No vomiting.  She has had nausea. Past Medical History  Diagnosis Date  . Hypertension   . Autoimmune disease     positive ANA, double stranded DNA, anti-RO and anti- LA  . Osteoporosis   . Colonic adenoma   . Hypothyroidism   . COPD (chronic obstructive pulmonary disease)     non smoker  . Heart murmur   . Diverticulosis   . Cholelithiasis    Past Surgical History  Procedure Laterality Date  . Cholecystectomy N/A 01/11/2015    Procedure: LAPAROSCOPIC CHOLECYSTECTOMY WITH INTRAOPERATIVE CHOLANGIOGRAM;  Surgeon: Armandina Gemma, MD;  Location: [facility];  Service: General;  Laterality: N/A;   Family History  Problem Relation Age of Onset  . Heart disease Father   . Stomach cancer Brother   . Prostate cancer Brother   . Alzheimer's disease Mother    Social History  Substance Use Topics  . Smoking status: Never Smoker   . Smokeless tobacco: Never Used  . Alcohol Use: 0.0 oz/week    0 Standard drinks or equivalent per week     Comment: Occ. alcohol   OB History    No data available     Review of Systems  All other systems reviewed and are negative.     Allergies  Aspirin; Amoxicillin; and Penicillins  Home Medications   Prior to Admission medications   Medication Sig Start Date End Date Taking? Authorizing Provider  acetaminophen  (TYLENOL) 325 MG tablet Take 2 tablets (650 mg total) by mouth every 6 (six) hours as needed for mild pain (or temp > 100). 01/12/15  Yes Earnstine Regal, PA-C  Cyanocobalamin (VITAMIN B-12) 1000 MCG/15ML LIQD Take 15 mLs by mouth daily.   Yes Historical Provider, MD  lisinopril (PRINIVIL,ZESTRIL) 20 MG tablet Take 20 mg by mouth daily. 10/11/14  Yes Historical Provider, MD  LORazepam (ATIVAN) 0.5 MG tablet Take 0.25 mg by mouth 2 (two) times daily.  12/19/14  Yes Historical Provider, MD  LYRICA 50 MG capsule Take 50 mg by mouth 2 (two) times daily.  10/03/14  Yes Historical Provider, MD  ondansetron (ZOFRAN) 4 MG tablet Take 1 tablet (4 mg total) by mouth every 6 (six) hours. Patient taking differently: Take 4 mg by mouth every 6 (six) hours as needed for nausea or vomiting.  12/29/14  Yes Geronimo Boot, MD  saccharomyces boulardii (FLORASTOR) 250 MG capsule Take 1 capsule (250 mg total) by mouth 2 (two) times daily. Patient taking differently: Take 250-500 mg by mouth 2 (two) times daily.  12/26/14  Yes Amy S Esterwood, PA-C  SYNTHROID 125 MCG tablet Take 125 mcg by mouth daily. 09/22/14  Yes Historical Provider, MD  traMADol (ULTRAM) 50 MG tablet  Take 1 tablet (50 mg total) by mouth every 6 (six) hours as needed. 01/27/15   Dorie Rank, MD   BP 153/76 mmHg  Pulse 95  Temp(Src) 97.8 F (36.6 C) (Oral)  Resp 24  SpO2 100% Physical Exam  Constitutional: She appears well-developed and well-nourished. No distress.  HENT:  Head: Normocephalic and atraumatic.  Right Ear: External ear normal.  Left Ear: External ear normal.  Eyes: Conjunctivae are normal. Right eye exhibits no discharge. Left eye exhibits no discharge. No scleral icterus.  Neck: Neck supple. No tracheal deviation present.  Cardiovascular: Normal rate, regular rhythm and intact distal pulses.   Pulmonary/Chest: Effort normal and breath sounds normal. No stridor. No respiratory distress. She has no wheezes. She has no rales.  Abdominal:  Soft. Bowel sounds are normal. She exhibits no distension. There is tenderness in the right lower quadrant and suprapubic area. There is no rigidity, no rebound and no guarding. No hernia.  Musculoskeletal: She exhibits no edema or tenderness.  Neurological: She is alert. She has normal strength. No cranial nerve deficit (no facial droop, extraocular movements intact, no slurred speech) or sensory deficit. She exhibits normal muscle tone. She displays no seizure activity. Coordination normal.  Skin: Skin is warm and dry. No rash noted.  Psychiatric: She has a normal mood and affect.  Nursing note and vitals reviewed.   ED Course  Procedures (including critical care time) Labs Review Labs Reviewed  CBC WITH DIFFERENTIAL/PLATELET  COMPREHENSIVE METABOLIC PANEL  LIPASE, BLOOD  URINALYSIS, ROUTINE W REFLEX MICROSCOPIC (NOT AT [facility])    Imaging Review Dg Abd Acute W/chest  01/27/2015   CLINICAL DATA:  81 year old female with abdominal pain and nausea today. Intermittent abdominal pain for the past 3 months.  EXAM: DG ABDOMEN ACUTE W/ 1V CHEST  COMPARISON:  CT the abdomen and pelvis 01/18/2015. Chest x-ray 01/07/2015.  FINDINGS: Lung volumes are normal. No consolidative airspace disease. No pleural effusions. No pneumothorax. No pulmonary nodule or mass noted. Pulmonary vasculature and the cardiomediastinal silhouette are within normal limits. Atherosclerosis in the thoracic aorta. Prominent costochondral cartilage calcifications incidentally noted.  Supine and upright views of the abdomen demonstrate gas and stool scattered throughout the colon extending to the level of the distal rectum. No pathologic dilatation of small bowel or colon. Surgical clips project over the right upper quadrant of the abdomen, compatible with prior cholecystectomy. Numerous pelvic phleboliths.  IMPRESSION: 1. Nonobstructive bowel gas pattern. 2. No pneumoperitoneum. 3. No radiographic evidence of acute cardiopulmonary  disease. 4. Atherosclerosis.   Electronically Signed   By: Vinnie Langton M.D.   On: 01/27/2015 15:11   I have personally reviewed and evaluated these images and lab results as part of my medical decision-making.  Medications  0.9 %  sodium chloride infusion (1,000 mLs Intravenous New Bag/Given 01/27/15 1441)    Followed by  0.9 %  sodium chloride infusion (1,000 mLs Intravenous New Bag/Given 01/27/15 1452)  ondansetron (ZOFRAN) injection 4 mg (4 mg Intravenous Given 01/27/15 1441)  morphine 4 MG/ML injection 4 mg (4 mg Intravenous Given 01/27/15 1441)     MDM   Final diagnoses:  Abdominal pain, unspecified abdominal location    Patient has been having trouble with recurrent abdominal pain despite her cholecystectomy..  Patient states her pain had been going on for several months prior to the surgery. It has not gotten much better after the surgery. Patient was seen on August 18 for similar symptoms. She had a CT scan of the abdomen and  pelvis that did not show any acute abnormalities. Unfortunately, the patient's pain continues.  She does not have any evidence of any acute abnormalities based on her laboratory tests. The patient's abdominal x-rays do not show any evidence of obstruction. I do not think she requires another abdominal CT scan today.  Patient was told by her GI doctor that she may need to have a colonoscopy. Patient requested to have that done today.  I explained to her that unfortunately she will need to call a GI doctor to have that range. That is not something that we can do emergently. I explained how she will need a bowel prep prior to that procedure.  At this time there does not appear to be any evidence of an acute emergency medical condition and the patient appears stable for discharge with appropriate outpatient follow up.     Dorie Rank, MD 01/27/15 [phone]

## 2015-01-27 NOTE — Discharge Instructions (Signed)

## 2015-01-27 NOTE — ED Notes (Signed)
Pt brought in for abd pain.  Pt had gallbladder surgery on 01/10/15.  Pt states that she had diarrhea today after taking kaopectate for her constipation yesterday.

## 2015-01-29 ENCOUNTER — Telehealth: Payer: Self-pay | Admitting: Physician Assistant

## 2015-01-29 DIAGNOSIS — F418 Other specified anxiety disorders: Secondary | ICD-10-CM | POA: Diagnosis not present

## 2015-01-29 DIAGNOSIS — R109 Unspecified abdominal pain: Secondary | ICD-10-CM | POA: Diagnosis not present

## 2015-01-29 DIAGNOSIS — Z6822 Body mass index (BMI) 22.0-22.9, adult: Secondary | ICD-10-CM | POA: Diagnosis not present

## 2015-01-29 NOTE — Telephone Encounter (Signed)
Spoke with Colletta [place] and scheduled patient on 01/31/15 at 1:45 PM with Cecille Rubin Hvozdovic, PA-C.

## 2015-01-31 ENCOUNTER — Other Ambulatory Visit (INDEPENDENT_AMBULATORY_CARE_PROVIDER_SITE_OTHER): Payer: Commercial Managed Care - HMO

## 2015-01-31 ENCOUNTER — Ambulatory Visit (INDEPENDENT_AMBULATORY_CARE_PROVIDER_SITE_OTHER): Payer: Commercial Managed Care - HMO | Admitting: Physician Assistant

## 2015-01-31 ENCOUNTER — Emergency Department (HOSPITAL_COMMUNITY): Payer: Commercial Managed Care - HMO

## 2015-01-31 ENCOUNTER — Encounter (HOSPITAL_COMMUNITY): Payer: Self-pay | Admitting: Emergency Medicine

## 2015-01-31 ENCOUNTER — Emergency Department (HOSPITAL_COMMUNITY)
Admission: EM | Admit: 2015-01-31 | Discharge: 2015-01-31 | Disposition: A | Discharge status: Home / Self Care | Payer: Commercial Managed Care - HMO | Attending: Emergency Medicine | Admitting: Emergency Medicine

## 2015-01-31 ENCOUNTER — Encounter: Payer: Self-pay | Admitting: Physician Assistant

## 2015-01-31 VITALS — BP 114/58 | HR 92 | Ht 64.0 in | Wt 128.5 lb

## 2015-01-31 DIAGNOSIS — E039 Hypothyroidism, unspecified: Secondary | ICD-10-CM | POA: Diagnosis not present

## 2015-01-31 DIAGNOSIS — R011 Cardiac murmur, unspecified: Secondary | ICD-10-CM | POA: Diagnosis not present

## 2015-01-31 DIAGNOSIS — Z79899 Other long term (current) drug therapy: Secondary | ICD-10-CM | POA: Diagnosis not present

## 2015-01-31 DIAGNOSIS — Z86018 Personal history of other benign neoplasm: Secondary | ICD-10-CM | POA: Diagnosis not present

## 2015-01-31 DIAGNOSIS — M542 Cervicalgia: Secondary | ICD-10-CM | POA: Diagnosis not present

## 2015-01-31 DIAGNOSIS — R1013 Epigastric pain: Secondary | ICD-10-CM | POA: Diagnosis not present

## 2015-01-31 DIAGNOSIS — R110 Nausea: Secondary | ICD-10-CM | POA: Diagnosis not present

## 2015-01-31 DIAGNOSIS — R51 Headache: Secondary | ICD-10-CM | POA: Diagnosis not present

## 2015-01-31 DIAGNOSIS — R197 Diarrhea, unspecified: Secondary | ICD-10-CM

## 2015-01-31 DIAGNOSIS — R42 Dizziness and giddiness: Secondary | ICD-10-CM | POA: Diagnosis not present

## 2015-01-31 DIAGNOSIS — R531 Weakness: Secondary | ICD-10-CM | POA: Diagnosis not present

## 2015-01-31 DIAGNOSIS — R404 Transient alteration of awareness: Secondary | ICD-10-CM | POA: Diagnosis not present

## 2015-01-31 DIAGNOSIS — K219 Gastro-esophageal reflux disease without esophagitis: Secondary | ICD-10-CM

## 2015-01-31 DIAGNOSIS — J449 Chronic obstructive pulmonary disease, unspecified: Secondary | ICD-10-CM | POA: Diagnosis not present

## 2015-01-31 DIAGNOSIS — Z862 Personal history of diseases of the blood and blood-forming organs and certain disorders involving the immune mechanism: Secondary | ICD-10-CM | POA: Insufficient documentation

## 2015-01-31 DIAGNOSIS — Z8719 Personal history of other diseases of the digestive system: Secondary | ICD-10-CM | POA: Insufficient documentation

## 2015-01-31 DIAGNOSIS — I10 Essential (primary) hypertension: Secondary | ICD-10-CM | POA: Insufficient documentation

## 2015-01-31 DIAGNOSIS — I6522 Occlusion and stenosis of left carotid artery: Secondary | ICD-10-CM | POA: Diagnosis not present

## 2015-01-31 DIAGNOSIS — Z880 Allergy status to penicillin: Secondary | ICD-10-CM | POA: Insufficient documentation

## 2015-01-31 DIAGNOSIS — R252 Cramp and spasm: Secondary | ICD-10-CM | POA: Diagnosis not present

## 2015-01-31 DIAGNOSIS — M6281 Muscle weakness (generalized): Secondary | ICD-10-CM | POA: Diagnosis not present

## 2015-01-31 LAB — CBC WITH DIFFERENTIAL/PLATELET
BASOS ABS: 0 10*3/uL (ref 0.0–0.1)
BASOS ABS: 0 10*3/uL (ref 0.0–0.1)
BASOS PCT: 0.4 % (ref 0.0–3.0)
Basophils Relative: 0 % (ref 0–1)
EOS ABS: 0 10*3/uL (ref 0.0–0.7)
EOS PCT: 0 % (ref 0–5)
Eosinophils Absolute: 0 10*3/uL (ref 0.0–0.7)
Eosinophils Relative: 0.1 % (ref 0.0–5.0)
HCT: 36.3 % (ref 36.0–46.0)
HEMATOCRIT: 35.6 % — AB (ref 36.0–46.0)
HEMOGLOBIN: 11.6 g/dL — AB (ref 12.0–15.0)
Hemoglobin: 12.1 g/dL (ref 12.0–15.0)
LYMPHS ABS: 1.2 10*3/uL (ref 0.7–4.0)
LYMPHS PCT: 24 % (ref 12–46)
Lymphocytes Relative: 17.5 % (ref 12.0–46.0)
Lymphs Abs: 1 10*3/uL (ref 0.7–4.0)
MCH: 28.9 pg (ref 26.0–34.0)
MCHC: 32.6 g/dL (ref 30.0–36.0)
MCHC: 33.4 g/dL (ref 30.0–36.0)
MCV: 88.6 fL (ref 78.0–100.0)
MCV: 87.4 fl (ref 78.0–100.0)
Monocytes Absolute: 0.4 10*3/uL (ref 0.1–1.0)
Monocytes Absolute: 0.6 10*3/uL (ref 0.1–1.0)
Monocytes Relative: 9 % (ref 3–12)
Monocytes Relative: 8.9 % (ref 3.0–12.0)
NEUTROS ABS: 2.7 10*3/uL (ref 1.7–7.7)
NEUTROS ABS: 4.9 10*3/uL (ref 1.4–7.7)
NEUTROS PCT: 67 % (ref 43–77)
NEUTROS PCT: 73.1 % (ref 43.0–77.0)
PLATELETS: 305 10*3/uL (ref 150–400)
PLATELETS: 387 10*3/uL (ref 150.0–400.0)
RBC: 4.02 MIL/uL (ref 3.87–5.11)
RBC: 4.15 Mil/uL (ref 3.87–5.11)
RDW: 13.3 % (ref 11.5–15.5)
RDW: 13.6 % (ref 11.5–15.5)
WBC: 4.1 10*3/uL (ref 4.0–10.5)
WBC: 6.8 10*3/uL (ref 4.0–10.5)

## 2015-01-31 LAB — COMPREHENSIVE METABOLIC PANEL
ALK PHOS: 49 U/L (ref 38–126)
ALT: 15 U/L (ref 14–54)
AST: 22 U/L (ref 15–41)
Albumin: 3.5 g/dL (ref 3.5–5.0)
Anion gap: 9 (ref 5–15)
BUN: 5 mg/dL — AB (ref 6–20)
CHLORIDE: 99 mmol/L — AB (ref 101–111)
CO2: 28 mmol/L (ref 22–32)
CREATININE: 0.7 mg/dL (ref 0.44–1.00)
Calcium: 9 mg/dL (ref 8.9–10.3)
GFR calc Af Amer: >60 mL/min (ref >60)
Glucose, Bld: 110 mg/dL — ABNORMAL HIGH (ref 65–99)
Potassium: 3.9 mmol/L (ref 3.5–5.1)
Sodium: 136 mmol/L (ref 135–145)
Total Bilirubin: 0.6 mg/dL (ref 0.3–1.2)
Total Protein: 6.9 g/dL (ref 6.5–8.1)

## 2015-01-31 LAB — URINALYSIS, ROUTINE W REFLEX MICROSCOPIC
Bilirubin Urine: NEGATIVE
GLUCOSE, UA: NEGATIVE mg/dL
HGB URINE DIPSTICK: NEGATIVE
Ketones, ur: NEGATIVE mg/dL
Nitrite: NEGATIVE
PROTEIN: NEGATIVE mg/dL
SPECIFIC GRAVITY, URINE: 1.01 (ref 1.005–1.030)
Urobilinogen, UA: 0.2 mg/dL (ref 0.0–1.0)
pH: 6 (ref 5.0–8.0)

## 2015-01-31 LAB — LIPASE: Lipase: 25 U/L (ref 11.0–59.0)

## 2015-01-31 LAB — URINE MICROSCOPIC-ADD ON

## 2015-01-31 LAB — HEPATIC FUNCTION PANEL
ALT: 12 U/L (ref 0–35)
AST: 16 U/L (ref 0–37)
Albumin: 4 g/dL (ref 3.5–5.2)
Alkaline Phosphatase: 54 U/L (ref 39–117)
BILIRUBIN TOTAL: 0.4 mg/dL (ref 0.2–1.2)
Bilirubin, Direct: 0.1 mg/dL (ref 0.0–0.3)
Total Protein: 7.3 g/dL (ref 6.0–8.3)

## 2015-01-31 MED ORDER — IOHEXOL 350 MG/ML SOLN
50.0000 mL | Freq: Once | INTRAVENOUS | Status: AC | PRN
  Administered 2015-01-31: 50 mL via INTRAVENOUS

## 2015-01-31 MED ORDER — PANTOPRAZOLE SODIUM 40 MG PO TBEC: DELAYED_RELEASE_TABLET | ORAL | Status: AC

## 2015-01-31 MED ORDER — LORAZEPAM 2 MG/ML IJ SOLN
0.5000 mg | Freq: Once | INTRAMUSCULAR | Status: AC
  Administered 2015-01-31: 0.5 mg via INTRAVENOUS
  Filled 2015-01-31: qty 1

## 2015-01-31 MED ORDER — GI COCKTAIL ~~LOC~~
30.0000 mL | Freq: Once | ORAL | Status: AC
  Administered 2015-01-31: 30 mL via ORAL
  Filled 2015-01-31: qty 30

## 2015-01-31 NOTE — ED Provider Notes (Signed)
CSN: [id number]     Arrival date & time 01/31/15  0601 History   First MD Initiated Contact with Patient 01/31/15 [phone]     Chief Complaint  Patient presents with  . Spasms     (Consider location/radiation/quality/duration/timing/severity/associated sxs/prior Treatment) Patient is a 81 y.o. female presenting with musculoskeletal pain. The history is provided by the patient. No language interpreter was used.  Muscle Pain This is a new problem. The current episode started today. The problem occurs constantly. The problem has been gradually worsening. Associated symptoms include myalgias, neck pain and weakness. Pertinent negatives include no fever or headaches. Nothing aggravates the symptoms. She has tried nothing for the symptoms. The treatment provided moderate relief.  Pt reports she has cramping in her shoulder and stiffness in her neck.    Past Medical History  Diagnosis Date  . Hypertension   . Autoimmune disease     positive ANA, double stranded DNA, anti-RO and anti- LA  . Osteoporosis   . Colonic adenoma   . Hypothyroidism   . COPD (chronic obstructive pulmonary disease)     non smoker  . Heart murmur   . Diverticulosis   . Cholelithiasis    Past Surgical History  Procedure Laterality Date  . Cholecystectomy N/A 01/11/2015    Procedure: LAPAROSCOPIC CHOLECYSTECTOMY WITH INTRAOPERATIVE CHOLANGIOGRAM;  Surgeon: Armandina Gemma, MD;  Location: [facility];  Service: General;  Laterality: N/A;   Family History  Problem Relation Age of Onset  . Heart disease Father   . Stomach cancer Brother   . Prostate cancer Brother   . Alzheimer's disease Mother    Social History  Substance Use Topics  . Smoking status: Never Smoker   . Smokeless tobacco: Never Used  . Alcohol Use: 0.0 oz/week    0 Standard drinks or equivalent per week     Comment: Occ. alcohol   OB History    No data available     Review of Systems  Constitutional: Negative for fever.  Musculoskeletal: Positive  for myalgias and neck pain.  Neurological: Positive for weakness. Negative for headaches.  All other systems reviewed and are negative.     Allergies  Aspirin; Amoxicillin; and Penicillins  Home Medications   Prior to Admission medications   Medication Sig Start Date End Date Taking? Authorizing Provider  acetaminophen (TYLENOL) 325 MG tablet Take 2 tablets (650 mg total) by mouth every 6 (six) hours as needed for mild pain (or temp > 100). 01/12/15   Earnstine Regal, PA-C  Cyanocobalamin (VITAMIN B-12) 1000 MCG/15ML LIQD Take 15 mLs by mouth daily.    Historical Provider, MD  lisinopril (PRINIVIL,ZESTRIL) 20 MG tablet Take 20 mg by mouth daily. 10/11/14   Historical Provider, MD  LORazepam (ATIVAN) 0.5 MG tablet Take 0.25 mg by mouth 2 (two) times daily.  12/19/14   Historical Provider, MD  LYRICA 50 MG capsule Take 50 mg by mouth 2 (two) times daily.  10/03/14   Historical Provider, MD  ondansetron (ZOFRAN) 4 MG tablet Take 1 tablet (4 mg total) by mouth every 6 (six) hours. Patient taking differently: Take 4 mg by mouth every 6 (six) hours as needed for nausea or vomiting.  12/29/14   Geronimo Boot, MD  saccharomyces boulardii (FLORASTOR) 250 MG capsule Take 1 capsule (250 mg total) by mouth 2 (two) times daily. Patient taking differently: Take 250-500 mg by mouth 2 (two) times daily.  12/26/14   Amy S Esterwood, PA-C  SYNTHROID 125 MCG tablet Take 125 mcg  by mouth daily. 09/22/14   Historical Provider, MD  traMADol (ULTRAM) 50 MG tablet Take 1 tablet (50 mg total) by mouth every 6 (six) hours as needed. 01/27/15   Dorie Rank, MD   BP 155/66 mmHg  Pulse 72  Temp(Src) 98.7 F (37.1 C) (Oral)  Resp 16  SpO2 97% Physical Exam  Constitutional: She is oriented to person, place, and time. She appears well-developed and well-nourished.  HENT:  Head: Normocephalic and atraumatic.  Right Ear: External ear normal.  Left Ear: External ear normal.  Nose: Nose normal.  Mouth/Throat: Oropharynx  is clear and moist.  Eyes: Conjunctivae and EOM are normal. Pupils are equal, round, and reactive to light.  Neck: Normal range of motion.  Cardiovascular: Normal rate and normal heart sounds.   Pulmonary/Chest: Effort normal.  Abdominal: Soft. She exhibits no distension.  Musculoskeletal: Normal range of motion.  Neurological: She is alert and oriented to person, place, and time.  Skin: Skin is warm.  Psychiatric: She has a normal mood and affect.  Nursing note and vitals reviewed.   ED Course  Procedures (including critical care time) Labs Review Labs Reviewed  CBC WITH DIFFERENTIAL/PLATELET  COMPREHENSIVE METABOLIC PANEL  URINALYSIS, ROUTINE W REFLEX MICROSCOPIC (NOT AT [facility])    Imaging Review Ct Angio Head W/cm &/or Wo Cm  01/31/2015   CLINICAL DATA:  Headache and dizziness.  Bilateral arm weakness.  EXAM: CT ANGIOGRAPHY HEAD AND NECK  TECHNIQUE: Multidetector CT imaging of the head and neck was performed using the standard protocol during bolus administration of intravenous contrast. Multiplanar CT image reconstructions and MIPs were obtained to evaluate the vascular anatomy. Carotid stenosis measurements (when applicable) are obtained utilizing NASCET criteria, using the distal internal carotid diameter as the denominator.  CONTRAST:  40mL OMNIPAQUE IOHEXOL 350 MG/ML SOLN  COMPARISON:  MRI head 08/20/2010  FINDINGS: CT HEAD  Brain: Ventricle size is normal. Age-appropriate mild atrophy. Negative for acute infarct. Negative for hemorrhage or mass.  Calvarium and skull base: Negative  Paranasal sinuses: Mild mucosal edema in the sphenoid sinus bilaterally. Remaining sinuses are clear.  Orbits: Negative  CTA NECK  Aortic arch: Mild atherosclerotic disease in the aortic arch. Proximal great vessels have atherosclerotic disease without significant stenosis.  Apical scarring bilaterally.  No apical lung mass.  Right carotid system: Right common carotid artery widely patent. Mild  atherosclerotic calcification in the carotid bulb without significant stenosis. No dissection.  Left carotid system: Left common carotid artery widely patent. Atherosclerotic calcification the carotid bifurcation. 30% diameter stenosis proximal left internal carotid artery. Left external carotid artery widely patent.  Vertebral arteries:Both vertebral arteries widely patent to the basilar without stenosis.  Skeleton: Cervical disc degeneration and spondylosis. Facet degeneration. 2 mm anterior slip C3-4 due to right-sided facet degeneration. No acute skeletal abnormality.  Other neck: Negative for mass or adenopathy in the neck.  CTA HEAD  Anterior circulation: Mild atherosclerotic calcification and plaque in the cavernous carotid bilaterally without significant stenosis. Anterior and middle cerebral arteries patent bilaterally without significant stenosis.  Posterior circulation: Both vertebral arteries patent to the basilar without stenosis. PICA patent bilaterally. AICA, superior cerebellar, and posterior cerebral arteries patent bilaterally without significant stenosis. Fetal origin of the left posterior cerebral artery. Patent right posterior communicating artery.  Venous sinuses: Patent  Anatomic variants: None.  Negative for cerebral aneurysm.  Delayed phase: Normal enhancement on delayed imaging  IMPRESSION: No acute intracranial abnormality  Mild atherosclerotic disease in the carotid bifurcation bilaterally left greater than right.  30% diameter stenosis left internal carotid artery. No significant vertebral artery stenosis.  No significant intracranial stenosis. Negative for cerebral aneurysm.   Electronically Signed   By: Franchot Gallo M.D.   On: 01/31/2015 10:12   Ct Angio Neck W/cm &/or Wo/cm  01/31/2015   CLINICAL DATA:  Headache and dizziness.  Bilateral arm weakness.  EXAM: CT ANGIOGRAPHY HEAD AND NECK  TECHNIQUE: Multidetector CT imaging of the head and neck was performed using the standard  protocol during bolus administration of intravenous contrast. Multiplanar CT image reconstructions and MIPs were obtained to evaluate the vascular anatomy. Carotid stenosis measurements (when applicable) are obtained utilizing NASCET criteria, using the distal internal carotid diameter as the denominator.  CONTRAST:  59mL OMNIPAQUE IOHEXOL 350 MG/ML SOLN  COMPARISON:  MRI head 08/20/2010  FINDINGS: CT HEAD  Brain: Ventricle size is normal. Age-appropriate mild atrophy. Negative for acute infarct. Negative for hemorrhage or mass.  Calvarium and skull base: Negative  Paranasal sinuses: Mild mucosal edema in the sphenoid sinus bilaterally. Remaining sinuses are clear.  Orbits: Negative  CTA NECK  Aortic arch: Mild atherosclerotic disease in the aortic arch. Proximal great vessels have atherosclerotic disease without significant stenosis.  Apical scarring bilaterally.  No apical lung mass.  Right carotid system: Right common carotid artery widely patent. Mild atherosclerotic calcification in the carotid bulb without significant stenosis. No dissection.  Left carotid system: Left common carotid artery widely patent. Atherosclerotic calcification the carotid bifurcation. 30% diameter stenosis proximal left internal carotid artery. Left external carotid artery widely patent.  Vertebral arteries:Both vertebral arteries widely patent to the basilar without stenosis.  Skeleton: Cervical disc degeneration and spondylosis. Facet degeneration. 2 mm anterior slip C3-4 due to right-sided facet degeneration. No acute skeletal abnormality.  Other neck: Negative for mass or adenopathy in the neck.  CTA HEAD  Anterior circulation: Mild atherosclerotic calcification and plaque in the cavernous carotid bilaterally without significant stenosis. Anterior and middle cerebral arteries patent bilaterally without significant stenosis.  Posterior circulation: Both vertebral arteries patent to the basilar without stenosis. PICA patent  bilaterally. AICA, superior cerebellar, and posterior cerebral arteries patent bilaterally without significant stenosis. Fetal origin of the left posterior cerebral artery. Patent right posterior communicating artery.  Venous sinuses: Patent  Anatomic variants: None.  Negative for cerebral aneurysm.  Delayed phase: Normal enhancement on delayed imaging  IMPRESSION: No acute intracranial abnormality  Mild atherosclerotic disease in the carotid bifurcation bilaterally left greater than right. 30% diameter stenosis left internal carotid artery. No significant vertebral artery stenosis.  No significant intracranial stenosis. Negative for cerebral aneurysm.   Electronically Signed   By: Franchot Gallo M.D.   On: 01/31/2015 10:12   I have personally reviewed and evaluated these images and lab results as part of my medical decision-making.   EKG Interpretation   Date/Time:  Wednesday January 31 2015 08:18:00 EDT Ventricular Rate:  69 PR Interval:    QRS Duration: 73 QT Interval:  397 QTC Calculation: 425 R Axis:   75 Text Interpretation:  Normal sinus rhythm No significant change since last  tracing Confirmed by Mingo Amber  MD, [place] ([id number]) on 01/31/2015 8:32:55 AM      MDM   Final diagnoses:  Neck pain    Pt feels better after ativan.   I counseled on neck ct.  Pt advised to follow up with her [place], [place] 01/31/15 1120  Merryl Hacker, MD 02/06/15 [phone]

## 2015-01-31 NOTE — ED Notes (Signed)
Patient transported to CT 

## 2015-01-31 NOTE — ED Notes (Signed)
Magda Paganini, PA at bedside at this time.

## 2015-01-31 NOTE — Progress Notes (Signed)
Agree with initial assessment and plans. Etiology of symptoms not clear

## 2015-01-31 NOTE — ED Notes (Signed)
Patient arrived via [facility]. EMS reports: patient began having muscle cramps/spasms in neck, arms, legs yesterday, along with nausea. Denied vomiting. Also, reported episode of "cold sweats" x 1 yesterday. Severe right sided neck cramp resumed this morning when she awoke approx 0430. While EMS present, reports episode of patient "staring into space" and "seemed confused." Episode lasted approx 30 seconds. VSS. BP - 154/78, Pulse 70, CBG 93. NSR on monitor. 20 gauge in L hand.

## 2015-01-31 NOTE — Patient Instructions (Signed)
You have been scheduled for an endoscopy. Please follow written instructions given to you at your visit today. If you use inhalers (even only as needed), please bring them with you on the day of your procedure. Your physician has requested that you go to www.startemmi.com and enter the access code given to you at your visit today. This web site gives a general overview about your procedure. However, you should still follow specific instructions given to you by our office regarding your preparation for the procedure.  Your physician has requested that you go to the basement for lab work before leaving today.  We have sent the following medications to your pharmacy for you to pick up at your convenience: Pantoprazole 40 mg every morning 30 mins before breakfast.

## 2015-01-31 NOTE — Discharge Instructions (Signed)
Degenerative Disk Disease Degenerative disk disease is a condition caused by the changes that occur in the cushions of the backbone (spinal disks) as you grow older. Spinal disks are soft and compressible disks located between the bones of the spine (vertebrae). They act like shock absorbers. Degenerative disk disease can affect the whole spine. However, the neck and lower back are most commonly affected. Many changes can occur in the spinal disks with aging, such as:  The spinal disks may dry and shrink.  Small tears may occur in the tough, outer covering of the disk (annulus).  The disk space may become smaller due to loss of water.  Abnormal growths in the bone (spurs) may occur. This can put pressure on the nerve roots exiting the spinal canal, causing pain.  The spinal canal may become narrowed. CAUSES  Degenerative disk disease is a condition caused by the changes that occur in the spinal disks with aging. The exact cause is not known, but there is a genetic basis for many patients. Degenerative changes can occur due to loss of fluid in the disk. This makes the disk thinner and reduces the space between the backbones. Small cracks can develop in the outer layer of the disk. This can lead to the breakdown of the disk. You are more likely to get degenerative disk disease if you are overweight. Smoking cigarettes and doing heavy work such as weightlifting can also increase your risk of this condition. Degenerative changes can start after a sudden injury. Growth of bone spurs can compress the nerve roots and cause pain.  SYMPTOMS  The symptoms vary from person to person. Some people may have no pain, while others have severe pain. The pain may be so severe that it can limit your activities. The location of the pain depends on the part of your backbone that is affected. You will have neck or arm pain if a disk in the neck area is affected. You will have pain in your back, buttocks, or legs if a disk  in the lower back is affected. The pain becomes worse while bending, reaching up, or with twisting movements. The pain may start gradually and then get worse as time passes. It may also start after a major or minor injury. You may feel numbness or tingling in the arms or legs.  DIAGNOSIS  Your caregiver will ask you about your symptoms and about activities or habits that may cause the pain. He or she may also ask about any injuries, diseases, or treatments you have had earlier. Your caregiver will examine you to check for the range of movement that is possible in the affected area, to check for strength in your extremities, and to check for sensation in the areas of the arms and legs supplied by different nerve roots. An X-ray of the spine may be taken. Your caregiver may suggest other imaging tests, such as magnetic resonance imaging (MRI), if needed.  TREATMENT  Treatment includes rest, modifying your activities, and applying ice and heat. Your caregiver may prescribe medicines to reduce your pain and may ask you to do some exercises to strengthen your back. In some cases, you may need surgery. You and your caregiver will decide on the treatment that is best for you. HOME CARE INSTRUCTIONS   Follow proper lifting and walking techniques as advised by your caregiver.  Maintain good posture.  Exercise regularly as advised.  Perform relaxation exercises.  Change your sitting, standing, and sleeping habits as advised. Change positions  frequently. °· Lose weight as advised. °· Stop smoking if you smoke. °· Wear supportive footwear. °SEEK MEDICAL CARE IF:  °Your pain does not go away within 1 to 4 weeks. °SEEK IMMEDIATE MEDICAL CARE IF:  °· Your pain is severe. °· You notice weakness in your arms, hands, or legs. °· You begin to lose control of your bladder or bowel movements. °MAKE SURE YOU:  °· Understand these instructions. °· Will watch your condition. °· Will get help right away if you are not doing  well or get worse. °Document Released: 03/16/2007 Document Revised: 08/11/2011 Document Reviewed: 09/20/2013 °[employer]® Patient Information ©2015 [employer]. This information is not intended to replace advice given to you by your health care provider. Make sure you discuss any questions you have with your health care provider. °Torticollis, Acute °You have suddenly (acutely) developed a twisted neck (torticollis). This is usually a self-limited condition. °CAUSES  °Acute torticollis may be caused by malposition, trauma or infection. Most commonly, acute torticollis is caused by sleeping in an awkward position. Torticollis may also be caused by the flexion, extension or twisting of the neck muscles beyond their normal position. Sometimes, the exact cause may not be known. °SYMPTOMS  °Usually, there is pain and limited movement of the neck. Your neck may twist to one side. °DIAGNOSIS  °The diagnosis is often made by physical examination. X-rays, CT scans or MRIs may be done if there is a history of trauma or concern of infection. °TREATMENT  °For a common, stiff neck that develops during sleep, treatment is focused on relaxing the contracted neck muscle. Medications (including shots) may be used to treat the problem. Most cases resolve in several days. Torticollis usually responds to conservative physical therapy. If left untreated, the shortened and spastic neck muscle can cause deformities in the face and neck. Rarely, surgery is required. °HOME CARE INSTRUCTIONS  °· Use over-the-counter and prescription medications as directed by your caregiver. °· Do stretching exercises and massage the neck as directed by your caregiver. °· Follow up with physical therapy if needed and as directed by your caregiver. °SEEK IMMEDIATE MEDICAL CARE IF:  °· You develop difficulty breathing or noisy breathing (stridor). °· You drool, develop trouble swallowing or have pain with swallowing. °· You develop numbness or weakness in the  hands or feet. °· You have changes in speech or vision. °· You have problems with urination or bowel movements. °· You have difficulty walking. °· You have a fever. °· You have increased pain. °MAKE SURE YOU:  °· Understand these instructions. °· Will watch your condition. °· Will get help right away if you are not doing well or get worse. °Document Released: 05/16/2000 Document Revised: 08/11/2011 Document Reviewed: 06/27/2009 °[employer]® Patient Information ©2015 [employer]. This information is not intended to replace advice given to you by your health care provider. Make sure you discuss any questions you have with your health care provider. ° °

## 2015-01-31 NOTE — ED Notes (Signed)
Patient reports that she began having muscle spasms yesterday intermittently. Denies at this time. Also, reports she experienced nausea, but no vomiting, along with episode of cold sweats x 1. Spouse reports patient confused briefly during EMS visit, and "not herself." Denies pain at this time. Neuro intact.

## 2015-01-31 NOTE — Progress Notes (Signed)
Patient ID: Sarah Manning, female   DOB: 01/08/1925, 81 y.o.   MRN: [id number]     History of Present Illness: Sarah Manning is an 81 year old female known to Dr. Henrene Pastor. She was seen in July for diarrhea and abdominal pain of 6 weeks' duration. Stool studies were obtained and were nonrevealing. She was seen in the emergency room in June with abdominal pain and had a CT of the abdomen and pelvis done that revealed gallstones and a 5 mm cyst in the body of the pancreas, colonic diverticulosis, and was an otherwise negative exam. When evaluated in July she was given an empiric course of Flagyl 250 mg 4 times daily for possible bacterial overgrowth. She took it for several days and was unable to tolerate it so she discontinued it. She was seen in the emergency room on July 28 and laboratory studies revealed no leukocytosis or anemia. UA had no signs of infection or blood. She was given Zofran with relief. She was last seen in our office on August 2 it which time she states she felt better and had no diarrhea and was having small formed bowel movements. She had not vomited and was having less nausea but reported that she sometimes felt full after a few bites and was belching frequently. CT scan was reviewed with Dr. Aleene Davidson of radiology who reported some atherosclerosis but mesenteric vascular supply was patent with no SMV occlusion. At that visit she was given a trial of ranitidine 150 mg at bedtime and she was advised to follow-up in 2 weeks. Unfortunately on August 7 she had a visit to the emergency room with complaints of chills. Laboratory studies were nonrevealing and she felt better in the emergency room and was discharged home. She was seen again in the emergency room on August 10 with epigastric and right upper quadrant pain. She was admitted and had a laparoscopic cholecystectomy. She spent 2 nights in the hospital and was discharged home. On August 18 she was again in the emergency room with complaints  of abdominal pain and diarrhea. Laboratory studies were nonrevealing and CT of the abdomen and pelvis had no acute findings. She was again seen in the emergency room on August 27 with abdominal pain and was complaining that her gallbladder surgery did not help her epigastric pain. She was advised to follow-up with GI. She was seen in the emergency room again today with neck and shoulder pain that started when she got out of bed at 12:30 this morning to go to the bathroom. CT of the head and neck had no acute findings. Her discomfort resolved with administration of Ativan.  She is here this afternoon very upset because she is having days of formed stools alternating with days of loose stools. She often feels bloated after meals, and is unable to identify specific foods that exacerbate her symptoms. On the days that she has diarrhea she uses Kaopectate and then cannot have a bowel movement the next day she reports that her stools look very oily. She reports that prior to her gallbladder surgery she had been on pantoprazole 40 mg each morning but while in the hospital she reports she was advised to stop taking it. She is now having epigastric pain worse on an empty stomach, temporarily alleviated with ingestion of food and then it comes back. Her appetite has been decreased, she states she is unable to taste her food, and she feels full sooner than normal. She reports that she has lost 20-22  pounds over the past 3 months. Constant burning sensation in her esophagus. She denies dysphagia.   Past Medical History  Diagnosis Date  . Hypertension   . Autoimmune disease     positive ANA, double stranded DNA, anti-RO and anti- LA  . Osteoporosis   . Colonic adenoma   . Hypothyroidism   . COPD (chronic obstructive pulmonary disease)     non smoker  . Heart murmur   . Diverticulosis   . Cholelithiasis     Past Surgical History  Procedure Laterality Date  . Cholecystectomy N/A 01/11/2015    Procedure:  LAPAROSCOPIC CHOLECYSTECTOMY WITH INTRAOPERATIVE CHOLANGIOGRAM;  Surgeon: Armandina Gemma, MD;  Location: [facility];  Service: General;  Laterality: N/A;   Family History  Problem Relation Age of Onset  . Heart disease Father   . Stomach cancer Brother   . Prostate cancer Brother   . Alzheimer's disease Mother    Social History  Substance Use Topics  . Smoking status: Never Smoker   . Smokeless tobacco: Never Used  . Alcohol Use: 0.0 oz/week    0 Standard drinks or equivalent per week     Comment: Occ. alcohol   Current Outpatient Prescriptions  Medication Sig Dispense Refill  . acetaminophen (TYLENOL) 325 MG tablet Take 2 tablets (650 mg total) by mouth every 6 (six) hours as needed for mild pain (or temp > 100).    . Cyanocobalamin (VITAMIN B-12) 1000 MCG/15ML LIQD Take 15 mLs by mouth daily.    Marland Kitchen dicyclomine (BENTYL) 20 MG tablet Take 1 tablet by mouth 4 (four) times daily.    Marland Kitchen escitalopram (LEXAPRO) 10 MG tablet Take 0.5 tablets by mouth 2 (two) times daily.    Marland Kitchen lisinopril (PRINIVIL,ZESTRIL) 20 MG tablet Take 20 mg by mouth daily.    Marland Kitchen LORazepam (ATIVAN) 0.5 MG tablet Take 0.25 mg by mouth 2 (two) times daily.     Marland Kitchen LYRICA 50 MG capsule Take 50 mg by mouth 2 (two) times daily.     . ondansetron (ZOFRAN) 4 MG tablet Take 1 tablet (4 mg total) by mouth every 6 (six) hours. (Patient taking differently: Take 4 mg by mouth every 6 (six) hours as needed for nausea or vomiting. ) 4 tablet 0  . pantoprazole (PROTONIX) 40 MG tablet One tablet orally qam 30 mins before meals 30 tablet 1  . saccharomyces boulardii (FLORASTOR) 250 MG capsule Take 1 capsule (250 mg total) by mouth 2 (two) times daily. (Patient taking differently: Take 250-500 mg by mouth 2 (two) times daily. ) 60 capsule 1  . SYNTHROID 125 MCG tablet Take 125 mcg by mouth daily.    . [DISCONTINUED] hyoscyamine (LEVSIN SL) 0.125 MG SL tablet Take 1 tablet (0.125 mg total) by mouth every 6 (six) hours as needed. (Patient not taking:  Reported on 01/27/2015) 30 tablet 3  . [DISCONTINUED] ranitidine (ZANTAC) 150 MG tablet Take 1 tablet (150 mg total) by mouth at bedtime. (Patient not taking: Reported on 01/10/2015) 30 tablet 3   No current facility-administered medications for this visit.   Allergies  Allergen Reactions  . Aspirin Other (See Comments)    Excessive blood thinning  . Amoxicillin Itching and Rash  . Penicillins Itching and Rash     Review of Systems: Per history of present illness, otherwise negative.  LAB RESULTS:  Recent Labs  01/31/15 0725  WBC 4.1  HGB 11.6*  HCT 35.6*  PLT 305   BMET  Recent Labs  01/31/15 0725  NA  136  K 3.9  CL 99*  CO2 28  GLUCOSE 110*  BUN 5*  CREATININE 0.70  CALCIUM 9.0   LFT  Recent Labs  01/31/15 0725  PROT 6.9  ALBUMIN 3.5  AST 22  ALT 15  ALKPHOS 49  BILITOT 0.6     Studies:   Ct Angio Head W/cm &/or Wo Cm  01/31/2015   CLINICAL DATA:  Headache and dizziness.  Bilateral arm weakness.  EXAM: CT ANGIOGRAPHY HEAD AND NECK  TECHNIQUE: Multidetector CT imaging of the head and neck was performed using the standard protocol during bolus administration of intravenous contrast. Multiplanar CT image reconstructions and MIPs were obtained to evaluate the vascular anatomy. Carotid stenosis measurements (when applicable) are obtained utilizing NASCET criteria, using the distal internal carotid diameter as the denominator.  CONTRAST:  5mL OMNIPAQUE IOHEXOL 350 MG/ML SOLN  COMPARISON:  MRI head 08/20/2010  FINDINGS: CT HEAD  Brain: Ventricle size is normal. Age-appropriate mild atrophy. Negative for acute infarct. Negative for hemorrhage or mass.  Calvarium and skull base: Negative  Paranasal sinuses: Mild mucosal edema in the sphenoid sinus bilaterally. Remaining sinuses are clear.  Orbits: Negative  CTA NECK  Aortic arch: Mild atherosclerotic disease in the aortic arch. Proximal great vessels have atherosclerotic disease without significant stenosis.  Apical  scarring bilaterally.  No apical lung mass.  Right carotid system: Right common carotid artery widely patent. Mild atherosclerotic calcification in the carotid bulb without significant stenosis. No dissection.  Left carotid system: Left common carotid artery widely patent. Atherosclerotic calcification the carotid bifurcation. 30% diameter stenosis proximal left internal carotid artery. Left external carotid artery widely patent.  Vertebral arteries:Both vertebral arteries widely patent to the basilar without stenosis.  Skeleton: Cervical disc degeneration and spondylosis. Facet degeneration. 2 mm anterior slip C3-4 due to right-sided facet degeneration. No acute skeletal abnormality.  Other neck: Negative for mass or adenopathy in the neck.  CTA HEAD  Anterior circulation: Mild atherosclerotic calcification and plaque in the cavernous carotid bilaterally without significant stenosis. Anterior and middle cerebral arteries patent bilaterally without significant stenosis.  Posterior circulation: Both vertebral arteries patent to the basilar without stenosis. PICA patent bilaterally. AICA, superior cerebellar, and posterior cerebral arteries patent bilaterally without significant stenosis. Fetal origin of the left posterior cerebral artery. Patent right posterior communicating artery.  Venous sinuses: Patent  Anatomic variants: None.  Negative for cerebral aneurysm.  Delayed phase: Normal enhancement on delayed imaging  IMPRESSION: No acute intracranial abnormality  Mild atherosclerotic disease in the carotid bifurcation bilaterally left greater than right. 30% diameter stenosis left internal carotid artery. No significant vertebral artery stenosis.  No significant intracranial stenosis. Negative for cerebral aneurysm.   Electronically Signed   By: Franchot Gallo M.D.   On: 01/31/2015 10:12   Dg Chest 2 View  01/07/2015   CLINICAL DATA:  81 year old female with a history of nausea and chills.  EXAM: CHEST - 2 VIEW   COMPARISON:  Plain film 08/20/2005, CT 08/26/2005, CT abdomen 12/28/2014  FINDINGS: Cardiomediastinal silhouette unchanged in size and contour. Atherosclerotic calcification of the aortic arch.  No evidence of pulmonary vascular congestion.  No confluent airspace disease. No pneumothorax or pleural effusion. Chronic interstitial opacities.  Calcifications of the abdominal aorta.  No displaced fracture.  IMPRESSION: No radiographic evidence of acute cardiopulmonary disease.  Atherosclerotic calcifications.  Signed,  Dulcy Fanny. Earleen Newport, DO  [facility]  [facility]   Electronically Signed   By: Corrie Mckusick D.O.  On: 01/07/2015 09:15   Dg Cholangiogram Operative  01/11/2015   CLINICAL DATA:  Cholecystectomy for cholelithiasis.  EXAM: INTRAOPERATIVE CHOLANGIOGRAM  TECHNIQUE: Cholangiographic images from the C-arm fluoroscopic device were submitted for interpretation post-operatively. Please see the procedural report for the amount of contrast and the fluoroscopy time utilized.  COMPARISON:  Abdominal ultrasound on 01/10/2015  FINDINGS: Intraoperative imaging with a C-arm shows a normal opacified common bile duct with contrast entering the duodenum normally. Visualized intrahepatic ducts are unremarkable. No contrast extravasation identified  IMPRESSION: Normal intraoperative cholangiogram.   Electronically Signed   By: Aletta Edouard M.D.   On: 01/11/2015 08:54   Ct Angio Neck W/cm &/or Wo/cm  01/31/2015   CLINICAL DATA:  Headache and dizziness.  Bilateral arm weakness.  EXAM: CT ANGIOGRAPHY HEAD AND NECK  TECHNIQUE: Multidetector CT imaging of the head and neck was performed using the standard protocol during bolus administration of intravenous contrast. Multiplanar CT image reconstructions and MIPs were obtained to evaluate the vascular anatomy. Carotid stenosis measurements (when applicable) are obtained utilizing NASCET criteria, using the distal internal carotid  diameter as the denominator.  CONTRAST:  80mL OMNIPAQUE IOHEXOL 350 MG/ML SOLN  COMPARISON:  MRI head 08/20/2010  FINDINGS: CT HEAD  Brain: Ventricle size is normal. Age-appropriate mild atrophy. Negative for acute infarct. Negative for hemorrhage or mass.  Calvarium and skull base: Negative  Paranasal sinuses: Mild mucosal edema in the sphenoid sinus bilaterally. Remaining sinuses are clear.  Orbits: Negative  CTA NECK  Aortic arch: Mild atherosclerotic disease in the aortic arch. Proximal great vessels have atherosclerotic disease without significant stenosis.  Apical scarring bilaterally.  No apical lung mass.  Right carotid system: Right common carotid artery widely patent. Mild atherosclerotic calcification in the carotid bulb without significant stenosis. No dissection.  Left carotid system: Left common carotid artery widely patent. Atherosclerotic calcification the carotid bifurcation. 30% diameter stenosis proximal left internal carotid artery. Left external carotid artery widely patent.  Vertebral arteries:Both vertebral arteries widely patent to the basilar without stenosis.  Skeleton: Cervical disc degeneration and spondylosis. Facet degeneration. 2 mm anterior slip C3-4 due to right-sided facet degeneration. No acute skeletal abnormality.  Other neck: Negative for mass or adenopathy in the neck.  CTA HEAD  Anterior circulation: Mild atherosclerotic calcification and plaque in the cavernous carotid bilaterally without significant stenosis. Anterior and middle cerebral arteries patent bilaterally without significant stenosis.  Posterior circulation: Both vertebral arteries patent to the basilar without stenosis. PICA patent bilaterally. AICA, superior cerebellar, and posterior cerebral arteries patent bilaterally without significant stenosis. Fetal origin of the left posterior cerebral artery. Patent right posterior communicating artery.  Venous sinuses: Patent  Anatomic variants: None.  Negative for  cerebral aneurysm.  Delayed phase: Normal enhancement on delayed imaging  IMPRESSION: No acute intracranial abnormality  Mild atherosclerotic disease in the carotid bifurcation bilaterally left greater than right. 30% diameter stenosis left internal carotid artery. No significant vertebral artery stenosis.  No significant intracranial stenosis. Negative for cerebral aneurysm.   Electronically Signed   By: Franchot Gallo M.D.   On: 01/31/2015 10:12   Ct Abdomen Pelvis W Contrast  01/18/2015   CLINICAL DATA:  Acute onset of upper abdominal pain. Dizziness and diarrhea. Initial encounter.  EXAM: CT ABDOMEN AND PELVIS WITH CONTRAST  TECHNIQUE: Multidetector CT imaging of the abdomen and pelvis was performed using the standard protocol following bolus administration of intravenous contrast.  CONTRAST:  174mL OMNIPAQUE IOHEXOL 300 MG/ML  SOLN  COMPARISON:  CT of the abdomen  and pelvis from 12/28/2014, and right upper quadrant ultrasound performed 01/10/2015  FINDINGS: The visualized lung bases are clear.  The liver is grossly unremarkable in appearance. A few tiny hypodensities within the spleen are nonspecific and too small to further characterize. The patient is status post cholecystectomy, with clips noted at the gallbladder fossa. The pancreas and adrenal glands are unremarkable.  Mild bilateral renal scarring is noted. There is no evidence of hydronephrosis. No renal or ureteral stones are seen. No perinephric stranding is appreciated.  No free fluid is identified. The small bowel is unremarkable in appearance. The stomach is within normal limits. No acute vascular abnormalities are seen. Diffuse calcification is seen along the abdominal aorta and its branches.  The appendix is not definitely seen; there is no evidence for appendicitis. Scattered diverticulosis is noted along the descending and proximal sigmoid colon, without evidence of diverticulitis.  The bladder is mildly distended and grossly unremarkable.  The uterus is unremarkable in appearance. The ovaries are grossly symmetric. No suspicious adnexal masses are seen. No inguinal lymphadenopathy is seen.  No acute osseous abnormalities are identified. There is mild chronic deformity involving the left superior and inferior pubic rami.  IMPRESSION: 1. No acute abnormality seen to explain the patient's symptoms. 2. Diffuse calcification along the abdominal aorta and its branches. 3. Few tiny hypodensities within the spleen are too small to further characterize. 4. Scattered diverticulosis along the descending and proximal sigmoid colon, without evidence of diverticulitis.   Electronically Signed   By: Garald Balding M.D.   On: 01/18/2015 20:51   Dg Abd Acute W/chest  01/27/2015   CLINICAL DATA:  81 year old female with abdominal pain and nausea today. Intermittent abdominal pain for the past 3 months.  EXAM: DG ABDOMEN ACUTE W/ 1V CHEST  COMPARISON:  CT the abdomen and pelvis 01/18/2015. Chest x-ray 01/07/2015.  FINDINGS: Lung volumes are normal. No consolidative airspace disease. No pleural effusions. No pneumothorax. No pulmonary nodule or mass noted. Pulmonary vasculature and the cardiomediastinal silhouette are within normal limits. Atherosclerosis in the thoracic aorta. Prominent costochondral cartilage calcifications incidentally noted.  Supine and upright views of the abdomen demonstrate gas and stool scattered throughout the colon extending to the level of the distal rectum. No pathologic dilatation of small bowel or colon. Surgical clips project over the right upper quadrant of the abdomen, compatible with prior cholecystectomy. Numerous pelvic phleboliths.  IMPRESSION: 1. Nonobstructive bowel gas pattern. 2. No pneumoperitoneum. 3. No radiographic evidence of acute cardiopulmonary disease. 4. Atherosclerosis.   Electronically Signed   By: Vinnie Langton M.D.   On: 01/27/2015 15:11   US Abdomen Limited Ruq  01/10/2015   CLINICAL DATA:  Right upper  quadrant abdominal pain for 2 months.  EXAM: US ABDOMEN LIMITED - RIGHT UPPER QUADRANT  COMPARISON:  CT scan of December 28, 2014.  FINDINGS: Gallbladder:  Multiple gallstones are noted with the largest measuring 7 mm. No gallbladder wall thickening or pericholecystic fluid is noted. Sonographic Murphy's sign could not be assessed as the patient has received pain medication.  Common bile duct:  Diameter: 6.9 mm which is mildly dilated. Correlation with liver function tests is recommended.  Liver:  No focal lesion identified. Within normal limits in parenchymal echogenicity. No intrahepatic biliary dilatation is noted.  IMPRESSION: Cholelithiasis is noted without evidence of cholecystitis. Minimal dilatation of distal common bile duct is noted without intrahepatic biliary dilatation ; correlation with liver function tests is recommended to rule out obstruction.   Electronically Signed  By: Marijo Conception, M.D.   On: 01/10/2015 11:42     Physical Exam: BP 114/58 mmHg  Pulse 92  Ht 5\' 4"  (1.626 m)  Wt 128 lb 8 oz (58.287 kg)  BMI 22.05 kg/m2 General: Pleasant, well developed ,female in no acute distress Head: Normocephalic and atraumatic Eyes:  sclerae anicteric, conjunctiva pink  Ears: Normal auditory acuity Lungs: Clear throughout to auscultation Heart: Regular rate and rhythm Abdomen: Soft, non distended, mild epigastric tenderness to palpation with no rebound or guarding, well healed laparoscopic portals,. No masses, no hepatomegaly. Normal bowel sounds Musculoskeletal: Symmetrical with no gross deformities  Extremities: No edema  Neurological: Alert oriented x 4, grossly nonfocal Psychological:  Alert and cooperative. Normal mood and affect  Assessment and Recommendations: 81 year old female with a 2 to three-month history of abdominal pain, here for follow-up. Her main complaint today is also around her epigastric pain and nausea that are worse on an empty stomach, as well as her early  satiety and esophageal burning. An antireflux regimen has been reviewed, and she will restart pantoprazole 40 mg by mouth every morning 30 minutes prior to breakfast. She will be scheduled for an EGD to evaluate for esophagitis, gastritis, ulcer, etc.The risks, benefits, and alternatives to endoscopy with possible biopsy and possible dilation were discussed with the patient and they consent to proceed. With regards to her irregular bowel movements, this is likely functional in nature. She's been encouraged to try to add a fiber supplement such as Benefiber to her diet. She is to decrease her use of Kaopectate and laxity is so she can see what her body can do on its own. A stool culture, stool for ova and parasites, stool for C. difficile, and pancreatic fecal elastase will be obtained along with a CBC, hepatic function panel and lipase. Further recommendations will be made pending the findings of the above.        Collen Vincent, Deloris Ping 01/31/2015,

## 2015-02-03 DIAGNOSIS — R079 Chest pain, unspecified: Secondary | ICD-10-CM | POA: Diagnosis not present

## 2015-02-06 DIAGNOSIS — I10 Essential (primary) hypertension: Secondary | ICD-10-CM | POA: Diagnosis not present

## 2015-02-06 DIAGNOSIS — F418 Other specified anxiety disorders: Secondary | ICD-10-CM | POA: Diagnosis not present

## 2015-02-06 DIAGNOSIS — Z1389 Encounter for screening for other disorder: Secondary | ICD-10-CM | POA: Diagnosis not present

## 2015-02-06 DIAGNOSIS — Z6821 Body mass index (BMI) 21.0-21.9, adult: Secondary | ICD-10-CM | POA: Diagnosis not present

## 2015-02-06 DIAGNOSIS — E039 Hypothyroidism, unspecified: Secondary | ICD-10-CM | POA: Diagnosis not present

## 2015-02-06 DIAGNOSIS — Z23 Encounter for immunization: Secondary | ICD-10-CM | POA: Diagnosis not present

## 2015-02-14 DIAGNOSIS — K219 Gastro-esophageal reflux disease without esophagitis: Secondary | ICD-10-CM | POA: Diagnosis not present

## 2015-02-14 DIAGNOSIS — R109 Unspecified abdominal pain: Secondary | ICD-10-CM | POA: Diagnosis not present

## 2015-02-14 DIAGNOSIS — F418 Other specified anxiety disorders: Secondary | ICD-10-CM | POA: Diagnosis not present

## 2015-02-15 ENCOUNTER — Telehealth: Payer: Self-pay | Admitting: Internal Medicine

## 2015-02-15 NOTE — Telephone Encounter (Signed)
Pt calling requesting her EGD be done sooner. Pt states she is still having a lot of epigastric discomfort and just feels miserable. Discussed with pt that Dr. Henrene Pastor is out of town but note would be sent to him for review when he returns to see if anything can be scheduled sooner. Please advise.

## 2015-02-19 ENCOUNTER — Encounter (HOSPITAL_COMMUNITY): Payer: Self-pay

## 2015-02-19 ENCOUNTER — Emergency Department (HOSPITAL_COMMUNITY)
Admission: EM | Admit: 2015-02-19 | Discharge: 2015-02-19 | Disposition: A | Discharge status: Home / Self Care | Payer: Commercial Managed Care - HMO | Attending: Emergency Medicine | Admitting: Emergency Medicine

## 2015-02-19 ENCOUNTER — Emergency Department (HOSPITAL_COMMUNITY): Payer: Commercial Managed Care - HMO

## 2015-02-19 DIAGNOSIS — Z79899 Other long term (current) drug therapy: Secondary | ICD-10-CM | POA: Diagnosis not present

## 2015-02-19 DIAGNOSIS — R011 Cardiac murmur, unspecified: Secondary | ICD-10-CM | POA: Insufficient documentation

## 2015-02-19 DIAGNOSIS — I10 Essential (primary) hypertension: Secondary | ICD-10-CM | POA: Insufficient documentation

## 2015-02-19 DIAGNOSIS — R1032 Left lower quadrant pain: Secondary | ICD-10-CM | POA: Diagnosis not present

## 2015-02-19 DIAGNOSIS — J449 Chronic obstructive pulmonary disease, unspecified: Secondary | ICD-10-CM | POA: Insufficient documentation

## 2015-02-19 DIAGNOSIS — K648 Other hemorrhoids: Secondary | ICD-10-CM | POA: Insufficient documentation

## 2015-02-19 DIAGNOSIS — R109 Unspecified abdominal pain: Secondary | ICD-10-CM | POA: Diagnosis not present

## 2015-02-19 DIAGNOSIS — K2970 Gastritis, unspecified, without bleeding: Secondary | ICD-10-CM | POA: Diagnosis not present

## 2015-02-19 DIAGNOSIS — K6289 Other specified diseases of anus and rectum: Secondary | ICD-10-CM

## 2015-02-19 DIAGNOSIS — Z880 Allergy status to penicillin: Secondary | ICD-10-CM | POA: Insufficient documentation

## 2015-02-19 DIAGNOSIS — R197 Diarrhea, unspecified: Secondary | ICD-10-CM | POA: Diagnosis not present

## 2015-02-19 DIAGNOSIS — R110 Nausea: Secondary | ICD-10-CM | POA: Diagnosis not present

## 2015-02-19 DIAGNOSIS — E039 Hypothyroidism, unspecified: Secondary | ICD-10-CM | POA: Insufficient documentation

## 2015-02-19 LAB — CBC WITH DIFFERENTIAL/PLATELET
Basophils Absolute: 0 10*3/uL (ref 0.0–0.1)
Basophils Relative: 0 %
Eosinophils Absolute: 0 10*3/uL (ref 0.0–0.7)
Eosinophils Relative: 0 %
HEMATOCRIT: 34.8 % — AB (ref 36.0–46.0)
HEMOGLOBIN: 11.7 g/dL — AB (ref 12.0–15.0)
LYMPHS ABS: 1 10*3/uL (ref 0.7–4.0)
Lymphocytes Relative: 19 %
MCH: 29.4 pg (ref 26.0–34.0)
MCHC: 33.6 g/dL (ref 30.0–36.0)
MCV: 87.4 fL (ref 78.0–100.0)
MONOS PCT: 9 %
Monocytes Absolute: 0.5 10*3/uL (ref 0.1–1.0)
NEUTROS ABS: 3.9 10*3/uL (ref 1.7–7.7)
NEUTROS PCT: 72 %
Platelets: 296 10*3/uL (ref 150–400)
RBC: 3.98 MIL/uL (ref 3.87–5.11)
RDW: 13.2 % (ref 11.5–15.5)
WBC: 5.4 10*3/uL (ref 4.0–10.5)

## 2015-02-19 LAB — COMPREHENSIVE METABOLIC PANEL
ALK PHOS: 64 U/L (ref 38–126)
ALT: 21 U/L (ref 14–54)
ANION GAP: 11 (ref 5–15)
AST: 29 U/L (ref 15–41)
Albumin: 3.6 g/dL (ref 3.5–5.0)
BILIRUBIN TOTAL: 0.7 mg/dL (ref 0.3–1.2)
BUN: 11 mg/dL (ref 6–20)
CALCIUM: 9.5 mg/dL (ref 8.9–10.3)
CO2: 25 mmol/L (ref 22–32)
Chloride: 98 mmol/L — ABNORMAL LOW (ref 101–111)
Creatinine, Ser: 0.82 mg/dL (ref 0.44–1.00)
GFR calc non Af Amer: >60 mL/min (ref >60)
Glucose, Bld: 98 mg/dL (ref 65–99)
Potassium: 4.2 mmol/L (ref 3.5–5.1)
Sodium: 134 mmol/L — ABNORMAL LOW (ref 135–145)
TOTAL PROTEIN: 7.2 g/dL (ref 6.5–8.1)

## 2015-02-19 LAB — URINALYSIS, ROUTINE W REFLEX MICROSCOPIC
BILIRUBIN URINE: NEGATIVE
GLUCOSE, UA: NEGATIVE mg/dL
HGB URINE DIPSTICK: NEGATIVE
Ketones, ur: NEGATIVE mg/dL
Leukocytes, UA: NEGATIVE
Nitrite: NEGATIVE
PROTEIN: NEGATIVE mg/dL
SPECIFIC GRAVITY, URINE: 1.004 — AB (ref 1.005–1.030)
UROBILINOGEN UA: 0.2 mg/dL (ref 0.0–1.0)
pH: 6.5 (ref 5.0–8.0)

## 2015-02-19 LAB — LIPASE, BLOOD: Lipase: 30 U/L (ref 22–51)

## 2015-02-19 LAB — POC OCCULT BLOOD, ED: FECAL OCCULT BLD: NEGATIVE

## 2015-02-19 MED ORDER — SODIUM CHLORIDE 0.9 % IV BOLUS (SEPSIS)
1000.0000 mL | Freq: Once | INTRAVENOUS | Status: AC
  Administered 2015-02-19: 1000 mL via INTRAVENOUS

## 2015-02-19 MED ORDER — LIDOCAINE HCL 2 % EX GEL
1.0000 "application " | Freq: Once | CUTANEOUS | Status: AC
  Administered 2015-02-19: 1 via TOPICAL
  Filled 2015-02-19: qty 20

## 2015-02-19 MED ORDER — LIDOCAINE-HYDROCORTISONE ACE 3-2.5 % RE KIT: 1.0000 "application " | PACK | Freq: Two times a day (BID) | RECTAL | Status: DC

## 2015-02-19 MED ORDER — BISACODYL 5 MG PO TBEC: 5.0000 mg | DELAYED_RELEASE_TABLET | Freq: Two times a day (BID) | ORAL | Status: DC

## 2015-02-19 MED ORDER — IOHEXOL 300 MG/ML  SOLN: 100.0000 mL | Freq: Once | INTRAMUSCULAR | Status: DC | PRN

## 2015-02-19 MED ORDER — HYDROCORTISONE ACETATE 25 MG RE SUPP
25.0000 mg | Freq: Once | RECTAL | Status: AC
  Administered 2015-02-19: 25 mg via RECTAL
  Filled 2015-02-19: qty 1

## 2015-02-19 MED ORDER — HYDROMORPHONE HCL 1 MG/ML IJ SOLN
0.5000 mg | Freq: Once | INTRAMUSCULAR | Status: AC
  Administered 2015-02-19: 0.5 mg via INTRAVENOUS
  Filled 2015-02-19: qty 1

## 2015-02-19 MED ORDER — IOHEXOL 300 MG/ML  SOLN
25.0000 mL | INTRAMUSCULAR | Status: AC
  Administered 2015-02-19: 25 mL via ORAL

## 2015-02-19 MED ORDER — HYDROCORTISONE 2.5 % RE CREA
1.0000 | TOPICAL_CREAM | Freq: Two times a day (BID) | RECTAL | Status: DC
Start: 2015-02-19 — End: 2015-02-19

## 2015-02-19 MED ORDER — ONDANSETRON HCL 4 MG/2ML IJ SOLN
4.0000 mg | Freq: Once | INTRAMUSCULAR | Status: AC
  Administered 2015-02-19: 4 mg via INTRAVENOUS
  Filled 2015-02-19: qty 2

## 2015-02-19 NOTE — ED Provider Notes (Signed)
CSN: [id number]     Arrival date & time 02/19/15  1524 History   First MD Initiated Contact with Patient 02/19/15 1526     Chief Complaint  Patient presents with  . Rectal Pain   Patient is a 81 y.o. female presenting with general illness. The history is provided by the patient. No language interpreter was used.  Illness Location:  Rectum Quality:  Pain Severity:  Moderate Onset quality:  Gradual Timing:  Constant Progression:  Worsening Chronicity:  New Context:  PMHx of HTN, hypothyroidism, OA, diverticulosis, & COPD presenting with rectal pain. PSHx including cholecystectomy x6 weeks ago. Gradual onset abdominal pain since then. Associated with nausea but no emesis. Denies fever or chills. Patient seen by PCP and given milk of magnesia. Patient is now having diarhea. Of note, patient had CT abdomen pelvis with contrast on 01/18/15 showing no acute intra-abdominal pathology but noting diverticulosis. Associated symptoms: abdominal pain, diarrhea and nausea   Associated symptoms: no chest pain, no congestion, no fever, no headaches, no shortness of breath and no vomiting     Past Medical History  Diagnosis Date  . Hypertension   . Autoimmune disease     positive ANA, double stranded DNA, anti-RO and anti- LA  . Osteoporosis   . Colonic adenoma   . Hypothyroidism   . COPD (chronic obstructive pulmonary disease)     non smoker  . Heart murmur   . Diverticulosis   . Cholelithiasis    Past Surgical History  Procedure Laterality Date  . Cholecystectomy N/A 01/11/2015    Procedure: LAPAROSCOPIC CHOLECYSTECTOMY WITH INTRAOPERATIVE CHOLANGIOGRAM;  Surgeon: Armandina Gemma, MD;  Location: [facility];  Service: General;  Laterality: N/A;   Family History  Problem Relation Age of Onset  . Heart disease Father   . Stomach cancer Brother   . Prostate cancer Brother   . Alzheimer's disease Mother    Social History  Substance Use Topics  . Smoking status: Never Smoker   . Smokeless tobacco:  Never Used  . Alcohol Use: 0.0 oz/week    0 Standard drinks or equivalent per week     Comment: Occ. alcohol   OB History    No data available      Review of Systems  Constitutional: Positive for chills. Negative for fever.  HENT: Negative for congestion.   Respiratory: Negative for shortness of breath.   Cardiovascular: Negative for chest pain.  Gastrointestinal: Positive for nausea, abdominal pain, diarrhea and rectal pain. Negative for vomiting, constipation, blood in stool, abdominal distention and anal bleeding.  Genitourinary: Positive for frequency. Negative for dysuria, urgency and hematuria.  Neurological: Negative for headaches.  All other systems reviewed and are negative.   Allergies  Aspirin; Amoxicillin; and Penicillins  Home Medications   Prior to Admission medications   Medication Sig Start Date End Date Taking? Authorizing Provider  acetaminophen (TYLENOL) 325 MG tablet Take 2 tablets (650 mg total) by mouth every 6 (six) hours as needed for mild pain (or temp > 100). 01/12/15  Yes Earnstine Regal, PA-C  Cyanocobalamin (VITAMIN B-12) 1000 MCG/15ML LIQD Take 15 mLs by mouth daily.   Yes Historical Provider, MD  dicyclomine (BENTYL) 20 MG tablet Take 1 tablet by mouth daily as needed for spasms.  01/29/15  Yes Historical Provider, MD  escitalopram (LEXAPRO) 10 MG tablet Take 0.5 tablets by mouth 2 (two) times daily. 01/29/15  Yes Historical Provider, MD  gabapentin (NEURONTIN) 100 MG capsule Take 100 mg by mouth 2 (two) times  daily.   Yes Historical Provider, MD  hydroxypropyl methylcellulose / hypromellose (ISOPTO TEARS / GONIOVISC) 2.5 % ophthalmic solution Place 1 drop into both eyes daily as needed for dry eyes.   Yes Historical Provider, MD  lisinopril (PRINIVIL,ZESTRIL) 20 MG tablet Take 20 mg by mouth daily. 10/11/14  Yes Historical Provider, MD  LORazepam (ATIVAN) 0.5 MG tablet Take 0.25 mg by mouth 2 (two) times daily.  12/19/14  Yes Historical Provider, MD   ondansetron (ZOFRAN) 4 MG tablet Take 1 tablet (4 mg total) by mouth every 6 (six) hours. Patient taking differently: Take 4 mg by mouth every 6 (six) hours as needed for nausea or vomiting.  12/29/14  Yes Geronimo Boot, MD  pantoprazole (PROTONIX) 40 MG tablet One tablet orally qam 30 mins before meals Patient taking differently: Take 40 mg by mouth daily. One tablet orally qam 30 mins before meals 01/31/15  Yes Lori P Hvozdovic, PA-C  saccharomyces boulardii (FLORASTOR) 250 MG capsule Take 1 capsule (250 mg total) by mouth 2 (two) times daily. Patient taking differently: Take 250-500 mg by mouth 2 (two) times daily.  12/26/14  Yes Amy S Esterwood, PA-C  SYNTHROID 125 MCG tablet Take 125 mcg by mouth daily. 09/22/14  Yes Historical Provider, MD  bisacodyl (DULCOLAX) 5 MG EC tablet Take 1 tablet (5 mg total) by mouth 2 (two) times daily. 02/19/15   Mayer Camel, MD  Lidocaine-Hydrocortisone Ace 3-2.5 % KIT Place 1 application rectally 2 (two) times daily. 02/19/15   Mayer Camel, MD   BP 148/65 mmHg  Pulse 61  Temp(Src) 97.6 F (36.4 C) (Oral)  Resp 16  SpO2 96%   Physical Exam  Constitutional: She is oriented to person, place, and time. She appears well-developed and well-nourished. She appears distressed.  HENT:  Head: Normocephalic and atraumatic.  Eyes: Conjunctivae are normal. Pupils are equal, round, and reactive to light.  Neck: Normal range of motion. Neck supple.  Cardiovascular: Normal rate, regular rhythm and intact distal pulses.   Hypertensive with SBP's 170's  Pulmonary/Chest: Effort normal and breath sounds normal. No respiratory distress. She has no wheezes.  Abdominal: Soft. Bowel sounds are normal. She exhibits no distension. There is tenderness (entire lower abdomen worse in LLQ).  No CVA TTP  Musculoskeletal: Normal range of motion.  Neurological: She is alert and oriented to person, place, and time.  Skin: Skin is warm and dry. She is not diaphoretic.  Nursing  note and vitals reviewed.   ED Course  Procedures   Labs Review Labs Reviewed  CBC WITH DIFFERENTIAL/PLATELET - Abnormal; Notable for the following:    Hemoglobin 11.7 (*)    HCT 34.8 (*)    All other components within normal limits  COMPREHENSIVE METABOLIC PANEL - Abnormal; Notable for the following:    Sodium 134 (*)    Chloride 98 (*)    All other components within normal limits  URINALYSIS, ROUTINE W REFLEX MICROSCOPIC (NOT AT [facility]) - Abnormal; Notable for the following:    Specific Gravity, Urine 1.004 (*)    All other components within normal limits  LIPASE, BLOOD  OCCULT BLOOD X 1 CARD TO LAB, STOOL  POC OCCULT BLOOD, ED   Imaging Review Dg Abd 1 View  02/19/2015   CLINICAL DATA:  Abdominal pain and nausea and diarrhea for 2 weeks  EXAM: ABDOMEN - 1 VIEW  COMPARISON:  01/27/2015  FINDINGS: Scattered large and small bowel gas is noted. No obstructive changes or free air is seen. No acute  bony abnormality is noted. No focal mass lesion is noted.  IMPRESSION: No acute abnormality noted.   Electronically Signed   By: Inez Catalina M.D.   On: 02/19/2015 17:26   Ct Abdomen Pelvis W Contrast  02/19/2015   CLINICAL DATA:  Left lower quadrant pain and rectal pain as well as diarrhea. Evaluate for diverticulitis/ abscess. Recent cholecystectomy.  EXAM: CT ABDOMEN AND PELVIS WITH CONTRAST  TECHNIQUE: Multidetector CT imaging of the abdomen and pelvis was performed using the standard protocol following bolus administration of intravenous contrast.  CONTRAST:  100 mL Omnipaque 300 IV  COMPARISON:  01/18/2015  FINDINGS: Lung bases are within normal.  Abdominal images demonstrate evidence of a previous cholecystectomy with minimal postcholecystectomy prominence of the common bowel duct. Couple tiny subcentimeter splenic hypodensities unchanged and too small to characterize. Liver, pancreas and adrenal glands are within normal. Appendix not definitely visualized.  Kidneys are normal in size without  hydronephrosis. There is a punctate nonobstructing stone over the lower pole of the left kidney. There are couple tiny subcentimeter left renal cortical hypodensities too small to characterize but likely cysts. Ureters are normal.  Moderate calcified plaque over the abdominal aorta. Mesentery is within normal. There is no free fluid or focal inflammatory change. There is mild diverticulosis of the colon without acute diverticulitis. Small bowel is unremarkable.  Pelvic images demonstrate minimal bladder distension. The uterus, ovaries and rectum are within normal. There is no free fluid.  There mild degenerate changes of the spine and mild-to-moderate degenerative change of the hips.  IMPRESSION: No acute findings in the abdomen/pelvis.  Mild diverticulosis of the colon without active inflammation.  Nonobstructing punctate left renal stone. Few tiny subcentimeter left renal cortical hypodensities too small to characterize but likely cysts.   Electronically Signed   By: Marin Olp M.D.   On: 02/19/2015 21:19   I have personally reviewed and evaluated these images and lab results as part of my medical decision-making.   EKG Interpretation None      MDM  Ms. Sidle is an 81 yo female w/ PMHx of HTN, hypothyroidism, OA, diverticulosis, & COPD presenting with rectal pain. PSHx including cholecystectomy x6 weeks ago. Gradual onset abdominal pain since then. Associated with nausea but no emesis as well as chills. Denies fever. Patient seen by PCP and given milk of magnesia. Patient is now having diarhea. Of note, patient had CT abdomen pelvis with contrast on 01/18/15 showing no acute intra-abdominal pathology but noting diverticulosis.  Exam above notable for elderly female lying in stretcher in mild distress secondary to pain. Afebrile. Heart rate 70's. Mildly hypertensive with SBP's in 170's. Abdominal exam notable for tenderness to palpation of entire lower quadrant worse in LLQ without guarding or  rebound. No CVA tenderness. Hemoccult negative.  Procedure Note: Full anoscopy exam at bedside showing internal hemorrhoid without active bleeding and an external hemorrhoid that does not appear to be thrombosed.  Concern for hemorrhoids versus diverticulitis versus intra-abdominal abscess versus enteritis. UA showing no signs of severe dehydration or blood or infection. Patient given IV fluids and IV analgesia and IV antiemetics. WBC 5.4. Hemoglobin 11.7. CT abdomen pelvis showing no acute intra-abdominal processes, mild diverticulosis without diverticulitis, and nonobstructing punctate left renal stone.  Laboratory and imaging results were personally reviewed by myself and used in the medical decision making of this patient's treatment and disposition.  Patient's pain appears to be caused by internal hemorrhoids. Patient given hydrocortisone suppository here and discharged home with stool softener, lidoderm/hydrocortisone  suppository, and instructions for sitz baths. Patient will follow-up with her PCP later this week to discuss long-term constipation medication regimen at home. Patient and husband understand and agree with the plan and have no further questions or concerns this time.  Patient care discussed with and followed by my attending, Dr. Zachery Conch, MD Pager [phone]  Final diagnoses:  Internal hemorrhoid  Rectal pain    Mayer Camel, MD 02/19/15 [place], MD 02/27/15 2054

## 2015-02-19 NOTE — ED Notes (Signed)
Pt finished drinking contrast; ct aware

## 2015-02-19 NOTE — ED Notes (Signed)
Pt. Presents with complaint of intense hypoumbilical pain and rectal pain. Pt. Had gallbladder removed approx 6 weeks ago, given narcotics for pain and has since had constipation. Txt by PCP for constipation, since has minimal watery stools. Pt. Ambulatory, AxO x4.

## 2015-02-19 NOTE — ED Provider Notes (Signed)
Patient presented to the ER with abdominal and rectal pain. She has been having ongoing issues with this since gallbladder removal 6 weeks ago. She was treated for constipation by primary doctor, having increased pain.  Face to face Exam: HEENT - PERRLA Lungs - CTAB Heart - RRR, no M/R/G Abd - soft, nondistended; tenderness in suprapubic region and left lower quadrant Neuro - alert, oriented x3  Plan: Obtain CT scan to rule out colitis/diverticulitis.  Orpah Greek, MD 02/19/15 [phone]

## 2015-02-19 NOTE — Telephone Encounter (Signed)
Left message for pt to call back. Slot at 10:30am on 02/21/15 held.

## 2015-02-19 NOTE — ED Notes (Signed)
Informed EDP of patient's pain

## 2015-02-19 NOTE — Telephone Encounter (Signed)
There is an opening in [place] this Wed 9-21 at 11 am

## 2015-02-19 NOTE — ED Notes (Signed)
MD at bedside. 

## 2015-02-20 ENCOUNTER — Telehealth: Payer: Self-pay | Admitting: *Deleted

## 2015-02-20 MED ORDER — IOHEXOL 300 MG/ML  SOLN
100.0000 mL | Freq: Once | INTRAMUSCULAR | Status: AC | PRN
  Administered 2015-02-19: 100 mL via INTRAVENOUS

## 2015-02-20 NOTE — Telephone Encounter (Signed)
Called pharmacy back related to Rx: Lidocaine-Hydrocortisone Ace 3-2.5 % KIT..NCM clarified with EDP to change Rx to:  Hydrocortisone 2-2% KIT.

## 2015-02-20 NOTE — Telephone Encounter (Signed)
Pt scheduled for EGD in the [place] 02/21/15@11am . Pt aware and knows to arrive at 10am and be NPO after midnight.

## 2015-02-21 ENCOUNTER — Encounter: Payer: Self-pay | Admitting: Internal Medicine

## 2015-02-21 ENCOUNTER — Ambulatory Visit (AMBULATORY_SURGERY_CENTER): Payer: Commercial Managed Care - HMO | Admitting: Internal Medicine

## 2015-02-21 VITALS — BP 152/72 | HR 69 | Temp 98.5°F | Resp 16 | Ht 64.0 in | Wt 128.0 lb

## 2015-02-21 DIAGNOSIS — R1013 Epigastric pain: Secondary | ICD-10-CM

## 2015-02-21 DIAGNOSIS — B3781 Candidal esophagitis: Secondary | ICD-10-CM

## 2015-02-21 DIAGNOSIS — I10 Essential (primary) hypertension: Secondary | ICD-10-CM | POA: Diagnosis not present

## 2015-02-21 DIAGNOSIS — R197 Diarrhea, unspecified: Secondary | ICD-10-CM

## 2015-02-21 DIAGNOSIS — J449 Chronic obstructive pulmonary disease, unspecified: Secondary | ICD-10-CM | POA: Diagnosis not present

## 2015-02-21 DIAGNOSIS — R109 Unspecified abdominal pain: Secondary | ICD-10-CM | POA: Diagnosis not present

## 2015-02-21 MED ORDER — SODIUM CHLORIDE 0.9 % IV SOLN: 500.0000 mL | INTRAVENOUS | Status: DC

## 2015-02-21 MED ORDER — FLUCONAZOLE 100 MG PO TABS: ORAL_TABLET | ORAL | Status: DC

## 2015-02-21 NOTE — Progress Notes (Signed)
Transferred to recovery room. A/O x3, pleased with MAC.  VSS.  Report to Annette, RN. 

## 2015-02-21 NOTE — Patient Instructions (Signed)
YOU HAD AN ENDOSCOPIC PROCEDURE TODAY AT [facility]:   Refer to the procedure report that was given to you for any specific questions about what was found during the examination.  If the procedure report does not answer your questions, please call your gastroenterologist to clarify.  If you requested that your care partner not be given the details of your procedure findings, then the procedure report has been included in a sealed envelope for you to review at your convenience later.  YOU SHOULD EXPECT: Some feelings of bloating in the abdomen. Passage of more gas than usual.  Walking can help get rid of the air that was put into your GI tract during the procedure and reduce the bloating  Please Note:  You might notice some irritation and congestion in your nose or some drainage.  This is from the oxygen used during your procedure.  There is no need for concern and it should clear up in a day or so.  SYMPTOMS TO REPORT IMMEDIATELY:  Following upper endoscopy (EGD)  Vomiting of blood or coffee ground material  New chest pain or pain under the shoulder blades  Painful or persistently difficult swallowing  New shortness of breath  Fever of 100F or higher  Black, tarry-looking stools  For urgent or emergent issues, a gastroenterologist can be reached at any hour by calling [phone].   DIET: Your first meal following the procedure should be a small meal and then it is ok to progress to your normal diet. Heavy or fried foods are harder to digest and may make you feel nauseous or bloated.  Likewise, meals heavy in dairy and vegetables can increase bloating.  Drink plenty of fluids but you should avoid alcoholic beverages for 24 hours.  ACTIVITY:  You should plan to take it easy for the rest of today and you should NOT DRIVE or use heavy machinery until tomorrow (because of the sedation medicines used during the test).    FOLLOW UP: Our staff will call the number listed on  your records the next business day following your procedure to check on you and address any questions or concerns that you may have regarding the information given to you following your procedure. If we do not reach you, we will leave a message.  However, if you are feeling well and you are not experiencing any problems, there is no need to return our call.  We will assume that you have returned to your regular daily activities without incident.  If any biopsies were taken you will be contacted by phone or by letter within the next 1-3 weeks.  Please call us at [phone] if you have not heard about the biopsies in 3 weeks.   SIGNATURES/CONFIDENTIALITY: You and/or your care partner have signed paperwork which will be entered into your electronic medical record.  These signatures attest to the fact that that the information above on your After Visit Summary has been reviewed and is understood.  Full responsibility of the confidentiality of this discharge information lies with you and/or your care-partner.  Diflucan prescription was sent to your [place] will be notified of biopsy results  Please return to care of Dr. Joylene Draft- call Dr. Blanch Media office with any further GI needs  Continue your normal medications

## 2015-02-21 NOTE — Op Note (Signed)
[place]  [employer] [address]. [place], [address]   ENDOSCOPY PROCEDURE REPORT  PATIENT: Sarah, Manning  MR#: [id number] BIRTHDATE: 01-Nov-1924 , 89  yrs. old GENDER: female ENDOSCOPIST: Eustace Quail, MD REFERRED BY:  Crist Infante, M.D. PROCEDURE DATE:  02/21/2015 PROCEDURE:  EGD w/ biopsy ASA CLASS:     Class II INDICATIONS:  epigastric pain, nausea, anorexia, weight loss, substernal chest burning. MEDICATIONS: Monitored anesthesia care and Propofol 120 mg IV TOPICAL ANESTHETIC: none  DESCRIPTION OF PROCEDURE: After the risks benefits and alternatives of the procedure were thoroughly explained, informed consent was obtained.  The LB [id number] endoscope was introduced through the mouth and advanced to the second portion of the duodenum , Without limitations.  The instrument was slowly withdrawn as the mucosa was fully examined.   EXAM:Esophagus revealed multiple whitish plaques in the distal 10 cm consistent with Candida esophagitis.  Multiple biopsies taken.  The stomach and duodenum were normal.  Retroflexed views revealed no abnormalities.     The scope was then withdrawn from the patient and the procedure completed.  COMPLICATIONS: There were no immediate complications.  ENDOSCOPIC IMPRESSION: 1. Candida esophagitis. This may very well explain a number of her upper GI complaints , but not several non-GI complaints  RECOMMENDATIONS: 1. Follow-up biopsies 2. Prescribe Diflucan 100 mg; dispense 8; take 200 mg on day one then 100 mg daily until completed 3. Return to the care of Dr. Joylene Draft. GI follow-up as needed  REPEAT EXAM:  eSigned:  Eustace Quail, MD 02/21/2015 12:05 PM    CC:The Patient and Crist Infante, MD

## 2015-02-21 NOTE — Progress Notes (Signed)
Called to room to assist during endoscopic procedure.  Patient ID and intended procedure confirmed with present staff. Received instructions for my participation in the procedure from the performing physician.  

## 2015-02-21 NOTE — Progress Notes (Signed)
Cori Razor, CMA assisted pt with her upper and lower dentures in pt's mouth before pt's care partner went into recovery room. maw

## 2015-02-22 ENCOUNTER — Telehealth: Payer: Self-pay | Admitting: Emergency Medicine

## 2015-02-22 NOTE — Telephone Encounter (Signed)
  Follow up Call-  Call back number 02/21/2015  Post procedure Call Back phone  # [phone]  Permission to leave phone message Yes     Patient questions:  Do you have a fever, pain , or abdominal swelling? No. Pain Score  0 *  Have you tolerated food without any problems? Yes.    Have you been able to return to your normal activities? Yes.    Do you have any questions about your discharge instructions: Diet   No. Medications  No. Follow up visit  No.  Do you have questions or concerns about your Care? No.  Actions: * If pain score is 4 or above: No action needed, pain <4.

## 2015-02-26 ENCOUNTER — Encounter: Payer: Self-pay | Admitting: Internal Medicine

## 2015-02-28 DIAGNOSIS — E039 Hypothyroidism, unspecified: Secondary | ICD-10-CM | POA: Diagnosis not present

## 2015-02-28 DIAGNOSIS — K6289 Other specified diseases of anus and rectum: Secondary | ICD-10-CM | POA: Diagnosis not present

## 2015-02-28 DIAGNOSIS — B3781 Candidal esophagitis: Secondary | ICD-10-CM | POA: Diagnosis not present

## 2015-02-28 DIAGNOSIS — Z6821 Body mass index (BMI) 21.0-21.9, adult: Secondary | ICD-10-CM | POA: Diagnosis not present

## 2015-02-28 DIAGNOSIS — R197 Diarrhea, unspecified: Secondary | ICD-10-CM | POA: Diagnosis not present

## 2015-02-28 DIAGNOSIS — I10 Essential (primary) hypertension: Secondary | ICD-10-CM | POA: Diagnosis not present

## 2015-02-28 DIAGNOSIS — F418 Other specified anxiety disorders: Secondary | ICD-10-CM | POA: Diagnosis not present

## 2015-04-03 ENCOUNTER — Encounter: Payer: Commercial Managed Care - HMO | Admitting: Internal Medicine

## 2015-04-25 DIAGNOSIS — I10 Essential (primary) hypertension: Secondary | ICD-10-CM | POA: Diagnosis not present

## 2015-04-25 DIAGNOSIS — E039 Hypothyroidism, unspecified: Secondary | ICD-10-CM | POA: Diagnosis not present

## 2015-04-25 DIAGNOSIS — Z6821 Body mass index (BMI) 21.0-21.9, adult: Secondary | ICD-10-CM | POA: Diagnosis not present

## 2015-04-25 DIAGNOSIS — F418 Other specified anxiety disorders: Secondary | ICD-10-CM | POA: Diagnosis not present

## 2015-05-09 DIAGNOSIS — Z1231 Encounter for screening mammogram for malignant neoplasm of breast: Secondary | ICD-10-CM | POA: Diagnosis not present

## 2015-05-31 DIAGNOSIS — N390 Urinary tract infection, site not specified: Secondary | ICD-10-CM | POA: Diagnosis not present

## 2015-05-31 DIAGNOSIS — R8299 Other abnormal findings in urine: Secondary | ICD-10-CM | POA: Diagnosis not present

## 2015-05-31 DIAGNOSIS — I10 Essential (primary) hypertension: Secondary | ICD-10-CM | POA: Diagnosis not present

## 2015-05-31 DIAGNOSIS — E782 Mixed hyperlipidemia: Secondary | ICD-10-CM | POA: Diagnosis not present

## 2015-05-31 DIAGNOSIS — E038 Other specified hypothyroidism: Secondary | ICD-10-CM | POA: Diagnosis not present

## 2015-05-31 DIAGNOSIS — E559 Vitamin D deficiency, unspecified: Secondary | ICD-10-CM | POA: Diagnosis not present

## 2015-06-07 DIAGNOSIS — R011 Cardiac murmur, unspecified: Secondary | ICD-10-CM | POA: Diagnosis not present

## 2015-06-07 DIAGNOSIS — B3781 Candidal esophagitis: Secondary | ICD-10-CM | POA: Diagnosis not present

## 2015-06-07 DIAGNOSIS — E038 Other specified hypothyroidism: Secondary | ICD-10-CM | POA: Diagnosis not present

## 2015-06-07 DIAGNOSIS — F418 Other specified anxiety disorders: Secondary | ICD-10-CM | POA: Diagnosis not present

## 2015-06-07 DIAGNOSIS — M79672 Pain in left foot: Secondary | ICD-10-CM | POA: Diagnosis not present

## 2015-06-07 DIAGNOSIS — K6289 Other specified diseases of anus and rectum: Secondary | ICD-10-CM | POA: Diagnosis not present

## 2015-06-07 DIAGNOSIS — Z0000 Encounter for general adult medical examination without abnormal findings: Secondary | ICD-10-CM | POA: Diagnosis not present

## 2015-06-07 DIAGNOSIS — G629 Polyneuropathy, unspecified: Secondary | ICD-10-CM | POA: Diagnosis not present

## 2015-06-07 DIAGNOSIS — M79671 Pain in right foot: Secondary | ICD-10-CM | POA: Diagnosis not present

## 2015-07-10 DIAGNOSIS — H25813 Combined forms of age-related cataract, bilateral: Secondary | ICD-10-CM | POA: Diagnosis not present

## 2015-08-01 DIAGNOSIS — M810 Age-related osteoporosis without current pathological fracture: Secondary | ICD-10-CM | POA: Diagnosis not present

## 2015-08-01 DIAGNOSIS — E038 Other specified hypothyroidism: Secondary | ICD-10-CM | POA: Diagnosis not present

## 2015-08-01 DIAGNOSIS — E559 Vitamin D deficiency, unspecified: Secondary | ICD-10-CM | POA: Diagnosis not present

## 2015-10-13 IMAGING — CR DG ABDOMEN ACUTE W/ 1V CHEST
3 series · 3 of 3 positions shown · non-contrast
Comparison: CT the abdomen and pelvis 01/18/2015. Chest x-ray
01/07/2015.

CLINICAL DATA: 89-year-old female with abdominal pain and nausea
today. Intermittent abdominal pain for the past 3 months.

EXAM:
DG ABDOMEN ACUTE W/ 1V CHEST

[w chest pa]
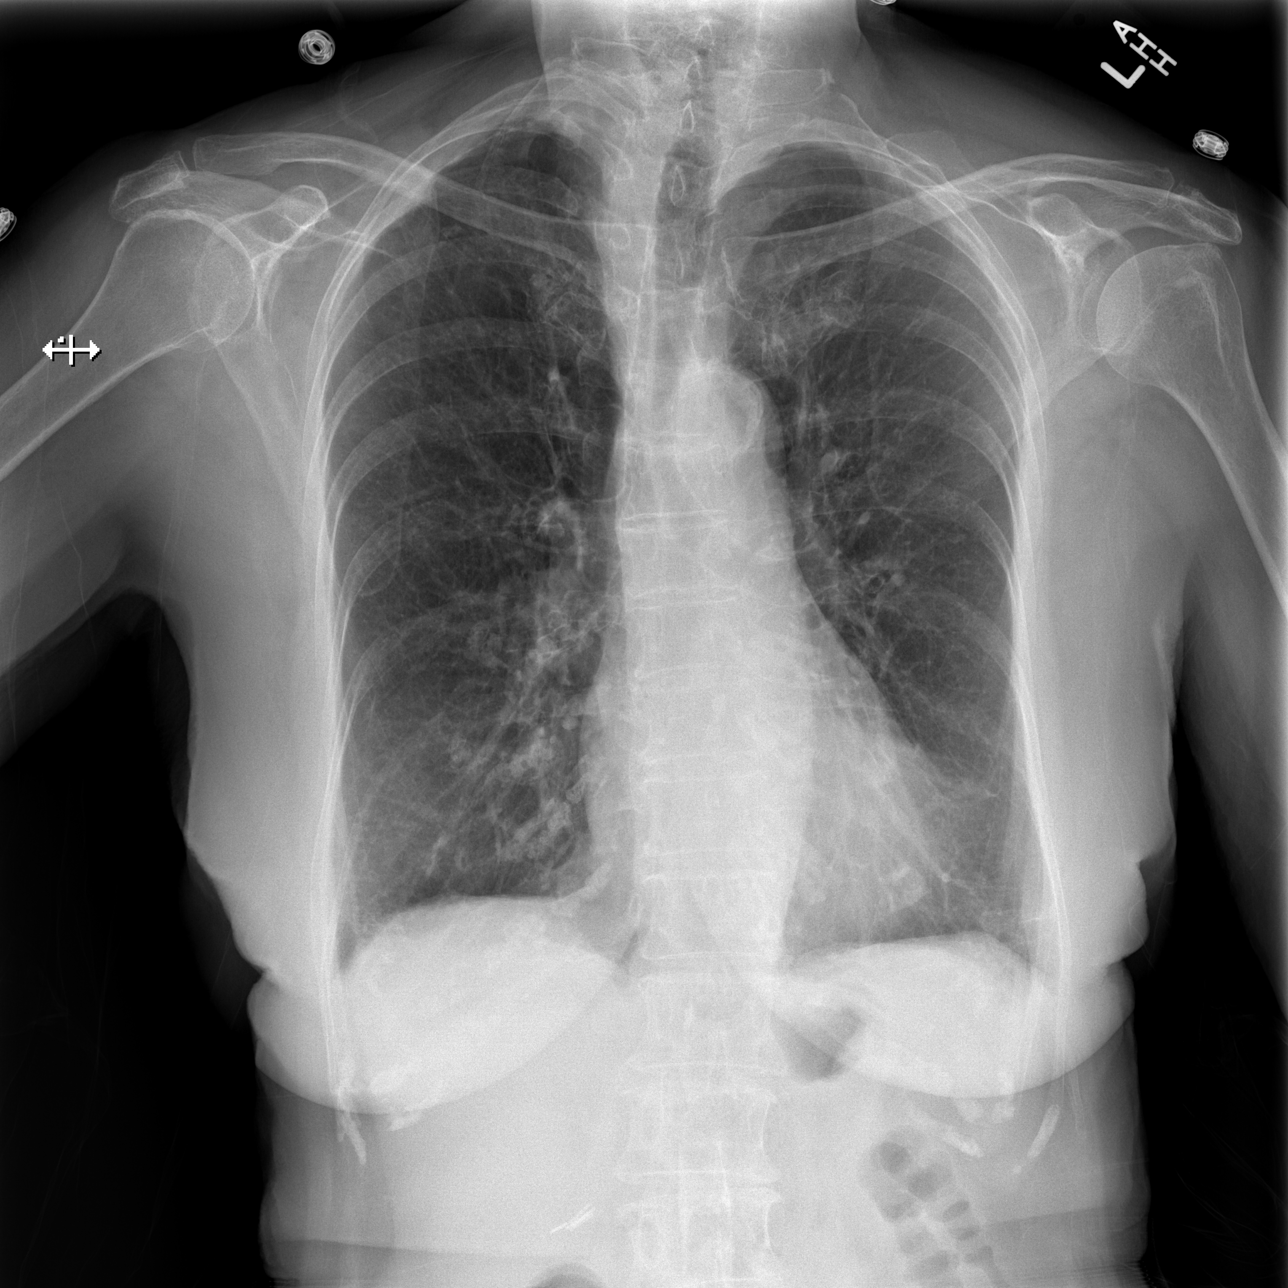

[w abdomen upright]
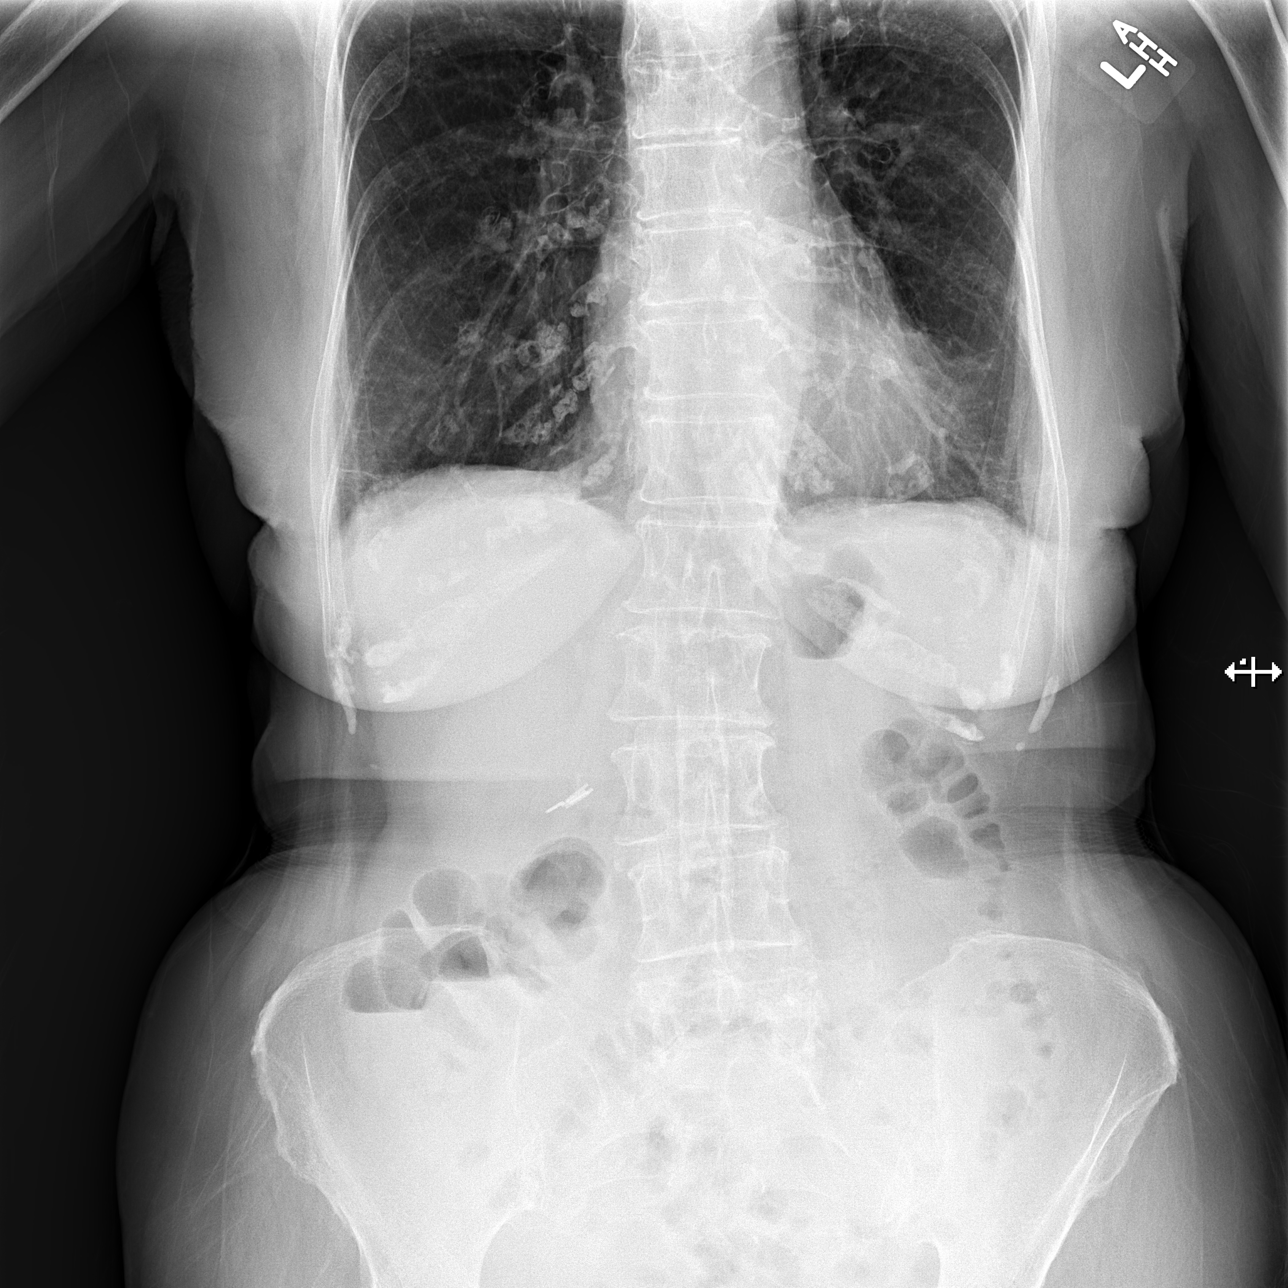

[t abdomen supine]
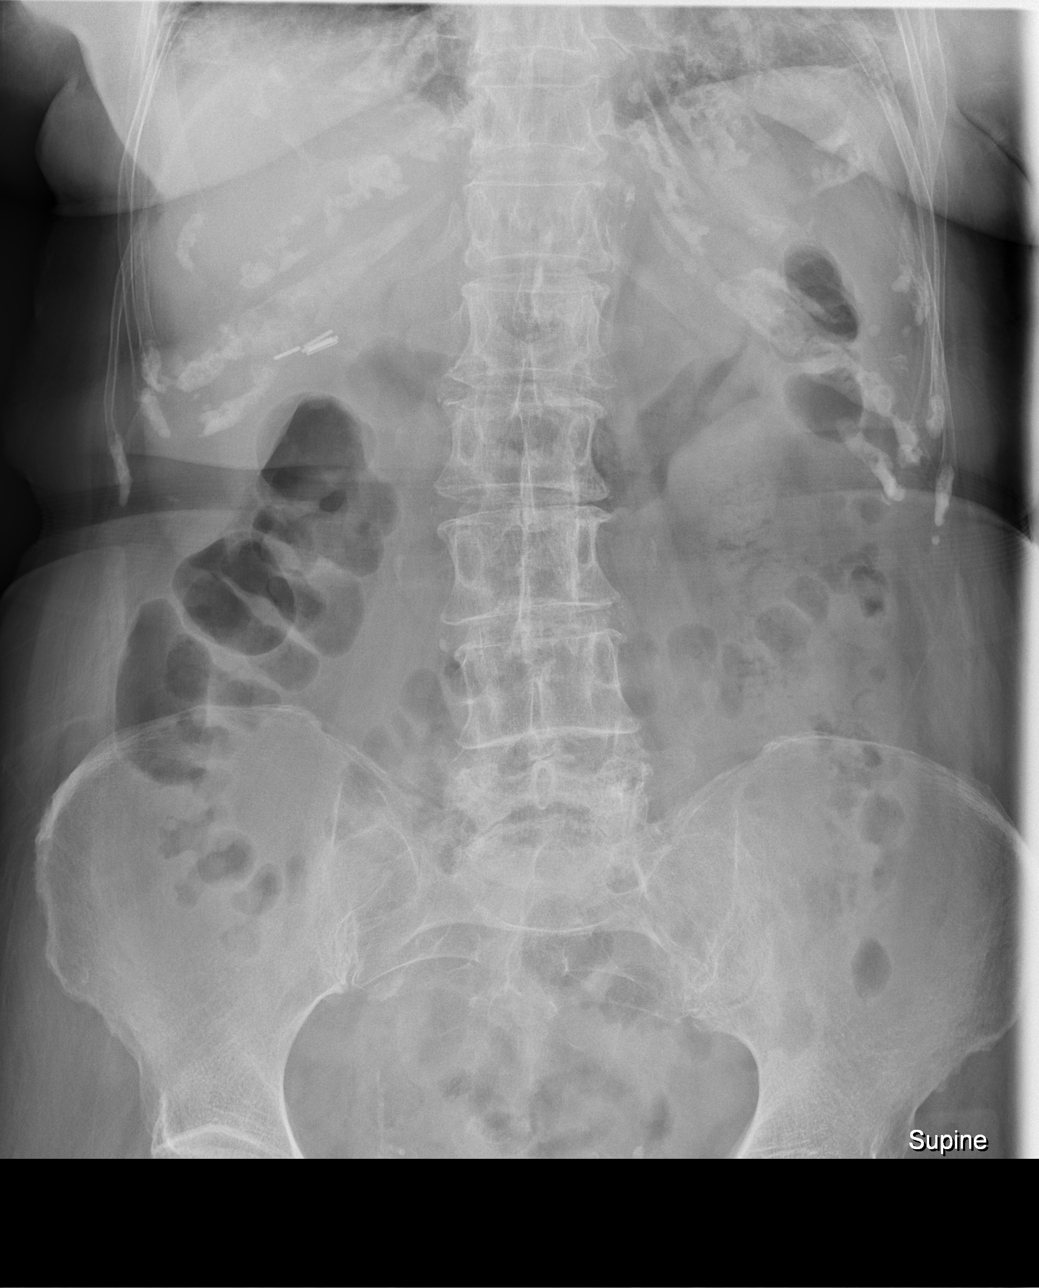

[3 of 3 positions shown; findings below may reference images not displayed]

FINDINGS: Lung volumes are normal. No consolidative airspace disease. No
pleural effusions. No pneumothorax. No pulmonary nodule or mass
noted. Pulmonary vasculature and the cardiomediastinal silhouette
are within normal limits. Atherosclerosis in the thoracic aorta.
Prominent costochondral cartilage calcifications incidentally noted.

Supine and upright views of the abdomen demonstrate gas and stool
scattered throughout the colon extending to the level of the distal
rectum. No pathologic dilatation of small bowel or colon. Surgical
clips project over the right upper quadrant of the abdomen,
compatible with prior cholecystectomy. Numerous pelvic phleboliths.
IMPRESSION: 1. Nonobstructive bowel gas pattern.
2. No pneumoperitoneum.
3. No radiographic evidence of acute cardiopulmonary disease.
4. Atherosclerosis.

## 2015-10-18 DIAGNOSIS — E038 Other specified hypothyroidism: Secondary | ICD-10-CM | POA: Diagnosis not present

## 2015-10-18 DIAGNOSIS — E782 Mixed hyperlipidemia: Secondary | ICD-10-CM | POA: Diagnosis not present

## 2016-03-15 DIAGNOSIS — Z23 Encounter for immunization: Secondary | ICD-10-CM | POA: Diagnosis not present

## 2016-03-18 ENCOUNTER — Other Ambulatory Visit: Payer: Self-pay

## 2016-03-18 NOTE — Patient Outreach (Signed)
[facility]) Care Management  03/18/2016  Sarah Manning 05-Jul-1924 [id number]  REFERRAL DATE; 03/06/16  REFERRAL SOURCE: EMMI Prevent referral REFERRAL REASON:  Telephone screening  SUBJECTIVE: Telephone call to patient regarding EMMI prevent referral.  HIPAA verified with patient. Discussed and offered [facility] care management services to patient. Patient states she is managing her health care well.  Patient states her only concern is that she gets constipated at times.  Patient states she had her gallbladder removed approximately 2 years ago and has noticed that constipation has become a problem for her.  Patient states she takes miralax but does not take it everyday.  RNCM advised patient to look at label on the bottle of the miralax and if instructions states to take   Daily she may want to consider following those guidelines.  RNCM advised patient to discuss her constipation with her doctor as well.  RNCM also advised patient to increase her water intake.   Patient denied need for [facility] care management services at this time. Patient verbally agreed to receive [facility] care management outreach letter and brochure.   PLAN: RNCM will refer patient to case management assistant to close due to refusal of services. RNCM will notify patients primary MD of closure RNCM will mail patient [facility] care management letter/ brochure and EMMI education material regarding constipation.  Quinn Plowman RN,BSN,CCM [facility] Telephonic  [phone] '

## 2016-06-04 DIAGNOSIS — E782 Mixed hyperlipidemia: Secondary | ICD-10-CM | POA: Diagnosis not present

## 2016-06-04 DIAGNOSIS — E038 Other specified hypothyroidism: Secondary | ICD-10-CM | POA: Diagnosis not present

## 2016-06-04 DIAGNOSIS — E559 Vitamin D deficiency, unspecified: Secondary | ICD-10-CM | POA: Diagnosis not present

## 2016-06-04 DIAGNOSIS — I10 Essential (primary) hypertension: Secondary | ICD-10-CM | POA: Diagnosis not present

## 2016-06-11 DIAGNOSIS — F418 Other specified anxiety disorders: Secondary | ICD-10-CM | POA: Diagnosis not present

## 2016-06-11 DIAGNOSIS — E038 Other specified hypothyroidism: Secondary | ICD-10-CM | POA: Diagnosis not present

## 2016-06-11 DIAGNOSIS — R197 Diarrhea, unspecified: Secondary | ICD-10-CM | POA: Diagnosis not present

## 2016-06-11 DIAGNOSIS — G6289 Other specified polyneuropathies: Secondary | ICD-10-CM | POA: Diagnosis not present

## 2016-06-11 DIAGNOSIS — Z0000 Encounter for general adult medical examination without abnormal findings: Secondary | ICD-10-CM | POA: Diagnosis not present

## 2016-06-11 DIAGNOSIS — M810 Age-related osteoporosis without current pathological fracture: Secondary | ICD-10-CM | POA: Diagnosis not present

## 2016-06-11 DIAGNOSIS — R110 Nausea: Secondary | ICD-10-CM | POA: Diagnosis not present

## 2016-06-11 DIAGNOSIS — D6489 Other specified anemias: Secondary | ICD-10-CM | POA: Diagnosis not present

## 2016-06-11 DIAGNOSIS — K6289 Other specified diseases of anus and rectum: Secondary | ICD-10-CM | POA: Diagnosis not present

## 2016-07-07 DIAGNOSIS — Z1231 Encounter for screening mammogram for malignant neoplasm of breast: Secondary | ICD-10-CM | POA: Diagnosis not present

## 2016-07-15 DIAGNOSIS — D649 Anemia, unspecified: Secondary | ICD-10-CM | POA: Diagnosis not present

## 2016-07-22 ENCOUNTER — Ambulatory Visit (INDEPENDENT_AMBULATORY_CARE_PROVIDER_SITE_OTHER): Payer: Medicare HMO | Admitting: Physician Assistant

## 2016-07-22 ENCOUNTER — Encounter: Payer: Self-pay | Admitting: Physician Assistant

## 2016-07-22 ENCOUNTER — Other Ambulatory Visit (INDEPENDENT_AMBULATORY_CARE_PROVIDER_SITE_OTHER): Payer: Medicare HMO

## 2016-07-22 VITALS — BP 120/64 | HR 72 | Ht 64.0 in | Wt 137.0 lb

## 2016-07-22 DIAGNOSIS — D509 Iron deficiency anemia, unspecified: Secondary | ICD-10-CM

## 2016-07-22 DIAGNOSIS — R194 Change in bowel habit: Secondary | ICD-10-CM | POA: Diagnosis not present

## 2016-07-22 LAB — FERRITIN: FERRITIN: 13.9 ng/mL (ref 10.0–291.0)

## 2016-07-22 LAB — IBC PANEL
Iron: 43 ug/dL (ref 42–145)
SATURATION RATIOS: 9.9 % — AB (ref 20.0–50.0)
Transferrin: 309 mg/dL (ref 212.0–360.0)

## 2016-07-22 LAB — HEMOCCULT SLIDES (X 3 CARDS)

## 2016-07-22 NOTE — Progress Notes (Signed)
Agree with assessment and plans 

## 2016-07-22 NOTE — Progress Notes (Signed)
Chief Complaint: IDA, change in bowel habits  HPI:  Sarah Manning is an 83 year old Caucasian female with a past medical history of cholelithiasis, COPD, hypertension and hypothyroidism as well as osteoporosis, who was referred to me by Crist Infante, MD for a complaint of IDA .       Review of outside labs completed 07/15/16 shows a hemoglobin low at 10.7, MCV normal at 96.8, ferritin was low at 14.1. Prior to this patient had her annual physical exam on 06/11/16 and it was discussed that she has chronic anemia for which she is on B12, but this was a bit worse this year so her iron panel and B12 were rechecked. On 07/01/16 hemoglobin was noted at 10.9 with an MCV high at 97.3   Patient has followed with Dr. Henrene Pastor in the past with an EGD on 02/21/15 with the finding of candida esophagitis. Last colonoscopy was performed at [facility] for anemia and hematochezia by Dr. Fuller Plan and was positive for diverticulosis and internal hemorrhoids. Patient also had an EGD at that time which was normal.   Today, the patient's largest complaint is that she has had a change in bowel habits. She notes that she typically has to make 3 trips to the bathroom in the morning. She tells me she will initially have quite a big bowel movement that is formed, but shortly after she feels as though she has to go back again and will have some more stool. She will finally feel like she has to go a third time but typically does not produce any stool. Occasionally these will be loose. This has been occurring over the past couple of months. She has not changed her diet and is currently on MiraLAX when necessary and does use a sprinkle of Benefiber daily.   Patient tells me that she has been anemic "my whole life". She tells me that one point in the pastthey tried to put her on iron tablets but she could not take these orally as it just didn't agree with her. She tells me she is already scheduled for iron infusions 2 over the next  couple of weeks ordered by her PCP. The patient apparently also dropped off a stool study today at her primary care's office.   Patient denies fever, chills, blood in her stool, melena, weight loss, fatigue, anorexia, shortness of breath, dizziness, headaches, nausea, vomiting, heartburn, reflux or abdominal pain.  Past Medical History:  Diagnosis Date  . Arthritis   . Autoimmune disease ([place])    positive ANA, double stranded DNA, anti-RO and anti- LA  . Cholelithiasis   . Colonic adenoma   . COPD (chronic obstructive pulmonary disease) ([place])    non smoker  . Diverticulosis   . Heart murmur   . Hypertension   . Hypothyroidism   . Osteoporosis     Past Surgical History:  Procedure Laterality Date  . CHOLECYSTECTOMY N/A 01/11/2015   Procedure: LAPAROSCOPIC CHOLECYSTECTOMY WITH INTRAOPERATIVE CHOLANGIOGRAM;  Surgeon: Armandina Gemma, MD;  Location: [facility];  Service: General;  Laterality: N/A;    Current Outpatient Prescriptions  Medication Sig Dispense Refill  . acetaminophen (TYLENOL) 325 MG tablet Take 2 tablets (650 mg total) by mouth every 6 (six) hours as needed for mild pain (or temp > 100).    Marland Kitchen atorvastatin (LIPITOR) 10 MG tablet Take 10 mg by mouth daily.    . Cyanocobalamin (VITAMIN B-12) 1000 MCG/15ML LIQD Take 15 mLs by mouth daily.    Marland Kitchen escitalopram (LEXAPRO)  10 MG tablet Take 0.5 tablets by mouth 2 (two) times daily.    Marland Kitchen gabapentin (NEURONTIN) 100 MG capsule Take 100 mg by mouth 2 (two) times daily.    Marland Kitchen lisinopril (PRINIVIL,ZESTRIL) 20 MG tablet Take 20 mg by mouth daily.    Marland Kitchen LORazepam (ATIVAN) 0.5 MG tablet Take 0.25 mg by mouth 2 (two) times daily.     . pantoprazole (PROTONIX) 40 MG tablet One tablet orally qam 30 mins before meals (Patient taking differently: Take 40 mg by mouth daily. One tablet orally qam 30 mins before meals) 30 tablet 1  . Psyllium ([employer] DAILY FIBER PO) Take by mouth.    . SYNTHROID 125 MCG tablet Take 125 mcg by mouth daily.    . Tetrahydrozoline  HCl (EYE DROPS OP) Apply to eye.    Marland Kitchen AMITIZA 8 MCG capsule     . bisacodyl (DULCOLAX) 5 MG EC tablet Take 1 tablet (5 mg total) by mouth 2 (two) times daily. 14 tablet 0  . dicyclomine (BENTYL) 20 MG tablet Take 1 tablet by mouth daily as needed for spasms.     . fluconazole (DIFLUCAN) 100 MG tablet Take 2 100 mg tablets on day one, then 1 tablet daily for 7 days 8 tablet 0  . hydroxypropyl methylcellulose / hypromellose (ISOPTO TEARS / GONIOVISC) 2.5 % ophthalmic solution Place 1 drop into both eyes daily as needed for dry eyes.    . Lidocaine-Hydrocortisone Ace 3-2.5 % KIT Place 1 application rectally 2 (two) times daily. 1 each 0  . ondansetron (ZOFRAN) 4 MG tablet Take 1 tablet (4 mg total) by mouth every 6 (six) hours. (Patient taking differently: Take 4 mg by mouth every 6 (six) hours as needed for nausea or vomiting. ) 4 tablet 0  . saccharomyces boulardii (FLORASTOR) 250 MG capsule Take 1 capsule (250 mg total) by mouth 2 (two) times daily. (Patient taking differently: Take 250-500 mg by mouth 2 (two) times daily. ) 60 capsule 1   No current facility-administered medications for this visit.     Allergies as of 07/22/2016 - Review Complete 07/22/2016  Allergen Reaction Noted  . Aspirin Other (See Comments) 11/06/2014  . Amoxicillin Itching and Rash 11/06/2014  . Penicillins Itching and Rash 11/06/2014    Family History  Problem Relation Age of Onset  . Heart disease Father   . Stomach cancer Brother   . Prostate cancer Brother   . Alzheimer's disease Mother   . Colon cancer Neg Hx     Social History   Social History  . Marital status: Married    Spouse name: Laverna Peace  . Number of children: 2  . Years of education: N/A   Occupational History  . Retired     retired from [employer]  . Smoking status: Never Smoker  . Smokeless tobacco: Never Used  . Alcohol use 0.0 oz/week     Comment: Occ. alcohol  . Drug use: No  . Sexual activity: Not on file     Other Topics Concern  . Not on file   Social History Narrative  . No narrative on file    Review of Systems:    Constitutional: No weight loss, fever or chills Skin: No rash  Cardiovascular: No chest pain Respiratory: No SOB  Gastrointestinal: See HPI and otherwise negative Genitourinary: No dysuria or change in urinary frequency Neurological: No headache, dizziness or syncope Musculoskeletal: No new muscle or joint pain Hematologic: No bleeding Psychiatric:  No history of depression or anxiety   Physical Exam:  Vital signs: BP 120/64   Pulse 72   Ht _0  (1.626 m)   Wt 137 lb (62.1 kg)   BMI 23.52 kg/m   Constitutional:   Pleasant Elderly Caucasian female appears to be in NAD, Well developed, Well nourished, alert and cooperative Head:  Normocephalic and atraumatic. Eyes:   PEERL, EOMI. No icterus. Conjunctiva pink. Ears:  Normal auditory acuity. Neck:  Supple Throat: Oral cavity and pharynx without inflammation, swelling or lesion.  Respiratory: Respirations even and unlabored. Lungs clear to auscultation bilaterally.   No wheezes, crackles, or rhonchi.  Cardiovascular: Normal S1, S2. No MRG. Regular rate and rhythm. No peripheral edema, cyanosis or pallor.  Gastrointestinal:  Soft, nondistended, nontender. No rebound or guarding. Normal bowel sounds. No appreciable masses or hepatomegaly. Rectal:  Not performed.  Msk:  Symmetrical without gross deformities. Without edema, no deformity or joint abnormality.  Neurologic:  Alert and  oriented x4;  grossly normal neurologically.  Skin:   Dry and intact without significant lesions or rashes. Psychiatric: Demonstrates good judgement and reason without abnormal affect or behaviors.  SEE HPI FOR RECENT LABS  Assessment: 1. IDA: Recent labs 07/15/16 showing low ferritin and hemoglobin around 10, see hpi for labs, patient is scheduled for fereheme infusions 2 over the next couple of weeksbecause she cannot stomach oral  iron, or at least has not been able to in the past, patient has had endoscopic evaluation for anemia but not since 2008, does report a change in bowel habits, stool testing recently returned to PCP, likely this is for Hemoccult testing; consider GI source of blood loss versus chronic anemia versus other 2. Change in bowel habits: Patient tells me she has at least 3 bowel movements in the morning now, likely this represents constipation wjhere patient cannot completely evacuate  Plan: 1. Recommend patient increase fiber in her diet 25-35 g daily with use of fiber supplement and/or through her diet. We did discuss this in detail. High-fiber handout was given. 2. Recommend the patient use daily MiraLAX dosing up to 4 times a day or as little as a quarter to a half a dose 3. Ordered labs to include iron studies 4. Will request Hemoccult testing once done from PCP, did tell the patient that if she is positive for blood in her stool this may prompt [place] to further evaluate with possible virtual colonoscopy versus other 5. We did discuss invasive procedures including EGD and colonoscopy for further evaluation and patient declines at this time, she tells me she will have them done if necessary but would like to see how she does with the iron infusions first. 6. Patient will follow with Dr. Henrene Pastor in the next 4-6 weeks or sooner if necessary.  Ellouise Newer, PA-C [facility] 07/22/2016, 3:10 PM  Cc: Crist Infante, MD

## 2016-07-25 ENCOUNTER — Telehealth: Payer: Self-pay

## 2016-07-25 ENCOUNTER — Other Ambulatory Visit (HOSPITAL_COMMUNITY): Payer: Self-pay

## 2016-07-25 NOTE — Telephone Encounter (Signed)
Levin Erp, [place] sent to Barron Alvine, RN        Let pt know there was no blood in her stool at time of recent testing. There is no need for further testing including EGD or Colo at this time. She can follow up as scheduled with Dr. Henrene Pastor. Thanks-JLL    The patient has been notified of this information and all questions answered.

## 2016-07-25 NOTE — Progress Notes (Signed)
07/25/16-1600: Received patient's lab results from 07/22/16, Hemoccult stool testing was negative, iron studies were normal. No change to recommendations.  Ellouise Newer, PA-C

## 2016-07-28 ENCOUNTER — Ambulatory Visit (HOSPITAL_COMMUNITY)
Admission: RE | Admit: 2016-07-28 | Discharge: 2016-07-28 | Disposition: A | Discharge status: Home / Self Care | Payer: Medicare HMO | Source: Ambulatory Visit | Attending: Internal Medicine | Admitting: Internal Medicine

## 2016-07-28 DIAGNOSIS — D509 Iron deficiency anemia, unspecified: Secondary | ICD-10-CM | POA: Diagnosis not present

## 2016-07-28 MED ORDER — SODIUM CHLORIDE 0.9 % IV SOLN
510.0000 mg | INTRAVENOUS | Status: DC
  Administered 2016-07-28: 510 mg via INTRAVENOUS
  Filled 2016-07-28: qty 17

## 2016-07-28 NOTE — Discharge Instructions (Signed)
Ferumoxytol injection What is this medicine? FERUMOXYTOL is an iron complex. Iron is used to make healthy red blood cells, which carry oxygen and nutrients throughout the body. This medicine is used to treat iron deficiency anemia in people with chronic kidney disease. COMMON BRAND NAME(S): Feraheme What should I tell my health care provider before I take this medicine? They need to know if you have any of these conditions: -anemia not caused by low iron levels -high levels of iron in the blood -magnetic resonance imaging (MRI) test scheduled -an unusual or allergic reaction to iron, other medicines, foods, dyes, or preservatives -pregnant or trying to get pregnant -breast-feeding How should I use this medicine? This medicine is for injection into a vein. It is given by a health care professional in a hospital or clinic setting. Talk to your pediatrician regarding the use of this medicine in children. Special care may be needed. What if I miss a dose? It is important not to miss your dose. Call your doctor or health care professional if you are unable to keep an appointment. What may interact with this medicine? This medicine may interact with the following medications: -other iron products What should I watch for while using this medicine? Visit your doctor or healthcare professional regularly. Tell your doctor or healthcare professional if your symptoms do not start to get better or if they get worse. You may need blood work done while you are taking this medicine. You may need to follow a special diet. Talk to your doctor. Foods that contain iron include: whole grains/cereals, dried fruits, beans, or peas, leafy green vegetables, and organ meats (liver, kidney). What side effects may I notice from receiving this medicine? Side effects that you should report to your doctor or health care professional as soon as possible: -allergic reactions like skin rash, itching or hives, swelling of the  face, lips, or tongue -breathing problems -changes in blood pressure -feeling faint or lightheaded, falls -fever or chills -flushing, sweating, or hot feelings -swelling of the ankles or feet Side effects that usually do not require medical attention (report to your doctor or health care professional if they continue or are bothersome): -diarrhea -headache -nausea, vomiting -stomach pain Where should I keep my medicine? This drug is given in a hospital or clinic and will not be stored at home.  2017 Elsevier/Gold Standard (2015-06-21 12:41:49)  

## 2016-08-04 ENCOUNTER — Encounter (HOSPITAL_COMMUNITY)
Admission: RE | Admit: 2016-08-04 | Discharge: 2016-08-04 | Disposition: A | Discharge status: Home / Self Care | Payer: Medicare HMO | Source: Ambulatory Visit | Attending: Internal Medicine | Admitting: Internal Medicine

## 2016-08-04 DIAGNOSIS — D649 Anemia, unspecified: Secondary | ICD-10-CM | POA: Diagnosis not present

## 2016-08-04 MED ORDER — SODIUM CHLORIDE 0.9 % IV SOLN
510.0000 mg | INTRAVENOUS | Status: DC
  Administered 2016-08-04: 11:00:00 510 mg via INTRAVENOUS
  Filled 2016-08-04: qty 17

## 2016-08-12 DIAGNOSIS — L821 Other seborrheic keratosis: Secondary | ICD-10-CM | POA: Diagnosis not present

## 2016-08-12 DIAGNOSIS — Z1283 Encounter for screening for malignant neoplasm of skin: Secondary | ICD-10-CM | POA: Diagnosis not present

## 2016-09-02 DIAGNOSIS — H43813 Vitreous degeneration, bilateral: Secondary | ICD-10-CM | POA: Diagnosis not present

## 2016-09-02 DIAGNOSIS — H04123 Dry eye syndrome of bilateral lacrimal glands: Secondary | ICD-10-CM | POA: Diagnosis not present

## 2016-09-02 DIAGNOSIS — H25813 Combined forms of age-related cataract, bilateral: Secondary | ICD-10-CM | POA: Diagnosis not present

## 2016-09-04 DIAGNOSIS — R69 Illness, unspecified: Secondary | ICD-10-CM | POA: Diagnosis not present

## 2016-09-09 ENCOUNTER — Ambulatory Visit: Payer: Medicare HMO | Admitting: Internal Medicine

## 2016-09-19 ENCOUNTER — Other Ambulatory Visit (INDEPENDENT_AMBULATORY_CARE_PROVIDER_SITE_OTHER): Payer: Medicare HMO

## 2016-09-19 ENCOUNTER — Ambulatory Visit (INDEPENDENT_AMBULATORY_CARE_PROVIDER_SITE_OTHER): Payer: Medicare HMO | Admitting: Internal Medicine

## 2016-09-19 ENCOUNTER — Encounter: Payer: Self-pay | Admitting: Internal Medicine

## 2016-09-19 VITALS — BP 118/70 | HR 84 | Ht 64.0 in | Wt 138.2 lb

## 2016-09-19 DIAGNOSIS — D509 Iron deficiency anemia, unspecified: Secondary | ICD-10-CM

## 2016-09-19 DIAGNOSIS — K5901 Slow transit constipation: Secondary | ICD-10-CM | POA: Diagnosis not present

## 2016-09-19 LAB — CBC WITH DIFFERENTIAL/PLATELET
BASOS ABS: 0 10*3/uL (ref 0.0–0.1)
BASOS PCT: 0.2 % (ref 0.0–3.0)
EOS ABS: 0 10*3/uL (ref 0.0–0.7)
Eosinophils Relative: 0.4 % (ref 0.0–5.0)
HEMATOCRIT: 35.4 % — AB (ref 36.0–46.0)
HEMOGLOBIN: 12 g/dL (ref 12.0–15.0)
LYMPHS PCT: 33.9 % (ref 12.0–46.0)
Lymphs Abs: 1.9 10*3/uL (ref 0.7–4.0)
MCHC: 33.9 g/dL (ref 30.0–36.0)
MCV: 93.2 fl (ref 78.0–100.0)
MONO ABS: 0.5 10*3/uL (ref 0.1–1.0)
Monocytes Relative: 8.5 % (ref 3.0–12.0)
Neutro Abs: 3.1 10*3/uL (ref 1.4–7.7)
Neutrophils Relative %: 57 % (ref 43.0–77.0)
Platelets: 260 10*3/uL (ref 150.0–400.0)
RBC: 3.79 Mil/uL — ABNORMAL LOW (ref 3.87–5.11)
RDW: 14.4 % (ref 11.5–15.5)
WBC: 5.5 10*3/uL (ref 4.0–10.5)

## 2016-09-19 LAB — FERRITIN: FERRITIN: 359.7 ng/mL — AB (ref 10.0–291.0)

## 2016-09-19 NOTE — Patient Instructions (Signed)
Your physician has requested that you go to the basement for the following lab work before leaving today:  CBC, Ferritin  

## 2016-09-23 ENCOUNTER — Encounter: Payer: Self-pay | Admitting: Internal Medicine

## 2016-09-23 NOTE — Progress Notes (Signed)
HISTORY OF PRESENT ILLNESS:  Sarah Manning is a 83 y.o. female with past medical history as listed below who was evaluated in this office 07/22/2016 by the GI physician assistant regarding iron deficiency anemia. See that dictation for details. Patient has subsequently received 2 iron infusion therapy sessions. At the time of her last evaluation informed regarding the utility of colonoscopy and upper endoscopy to evaluate iron deficiency anemia. She declined procedural work at that time. She did however agree to submit Hemoccult studies and follow-up at this time. Her ferritin level at the time of her last evaluation was 13.9. Hemoccult studies were negative 6. She has been on regular PPI therapy. Last upper endoscopy in 2016 revealed Candida esophagitis for which she was treated. Her last colonoscopy was performed by Dr. Fuller Manning in 2008. This was normal except for sigmoid diverticulosis and internal hemorrhoids. The examination was performed at that time for anemia and hematochezia. Patient reports that she is feeling well. She does have chronic stable constipation which responds to MiraLAX. She has been hesitant to use this regularly even though it is effective and is tolerated well. No other complaints in the GI review of systems.  REVIEW OF SYSTEMS:  All non-GI ROS negative except for arthritis, ankle swelling  Past Medical History:  Diagnosis Date  . Arthritis   . Autoimmune disease ([place])    positive ANA, double stranded DNA, anti-RO and anti- LA  . Cholelithiasis   . Colonic adenoma   . COPD (chronic obstructive pulmonary disease) ([place])    non smoker  . Diverticulosis   . Heart murmur   . Hypertension   . Hypothyroidism   . Osteoporosis     Past Surgical History:  Procedure Laterality Date  . CHOLECYSTECTOMY N/A 01/11/2015   Procedure: LAPAROSCOPIC CHOLECYSTECTOMY WITH INTRAOPERATIVE CHOLANGIOGRAM;  Surgeon: Sarah Gemma, MD;  Location: [facility];  Service: General;  Laterality: N/A;     Social History Sarah Manning  reports that she has never smoked. She has never used smokeless tobacco. She reports that she drinks alcohol. She reports that she does not use drugs.  family history includes Alzheimer's disease in her mother; Heart disease in her father; Prostate cancer in her brother; Stomach cancer in her brother.  Allergies  Allergen Reactions  . Aspirin Other (See Comments)    Excessive blood thinning  . Amoxicillin Itching and Rash  . Penicillins Itching and Rash       PHYSICAL EXAMINATION: Vital signs: BP 118/70 (BP Location: Left Arm, Patient Position: Sitting, Cuff Size: Normal)   Pulse 84   Ht 5\' 4"  (1.626 m)   Wt 138 lb 4 oz (62.7 kg)   BMI 23.73 kg/m   Constitutional: generally well-appearing, no acute distress Psychiatric: alert and oriented x3, cooperative Eyes: extraocular movements intact, anicteric, conjunctiva pink Mouth: oral pharynx moist, no lesions Neck: supple no lymphadenopathy Cardiovascular: heart regular rate and rhythm, no murmur Lungs: clear to auscultation bilaterally Abdomen: soft, nontender, nondistended, no obvious ascites, no peritoneal signs, normal bowel sounds, no organomegaly Rectal:Omitted Extremities: no clubbing cyanosis or lower extremity edema bilaterally Skin: no lesions on visible extremities Neuro: No focal deficits. Cranial nerves intact  ASSESSMENT:  #1. Iron deficiency anemia without evidence for overt or occult GI blood loss. #2. Functional constipation. Responsive to MiraLAX #3. History of candida esophagitis on EGD #4. History of diverticulosis on colonoscopy 2008 to evaluate anemia and hematochezia #5. Gen. medical problems under the care of Dr. Joylene Manning   Manning:  #1. We  discussed the value of endoscopic evaluations including but not limited to colonoscopy to rule out neoplasia or AVMs and upper endoscopy for the same or other mucosal lesions. With careful consideration patient has respectfully  declined further workup with procedure stating her age and the fact that she "feels good". She does understand that she could change her mind in this regard for clinical or other reasons #2. Repeat CBC and ferritin today to assess response to iron infusion therapy #4. Continue empiric PPI for GI mucosal protection #5. Ongoing general medical care with Dr. Joylene Manning. GI follow-up as needed #6. MiraLAX for constipation. I explained to the patient the proper way to titrate MiraLAX to achieve the desired result. She understands  25 minutes spent face-to-face with the patient. Greater than 50% a time use for counseling regarding iron deficiency anemia, its differential diagnosis, workup, and specific plans moving forward in her case. Also management of constipation  ADDENDUM The patient's hemoglobin returned normal at 12. Supernormal ferritin of 359. Patient has been informed. Return to the care of Dr. Joylene Manning who can monitor her blood counts

## 2016-10-17 DIAGNOSIS — Z6823 Body mass index (BMI) 23.0-23.9, adult: Secondary | ICD-10-CM | POA: Diagnosis not present

## 2016-10-17 DIAGNOSIS — M25552 Pain in left hip: Secondary | ICD-10-CM | POA: Diagnosis not present

## 2016-10-24 ENCOUNTER — Ambulatory Visit (INDEPENDENT_AMBULATORY_CARE_PROVIDER_SITE_OTHER): Payer: Medicare HMO

## 2016-10-24 ENCOUNTER — Encounter (INDEPENDENT_AMBULATORY_CARE_PROVIDER_SITE_OTHER): Payer: Self-pay | Admitting: Orthopaedic Surgery

## 2016-10-24 ENCOUNTER — Ambulatory Visit (INDEPENDENT_AMBULATORY_CARE_PROVIDER_SITE_OTHER): Payer: Medicare HMO | Admitting: Orthopaedic Surgery

## 2016-10-24 DIAGNOSIS — M1612 Unilateral primary osteoarthritis, left hip: Secondary | ICD-10-CM | POA: Diagnosis not present

## 2016-10-24 MED ORDER — BUPIVACAINE HCL 0.5 % IJ SOLN
3.0000 mL | INTRAMUSCULAR | Status: AC | PRN
  Administered 2016-10-24: 3 mL via INTRA_ARTICULAR

## 2016-10-24 MED ORDER — TRIAMCINOLONE ACETONIDE 40 MG/ML IJ SUSP
80.0000 mg | INTRAMUSCULAR | Status: AC | PRN
  Administered 2016-10-24: 80 mg via INTRA_ARTICULAR

## 2016-10-24 NOTE — Progress Notes (Signed)
Sarah Manning - 83 y.o. female MRN [id number]  Date of birth: April 29, 1925  Office Visit Note: Visit Date: 10/24/2016 PCP: Crist Infante, MD Referred by: Crist Infante, MD  Subjective: Chief Complaint  Patient presents with  . Left Hip - Pain   HPI: Sarah Manning is very pleasant 83 year old female that saw Dr. Erlinda Hong today for her left hip. He requested diagnostic and therapeutic left anesthetic hip arthrogram. We did perform that today.    ROS Otherwise per HPI.  Assessment & Plan: Visit Diagnoses:  1. Primary osteoarthritis of left hip     Plan: Findings:  Diagnostic and therapeutic right hip anesthetic arthrogram. Patient did have decent relief during the anesthetic phase.    Meds & Orders: No orders of the defined types were placed in this encounter.   Orders Placed This Encounter  Procedures  . Large Joint Injection/Arthrocentesis  . XR HIP UNILAT W OR W/O PELVIS 2-3 VIEWS LEFT  . XR C-ARM NO REPORT    Follow-up: Return if symptoms worsen or fail to improve.   Procedures: Hip anesthetic arthrogram Date/Time: 10/24/2016 11:43 AM Performed by: Magnus Sinning Authorized by: Magnus Sinning   Consent Given by:  Patient Site marked: the procedure site was marked   Timeout: prior to procedure the correct patient, procedure, and site was verified   Indications:  Pain and diagnostic evaluation Location:  Hip Site:  L hip joint Prep: patient was prepped and draped in usual sterile fashion   Needle Size:  22 G Approach:  Anterior Ultrasound Guidance: No   Fluoroscopic Guidance: No   Arthrogram: Yes   Medications:  3 mL bupivacaine 0.5 %; 80 mg triamcinolone acetonide 40 MG/ML Aspiration Attempted: Yes   Patient tolerance:  Patient tolerated the procedure well with no immediate complications  Arthrogram demonstrated excellent flow of contrast throughout the joint surface without extravasation or obvious defect.  The patient had relief of symptoms during the anesthetic  phase of the injection.      No notes on file   Clinical History: No specialty comments available.  She reports that she has never smoked. She has never used smokeless tobacco. No results for input(s): HGBA1C, LABURIC in the last 8760 hours.  Objective:  VS:  HT:    WT:   BMI:     BP:   HR: bpm  TEMP: ( )  RESP:  Physical Exam  Musculoskeletal:  Patient relates with a cane. She does have pain with rotation of the left hip.    Ortho Exam Imaging: No results found.  Past Medical/Family/Surgical/Social History: Medications & Allergies reviewed per EMR Patient Active Problem List   Diagnosis Date Noted  . Primary osteoarthritis of left hip 10/24/2016  . Chronic cholecystitis with calculus 01/11/2015  . Symptomatic cholelithiasis 01/10/2015  . Diverticulosis of colon without hemorrhage 12/26/2014  . Hyperlipidemia 12/26/2014  . Hypothyroidism 12/26/2014   Past Medical History:  Diagnosis Date  . Arthritis   . Autoimmune disease ([place])    positive ANA, double stranded DNA, anti-RO and anti- LA  . Cholelithiasis   . Colonic adenoma   . COPD (chronic obstructive pulmonary disease) ([place])    non smoker  . Diverticulosis   . Heart murmur   . Hypertension   . Hypothyroidism   . Osteoporosis    Family History  Problem Relation Age of Onset  . Heart disease Father   . Stomach cancer Brother   . Prostate cancer Brother   . Alzheimer's disease Mother   .  Colon cancer Neg Hx    Past Surgical History:  Procedure Laterality Date  . CHOLECYSTECTOMY N/A 01/11/2015   Procedure: LAPAROSCOPIC CHOLECYSTECTOMY WITH INTRAOPERATIVE CHOLANGIOGRAM;  Surgeon: Armandina Gemma, MD;  Location: [facility];  Service: General;  Laterality: N/A;   Social History   Occupational History  . Retired     retired from [employer]  . Smoking status: Never Smoker  . Smokeless tobacco: Never Used  . Alcohol use 0.0 oz/week     Comment: Occ. alcohol  . Drug use: No  . Sexual  activity: Not on file

## 2016-10-24 NOTE — Progress Notes (Signed)
Office Visit Note   Patient: Sarah Manning           Date of Birth: [birth year]           MRN: [id number] Visit Date: 10/24/2016              Requested by: Crist Infante, MD [address] [place], [place] [address] PCP: Crist Infante, MD   Assessment & Plan: Visit Diagnoses:  1. Primary osteoarthritis of left hip     Plan: Impression is left hip osteoarthritis. I did refer her to Dr. Ernestina Patches for intra-articular steroid injection. Follow up with me as needed.  Follow-Up Instructions: Return if symptoms worsen or fail to improve.   Orders:  Orders Placed This Encounter  Procedures  . XR HIP UNILAT W OR W/O PELVIS 2-3 VIEWS LEFT   No orders of the defined types were placed in this encounter.     Procedures: No procedures performed   Clinical Data: No additional findings.   Subjective: Chief Complaint  Patient presents with  . Left Hip - Pain    Patient comes in with left hip and groin pain for several months. This affects her when weightbearing and activity. The pain radiates down into her knee occasionally. Denies any back pain or radicular symptoms. Denies any numbness.    Review of Systems  Constitutional: Negative.   HENT: Negative.   Eyes: Negative.   Respiratory: Negative.   Cardiovascular: Negative.   Endocrine: Negative.   Musculoskeletal: Negative.   Neurological: Negative.   Hematological: Negative.   Psychiatric/Behavioral: Negative.   All other systems reviewed and are negative.    Objective: Vital Signs: There were no vitals taken for this visit.  Physical Exam  Constitutional: She is oriented to person, place, and time. She appears well-developed and well-nourished.  HENT:  Head: Normocephalic and atraumatic.  Eyes: EOM are normal.  Neck: Neck supple.  Pulmonary/Chest: Effort normal.  Abdominal: Soft.  Neurological: She is alert and oriented to person, place, and time.  Skin: Skin is warm. Capillary refill takes less than 2 seconds.   Psychiatric: She has a normal mood and affect. Her behavior is normal. Judgment and thought content normal.  Nursing note and vitals reviewed.   Ortho Exam Left hip exam shows pain with rotation with positive Stinchfield test. No radicular signs. Lateral hip is nontender. Specialty Comments:  No specialty comments available.  Imaging: No results found.   PMFS History: Patient Active Problem List   Diagnosis Date Noted  . Primary osteoarthritis of left hip 10/24/2016  . Chronic cholecystitis with calculus 01/11/2015  . Symptomatic cholelithiasis 01/10/2015  . Diverticulosis of colon without hemorrhage 12/26/2014  . Hyperlipidemia 12/26/2014  . Hypothyroidism 12/26/2014   Past Medical History:  Diagnosis Date  . Arthritis   . Autoimmune disease ([place])    positive ANA, double stranded DNA, anti-RO and anti- LA  . Cholelithiasis   . Colonic adenoma   . COPD (chronic obstructive pulmonary disease) ([place])    non smoker  . Diverticulosis   . Heart murmur   . Hypertension   . Hypothyroidism   . Osteoporosis     Family History  Problem Relation Age of Onset  . Heart disease Father   . Stomach cancer Brother   . Prostate cancer Brother   . Alzheimer's disease Mother   . Colon cancer Neg Hx     Past Surgical History:  Procedure Laterality Date  . CHOLECYSTECTOMY N/A 01/11/2015   Procedure: LAPAROSCOPIC CHOLECYSTECTOMY WITH  INTRAOPERATIVE CHOLANGIOGRAM;  Surgeon: Armandina Gemma, MD;  Location: [facility];  Service: General;  Laterality: N/A;   Social History   Occupational History  . Retired     retired from [employer]  . Smoking status: Never Smoker  . Smokeless tobacco: Never Used  . Alcohol use 0.0 oz/week     Comment: Occ. alcohol  . Drug use: No  . Sexual activity: Not on file

## 2016-10-24 NOTE — Addendum Note (Signed)
Addended by: Raymondo Band on: 10/24/2016 11:59 AM   Modules accepted: Orders

## 2016-12-22 DIAGNOSIS — E038 Other specified hypothyroidism: Secondary | ICD-10-CM | POA: Diagnosis not present

## 2016-12-22 DIAGNOSIS — Z6823 Body mass index (BMI) 23.0-23.9, adult: Secondary | ICD-10-CM | POA: Diagnosis not present

## 2016-12-22 DIAGNOSIS — K5909 Other constipation: Secondary | ICD-10-CM | POA: Diagnosis not present

## 2016-12-22 DIAGNOSIS — M25552 Pain in left hip: Secondary | ICD-10-CM | POA: Diagnosis not present

## 2016-12-22 DIAGNOSIS — I10 Essential (primary) hypertension: Secondary | ICD-10-CM | POA: Diagnosis not present

## 2016-12-22 DIAGNOSIS — D649 Anemia, unspecified: Secondary | ICD-10-CM | POA: Diagnosis not present

## 2017-01-14 ENCOUNTER — Telehealth (INDEPENDENT_AMBULATORY_CARE_PROVIDER_SITE_OTHER): Payer: Self-pay | Admitting: Orthopaedic Surgery

## 2017-01-14 DIAGNOSIS — M25552 Pain in left hip: Secondary | ICD-10-CM

## 2017-01-14 NOTE — Telephone Encounter (Signed)
Last seen 10/24/16. Injection given by Ernestina Patches 10/24/16 as well. Could you advise for Dr Erlinda Hong please. Thank you so much for your help.

## 2017-01-14 NOTE — Telephone Encounter (Signed)
Order is made. Patient is aware and they will call her to schedule appt. Pending appt for hip inj

## 2017-01-14 NOTE — Telephone Encounter (Signed)
PT ASKED IF SHE CAN HAVE SOMETHING FOR PAIN OR IF SHE NEEDS TO COME BACK IN.  PLEASE ADVISE [phone] CAN LEAVE VM

## 2017-01-19 ENCOUNTER — Encounter (INDEPENDENT_AMBULATORY_CARE_PROVIDER_SITE_OTHER): Payer: Self-pay | Admitting: Physical Medicine and Rehabilitation

## 2017-01-19 ENCOUNTER — Ambulatory Visit (INDEPENDENT_AMBULATORY_CARE_PROVIDER_SITE_OTHER): Payer: Medicare HMO

## 2017-01-19 ENCOUNTER — Ambulatory Visit (INDEPENDENT_AMBULATORY_CARE_PROVIDER_SITE_OTHER): Payer: Medicare HMO | Admitting: Physical Medicine and Rehabilitation

## 2017-01-19 DIAGNOSIS — M5416 Radiculopathy, lumbar region: Secondary | ICD-10-CM

## 2017-01-19 DIAGNOSIS — M25552 Pain in left hip: Secondary | ICD-10-CM | POA: Diagnosis not present

## 2017-01-19 DIAGNOSIS — M1612 Unilateral primary osteoarthritis, left hip: Secondary | ICD-10-CM

## 2017-01-19 NOTE — Progress Notes (Signed)
Patient is here for left hip injection.  She states that her PCP started her on Gabapentin and she has some questions about it concerning her leg weakness.

## 2017-01-19 NOTE — Progress Notes (Addendum)
Sarah Manning - 83 y.o. female MRN [id number]  Date of birth: 01-25-1925  Office Visit Note: Visit Date: 01/19/2017 PCP: Crist Infante, MD Referred by: Crist Infante, MD  Subjective: Chief Complaint  Patient presents with  . Left Hip - Injections   HPI: Sarah Manning is an 83 year old female that we saw at the end of May and completed intra-articular injection of the left hip. She did well with that up until recently. She got about 2 months of relief. We're going to repeat the injection today. She is having mostly posterior hip pain without much groin pain. Pain is worse with ambulation and movement. She states that her primary care physician Sarah Manning has her on gabapentin and she wonders if that is making her legs weak. At first I felt like she was saying she was just started on it but she actually tells me now that she's been on it for quite a while. I don't think the gabapentin is causing her leg weakness. She takes one or milligrams twice a day. She's not really had a workup of her spine to look for stenosis.    Review of Systems  Constitutional: Negative for chills, fever, malaise/fatigue and weight loss.  HENT: Negative for hearing loss and sinus pain.   Eyes: Negative for blurred vision, double vision and photophobia.  Respiratory: Negative for cough and shortness of breath.   Cardiovascular: Negative for chest pain, palpitations and leg swelling.  Gastrointestinal: Negative for abdominal pain, nausea and vomiting.  Genitourinary: Negative for flank pain.  Musculoskeletal: Positive for back pain and joint pain. Negative for myalgias.  Skin: Negative for itching and rash.  Neurological: Negative for tremors, focal weakness and weakness.  Endo/Heme/Allergies: Negative.   Psychiatric/Behavioral: Negative for depression.  All other systems reviewed and are negative.  Otherwise per HPI.  Assessment & Plan: Visit Diagnoses:  1. Pain in left hip   2. Primary osteoarthritis of left  hip   3. Lumbar radiculopathy     Plan: Findings:  Left hip intra-articular injection fluoroscopic guidance. The patient had pretty good relief in anesthetic phase of the injection. Dr. Erlinda Hong should consider lumbar spine workup for stenosis if symptoms are not sufficiently relieved. She does have bad left hip arthritis however. Some of her symptoms could be neurogenic claudication. I think she should continue the gabapentin and she can ask her primary care physician possibly about increasing the doses of decreasing the dose depending on her tolerance.    Meds & Orders:  Meds ordered this encounter  Medications  . bupivacaine (MARCAINE) 0.5 % (with pres) injection 3 mL  . triamcinolone acetonide (KENALOG-40) injection 80 mg    Orders Placed This Encounter  Procedures  . Large Joint Injection/Arthrocentesis  . XR C-ARM NO REPORT    Follow-up: Return if symptoms worsen or fail to improve, for Dr. Erlinda Hong - consider lumbar stenosis.   Procedures: Large Joint Inj Date/Time: 01/19/2017 9:26 AM Performed by: Magnus Sinning Authorized by: Magnus Sinning   Consent Given by:  Patient Site marked: the procedure site was marked   Timeout: prior to procedure the correct patient, procedure, and site was verified   Indications:  Pain and diagnostic evaluation Location:  Hip Site:  L hip joint Prep: patient was prepped and draped in usual sterile fashion   Needle Size:  22 G Needle Length:  3.5 inches Approach:  Anterior Ultrasound Guidance: No   Fluoroscopic Guidance: Yes   Arthrogram: No   Medications:  80 mg triamcinolone  acetonide 40 MG/ML; 3 mL bupivacaine 0.5 % Aspiration Attempted: Yes   Patient tolerance:  Patient tolerated the procedure well with no immediate complications  There was excellent flow of contrast producing a partial arthrogram of the hip. The patient did have relief of symptoms during the anesthetic phase of the injection.    No notes on file   Clinical History: No  specialty comments available.  She reports that she has never smoked. She has never used smokeless tobacco. No results for input(s): HGBA1C, LABURIC in the last 8760 hours.  Objective:  VS:  HT:    WT:   BMI:     BP:   HR: bpm  TEMP: ( )  RESP:  Physical Exam  Constitutional: She is oriented to person, place, and time. She appears well-developed and well-nourished.  Eyes: Pupils are equal, round, and reactive to light. Conjunctivae and EOM are normal.  Cardiovascular: Normal rate and intact distal pulses.   Pulmonary/Chest: Effort normal.  Abdominal: Soft. She exhibits no distension.  Musculoskeletal:  Patient ambulates with an antalgic gait to the left using a cane. She does have pain with hip rotation.  Neurological: She is alert and oriented to person, place, and time. She exhibits normal muscle tone.  Skin: Skin is warm and dry. No rash noted. No erythema.  Psychiatric: She has a normal mood and affect. Her behavior is normal.  Nursing note and vitals reviewed.   Ortho Exam Imaging: Xr C-arm No Report  Result Date: 01/19/2017 Please see Notes or Procedures tab for imaging impression.   Past Medical/Family/Surgical/Social History: Medications & Allergies reviewed per EMR Patient Active Problem List   Diagnosis Date Noted  . Primary osteoarthritis of left hip 10/24/2016  . Chronic cholecystitis with calculus 01/11/2015  . Symptomatic cholelithiasis 01/10/2015  . Diverticulosis of colon without hemorrhage 12/26/2014  . Hyperlipidemia 12/26/2014  . Hypothyroidism 12/26/2014   Past Medical History:  Diagnosis Date  . Arthritis   . Autoimmune disease ([place])    positive ANA, double stranded DNA, anti-RO and anti- LA  . Cholelithiasis   . Colonic adenoma   . COPD (chronic obstructive pulmonary disease) ([place])    non smoker  . Diverticulosis   . Heart murmur   . Hypertension   . Hypothyroidism   . Osteoporosis    Family History  Problem Relation Age of Onset  .  Heart disease Father   . Stomach cancer Brother   . Prostate cancer Brother   . Alzheimer's disease Mother   . Colon cancer Neg Hx    Past Surgical History:  Procedure Laterality Date  . CHOLECYSTECTOMY N/A 01/11/2015   Procedure: LAPAROSCOPIC CHOLECYSTECTOMY WITH INTRAOPERATIVE CHOLANGIOGRAM;  Surgeon: Armandina Gemma, MD;  Location: [facility];  Service: General;  Laterality: N/A;   Social History   Occupational History  . Retired     retired from [employer]  . Smoking status: Never Smoker  . Smokeless tobacco: Never Used  . Alcohol use 0.0 oz/week     Comment: Occ. alcohol  . Drug use: No  . Sexual activity: Not on file

## 2017-01-19 NOTE — Patient Instructions (Signed)

## 2017-01-20 MED ORDER — BUPIVACAINE HCL 0.5 % IJ SOLN
3.0000 mL | INTRAMUSCULAR | Status: AC | PRN
  Administered 2017-01-19: 3 mL via INTRA_ARTICULAR

## 2017-01-20 MED ORDER — TRIAMCINOLONE ACETONIDE 40 MG/ML IJ SUSP
80.0000 mg | INTRAMUSCULAR | Status: AC | PRN
  Administered 2017-01-19: 80 mg via INTRA_ARTICULAR

## 2017-02-03 DIAGNOSIS — L02821 Furuncle of head [any part, except face]: Secondary | ICD-10-CM | POA: Diagnosis not present

## 2017-02-03 DIAGNOSIS — L821 Other seborrheic keratosis: Secondary | ICD-10-CM | POA: Diagnosis not present

## 2017-02-03 DIAGNOSIS — B9689 Other specified bacterial agents as the cause of diseases classified elsewhere: Secondary | ICD-10-CM | POA: Diagnosis not present

## 2017-02-03 DIAGNOSIS — T148XXA Other injury of unspecified body region, initial encounter: Secondary | ICD-10-CM | POA: Diagnosis not present

## 2017-02-03 DIAGNOSIS — D225 Melanocytic nevi of trunk: Secondary | ICD-10-CM | POA: Diagnosis not present

## 2017-02-28 DIAGNOSIS — Z23 Encounter for immunization: Secondary | ICD-10-CM | POA: Diagnosis not present

## 2017-03-10 DIAGNOSIS — R69 Illness, unspecified: Secondary | ICD-10-CM | POA: Diagnosis not present

## 2017-03-11 DIAGNOSIS — E038 Other specified hypothyroidism: Secondary | ICD-10-CM | POA: Diagnosis not present

## 2017-04-02 ENCOUNTER — Telehealth (INDEPENDENT_AMBULATORY_CARE_PROVIDER_SITE_OTHER): Payer: Self-pay | Admitting: Physical Medicine and Rehabilitation

## 2017-04-02 NOTE — Telephone Encounter (Signed)
If the hip injection helped greatly but her symptoms have just returned and then she probably would see Dr. Erlinda Hong.  If she is concerned about it being more of a lumbar spine issue that she could talk to him more we could look at that as well.

## 2017-04-03 NOTE — Telephone Encounter (Signed)
I called the patient and scheduled her a follow up with Dr. Erlinda Hong. The injection really helped, but her pain has returned.

## 2017-04-07 ENCOUNTER — Encounter (INDEPENDENT_AMBULATORY_CARE_PROVIDER_SITE_OTHER): Payer: Self-pay | Admitting: Orthopaedic Surgery

## 2017-04-07 ENCOUNTER — Ambulatory Visit (INDEPENDENT_AMBULATORY_CARE_PROVIDER_SITE_OTHER): Payer: Medicare HMO | Admitting: Orthopaedic Surgery

## 2017-04-07 DIAGNOSIS — M1612 Unilateral primary osteoarthritis, left hip: Secondary | ICD-10-CM

## 2017-04-07 MED ORDER — TRAMADOL HCL 50 MG PO TABS: 50.0000 mg | ORAL_TABLET | Freq: Three times a day (TID) | ORAL | 2 refills | Status: DC | PRN

## 2017-04-07 NOTE — Progress Notes (Signed)
   Office Visit Note   Patient: Sarah Manning           Date of Birth: 12-23-1924           MRN: [id number] Visit Date: 04/07/2017              Requested by: Crist Infante, MD [address] [place], [place] [address] PCP: Crist Infante, MD   Assessment & Plan: Visit Diagnoses:  1. Primary osteoarthritis of left hip     Plan: Impression is left hip osteoarthritis.  We will refer her back to Dr. Ernestina Patches for another injection.  Patient is 83 years old and is not a good surgical candidate.  Prescription for tramadol.  Follow-up as needed.  Follow-Up Instructions: Return if symptoms worsen or fail to improve.   Orders:  Orders Placed This Encounter  Procedures  . Ambulatory referral to Physical Medicine Rehab   Meds ordered this encounter  Medications  . traMADol (ULTRAM) 50 MG tablet    Sig: Take 1-2 tablets (50-100 mg total) 3 (three) times daily as needed by mouth.    Dispense:  30 tablet    Refill:  2      Procedures: No procedures performed   Clinical Data: No additional findings.   Subjective: Chief Complaint  Patient presents with  . Left Hip - Pain    Patient follows up today for her hip arthritis.  She did get 2-1/2 months of relief from the previous injection.  She would like something stronger for the pain.  Denies any other changes or complaints.    Review of Systems   Objective: Vital Signs: There were no vitals taken for this visit.  Physical Exam  Ortho Exam Left hip exam is stable. Specialty Comments:  No specialty comments available.  Imaging: No results found.   PMFS History: Patient Active Problem List   Diagnosis Date Noted  . Primary osteoarthritis of left hip 10/24/2016  . Chronic cholecystitis with calculus 01/11/2015  . Symptomatic cholelithiasis 01/10/2015  . Diverticulosis of colon without hemorrhage 12/26/2014  . Hyperlipidemia 12/26/2014  . Hypothyroidism 12/26/2014   Past Medical History:  Diagnosis Date  .  Arthritis   . Autoimmune disease ([facility])    positive ANA, double stranded DNA, anti-RO and anti- LA  . Cholelithiasis   . Colonic adenoma   . COPD (chronic obstructive pulmonary disease) ([facility])    non smoker  . Diverticulosis   . Heart murmur   . Hypertension   . Hypothyroidism   . Osteoporosis     Family History  Problem Relation Age of Onset  . Heart disease Father   . Stomach cancer Brother   . Prostate cancer Brother   . Alzheimer's disease Mother   . Colon cancer Neg Hx     History reviewed. No pertinent surgical history. Social History   Occupational History  . Occupation: Retired    Comment: retired from [employer]  . Smoking status: Never Smoker  . Smokeless tobacco: Never Used  Substance and Sexual Activity  . Alcohol use: Yes    Alcohol/week: 0.0 oz    Comment: Occ. alcohol  . Drug use: No  . Sexual activity: Not on file

## 2017-04-09 ENCOUNTER — Ambulatory Visit (INDEPENDENT_AMBULATORY_CARE_PROVIDER_SITE_OTHER): Payer: Medicare HMO | Admitting: Physical Medicine and Rehabilitation

## 2017-04-09 ENCOUNTER — Ambulatory Visit (INDEPENDENT_AMBULATORY_CARE_PROVIDER_SITE_OTHER): Payer: Self-pay

## 2017-04-09 DIAGNOSIS — M25551 Pain in right hip: Secondary | ICD-10-CM | POA: Diagnosis not present

## 2017-04-09 DIAGNOSIS — M1612 Unilateral primary osteoarthritis, left hip: Secondary | ICD-10-CM

## 2017-04-09 MED ORDER — TRIAMCINOLONE ACETONIDE 40 MG/ML IJ SUSP
80.0000 mg | INTRAMUSCULAR | Status: AC | PRN
  Administered 2017-04-09: 80 mg via INTRA_ARTICULAR

## 2017-04-09 MED ORDER — BUPIVACAINE HCL 0.5 % IJ SOLN
3.0000 mL | INTRAMUSCULAR | Status: AC | PRN
  Administered 2017-04-09: 3 mL via INTRA_ARTICULAR

## 2017-04-09 NOTE — Progress Notes (Signed)
Sarah Manning - 83 y.o. female MRN [id number]  Date of birth: 03/31/1925  Office Visit Note: Visit Date: 04/09/2017 PCP: Crist Infante, MD Referred by: Crist Infante, MD  Subjective: Chief Complaint  Patient presents with  . Left Hip - Pain   HPI: Sarah Manning is a young appearing 83 year old we have completed a couple of injections in the left hip with good relief of her symptoms for 2-3 months.  She has followed up with Dr. Wyline Copas.  Requested another injection for her.  It is something she could have off and on watch the length of time.  Help the injection last a little bit longer.  She also mentions to me today that she would like to try marijuana or CBD oil.  And Sunday lives in [place].  She is on tramadol.  She would have to talk to Dr. Erlinda Hong about taking that and also taking the controlled substance tramadol which is mild but is still a controlled substance.  I did tell her that there is a store in town that she could inquire about CBD oil.    ROS Otherwise per HPI.  Assessment & Plan: Visit Diagnoses:  1. Pain in right hip   2. Unilateral primary osteoarthritis, left hip     Plan: Findings:  Muscular with therapeutic intra-articular hip injection on the left.  Patient did have relief during the anesthetic phase.    Meds & Orders: No orders of the defined types were placed in this encounter.   Orders Placed This Encounter  Procedures  . Large Joint Inj: L hip joint  . XR C-ARM NO REPORT    Follow-up: Return if symptoms worsen or fail to improve.   Procedures: Large Joint Inj: L hip joint on 04/09/2017 1:10 PM Indications: diagnostic evaluation and pain Details: 22 G 3.5 in needle, fluoroscopy-guided anterior approach  Arthrogram: No  Medications: 80 mg triamcinolone acetonide 40 MG/ML; 3 mL bupivacaine 0.5 % Outcome: tolerated well, no immediate complications  There was excellent flow of contrast producing a partial arthrogram of the hip. The patient did have relief  of symptoms during the anesthetic phase of the injection. Procedure, treatment alternatives, risks and benefits explained, specific risks discussed. Consent was given by the patient. Immediately prior to procedure a time out was called to verify the correct patient, procedure, equipment, support staff and site/side marked as required. Patient was prepped and draped in the usual sterile fashion.      No notes on file   Clinical History: No specialty comments available.  She reports that  has never smoked. she has never used smokeless tobacco. No results for input(s): HGBA1C, LABURIC in the last 8760 hours.  Objective:  VS:  HT:    WT:   BMI:     BP:   HR: bpm  TEMP: ( )  RESP:  Physical Exam  Musculoskeletal:  Painful range of motion of the left hip    Ortho Exam Imaging: No results found.  Past Medical/Family/Surgical/Social History: Medications & Allergies reviewed per EMR Patient Active Problem List   Diagnosis Date Noted  . Primary osteoarthritis of left hip 10/24/2016  . Chronic cholecystitis with calculus 01/11/2015  . Symptomatic cholelithiasis 01/10/2015  . Diverticulosis of colon without hemorrhage 12/26/2014  . Hyperlipidemia 12/26/2014  . Hypothyroidism 12/26/2014   Past Medical History:  Diagnosis Date  . Arthritis   . Autoimmune disease ([facility])    positive ANA, double stranded DNA, anti-RO and anti- LA  . Cholelithiasis   .  Colonic adenoma   . COPD (chronic obstructive pulmonary disease) ([place])    non smoker  . Diverticulosis   . Heart murmur   . Hypertension   . Hypothyroidism   . Osteoporosis    Family History  Problem Relation Age of Onset  . Heart disease Father   . Stomach cancer Brother   . Prostate cancer Brother   . Alzheimer's disease Mother   . Colon cancer Neg Hx    No past surgical history on file. Social History   Occupational History  . Occupation: Retired    Comment: retired from [employer]  . Smoking status: Never  Smoker  . Smokeless tobacco: Never Used  Substance and Sexual Activity  . Alcohol use: Yes    Alcohol/week: 0.0 oz    Comment: Occ. alcohol  . Drug use: No  . Sexual activity: Not on file

## 2017-04-09 NOTE — Patient Instructions (Signed)

## 2017-04-09 NOTE — Progress Notes (Deleted)
Pt here forr left hip injection, pt in pain today, pt was here yesterday and saw Dr. Erlinda Hong was given pain meds and not in as much pain. Left hip is worse pain and makes legs weak when trying to walk "feels like asleep". Tylenol and tramadol helps with the pain. 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 Pt not on any blood thinner.

## 2017-05-20 ENCOUNTER — Telehealth (INDEPENDENT_AMBULATORY_CARE_PROVIDER_SITE_OTHER): Payer: Self-pay | Admitting: Orthopaedic Surgery

## 2017-05-20 NOTE — Telephone Encounter (Signed)
yes

## 2017-05-20 NOTE — Telephone Encounter (Signed)
Patient called saying that her Tramadol is needing a prior authorization. If you could give her a call whenever this is done. Thank you. CB # [phone]

## 2017-05-20 NOTE — Telephone Encounter (Signed)
Patient is asking for a refill of tramadol, ok to call in?

## 2017-05-21 MED ORDER — TRAMADOL HCL 50 MG PO TABS: 50.0000 mg | ORAL_TABLET | Freq: Three times a day (TID) | ORAL | 2 refills | Status: DC | PRN

## 2017-05-21 NOTE — Telephone Encounter (Signed)
Rx called in to [facility], pt aware it will be there

## 2017-05-21 NOTE — Addendum Note (Signed)
Addended by: Brand Males E on: 05/21/2017 09:14 AM   Modules accepted: Orders

## 2017-06-19 ENCOUNTER — Telehealth (INDEPENDENT_AMBULATORY_CARE_PROVIDER_SITE_OTHER): Payer: Self-pay | Admitting: Orthopaedic Surgery

## 2017-06-19 MED ORDER — TRAMADOL HCL 50 MG PO TABS: 50.0000 mg | ORAL_TABLET | Freq: Three times a day (TID) | ORAL | 0 refills | Status: DC | PRN

## 2017-06-19 NOTE — Telephone Encounter (Signed)
Please advise. Thanks.  

## 2017-06-19 NOTE — Telephone Encounter (Signed)
Please advise. thx 

## 2017-06-19 NOTE — Telephone Encounter (Signed)
Patient called requesting a RX refill on her Tramadol.  She also stated that she has had to take 3 a day lately and wants to know if Dr. Erlinda Hong will increase it to a 90-day supply.  CB#[phone].  Thank you.

## 2017-06-19 NOTE — Addendum Note (Signed)
Addended byLaurann Montana on: 06/19/2017 04:36 PM   Modules accepted: Orders

## 2017-06-19 NOTE — Telephone Encounter (Signed)
[facility] [address]

## 2017-06-19 NOTE — Telephone Encounter (Signed)
Called to pharmacy. Patient advised done.  

## 2017-06-19 NOTE — Telephone Encounter (Signed)
That's fine

## 2017-07-06 DIAGNOSIS — E038 Other specified hypothyroidism: Secondary | ICD-10-CM | POA: Diagnosis not present

## 2017-07-06 DIAGNOSIS — E559 Vitamin D deficiency, unspecified: Secondary | ICD-10-CM | POA: Diagnosis not present

## 2017-07-06 DIAGNOSIS — I10 Essential (primary) hypertension: Secondary | ICD-10-CM | POA: Diagnosis not present

## 2017-07-06 DIAGNOSIS — E782 Mixed hyperlipidemia: Secondary | ICD-10-CM | POA: Diagnosis not present

## 2017-07-06 DIAGNOSIS — R82998 Other abnormal findings in urine: Secondary | ICD-10-CM | POA: Diagnosis not present

## 2017-07-13 DIAGNOSIS — K5909 Other constipation: Secondary | ICD-10-CM | POA: Diagnosis not present

## 2017-07-13 DIAGNOSIS — F418 Other specified anxiety disorders: Secondary | ICD-10-CM | POA: Diagnosis not present

## 2017-07-13 DIAGNOSIS — M069 Rheumatoid arthritis, unspecified: Secondary | ICD-10-CM | POA: Diagnosis not present

## 2017-07-13 DIAGNOSIS — Z0000 Encounter for general adult medical examination without abnormal findings: Secondary | ICD-10-CM | POA: Diagnosis not present

## 2017-07-13 DIAGNOSIS — D6489 Other specified anemias: Secondary | ICD-10-CM | POA: Diagnosis not present

## 2017-07-13 DIAGNOSIS — M25552 Pain in left hip: Secondary | ICD-10-CM | POA: Diagnosis not present

## 2017-07-13 DIAGNOSIS — M810 Age-related osteoporosis without current pathological fracture: Secondary | ICD-10-CM | POA: Diagnosis not present

## 2017-07-13 DIAGNOSIS — K6289 Other specified diseases of anus and rectum: Secondary | ICD-10-CM | POA: Diagnosis not present

## 2017-07-13 DIAGNOSIS — B3781 Candidal esophagitis: Secondary | ICD-10-CM | POA: Diagnosis not present

## 2017-07-16 DIAGNOSIS — Z1212 Encounter for screening for malignant neoplasm of rectum: Secondary | ICD-10-CM | POA: Diagnosis not present

## 2017-08-04 DIAGNOSIS — L820 Inflamed seborrheic keratosis: Secondary | ICD-10-CM | POA: Diagnosis not present

## 2017-08-04 DIAGNOSIS — X32XXXD Exposure to sunlight, subsequent encounter: Secondary | ICD-10-CM | POA: Diagnosis not present

## 2017-08-04 DIAGNOSIS — L570 Actinic keratosis: Secondary | ICD-10-CM | POA: Diagnosis not present

## 2017-08-04 DIAGNOSIS — D225 Melanocytic nevi of trunk: Secondary | ICD-10-CM | POA: Diagnosis not present

## 2017-08-11 ENCOUNTER — Telehealth (INDEPENDENT_AMBULATORY_CARE_PROVIDER_SITE_OTHER): Payer: Self-pay | Admitting: Physical Medicine and Rehabilitation

## 2017-08-11 DIAGNOSIS — Z1231 Encounter for screening mammogram for malignant neoplasm of breast: Secondary | ICD-10-CM | POA: Diagnosis not present

## 2017-08-11 NOTE — Telephone Encounter (Signed)
ok 

## 2017-08-12 NOTE — Telephone Encounter (Signed)
Patient reports that her left hip is hurting. This was last done in August. Scheduled for 09/03/17.

## 2017-08-18 DIAGNOSIS — M810 Age-related osteoporosis without current pathological fracture: Secondary | ICD-10-CM | POA: Diagnosis not present

## 2017-08-25 DIAGNOSIS — M810 Age-related osteoporosis without current pathological fracture: Secondary | ICD-10-CM | POA: Diagnosis not present

## 2017-09-02 DIAGNOSIS — H25813 Combined forms of age-related cataract, bilateral: Secondary | ICD-10-CM | POA: Diagnosis not present

## 2017-09-02 DIAGNOSIS — H04123 Dry eye syndrome of bilateral lacrimal glands: Secondary | ICD-10-CM | POA: Diagnosis not present

## 2017-09-03 ENCOUNTER — Encounter (INDEPENDENT_AMBULATORY_CARE_PROVIDER_SITE_OTHER): Payer: Self-pay | Admitting: Physical Medicine and Rehabilitation

## 2017-09-03 ENCOUNTER — Other Ambulatory Visit (HOSPITAL_COMMUNITY): Payer: Self-pay

## 2017-09-03 ENCOUNTER — Ambulatory Visit (INDEPENDENT_AMBULATORY_CARE_PROVIDER_SITE_OTHER): Payer: Self-pay

## 2017-09-03 ENCOUNTER — Ambulatory Visit (INDEPENDENT_AMBULATORY_CARE_PROVIDER_SITE_OTHER): Payer: Medicare HMO | Admitting: Physical Medicine and Rehabilitation

## 2017-09-03 DIAGNOSIS — M25552 Pain in left hip: Secondary | ICD-10-CM | POA: Diagnosis not present

## 2017-09-03 NOTE — Progress Notes (Signed)
 .  Numeric Pain Rating Scale and Functional Assessment Average Pain 5   In the last MONTH (on 0-10 scale) has pain interfered with the following?  1. General activity like being  able to carry out your everyday physical activities such as walking, climbing stairs, carrying groceries, or moving a chair?  Rating(6)   +Driver, -BT, -Dye Allergies.  

## 2017-09-03 NOTE — Patient Instructions (Signed)

## 2017-09-03 NOTE — Progress Notes (Signed)
Sarah Manning - 84 y.o. female MRN [id number]  Date of birth: 1924/09/12  Office Visit Note: Visit Date: 09/03/2017 PCP: Crist Infante, MD Referred by: Crist Infante, MD  Subjective: Chief Complaint  Patient presents with  . Left Hip - Pain   HPI: Sarah Manning is a 84 year old female with significant left hip osteoarthritis.  We have completed an injection back in August and then back in November both giving her good relief.  She has been having worsening hip and groin symptoms over the last several weeks.  She reports good relief with prior injection so will be repeating this today.  Exam is consistent with hip pain with groin pain on internal rotation lack of motion.  She will follow-up with Dr. Erlinda Hong.   ROS Otherwise per HPI.  Assessment & Plan: Visit Diagnoses: No diagnosis found.  Plan: No additional findings.   Meds & Orders: No orders of the defined types were placed in this encounter.  No orders of the defined types were placed in this encounter.   Follow-up: No follow-ups on file.   Procedures: Large Joint Inj: L hip joint on 09/03/2017 2:28 PM Indications: diagnostic evaluation and pain Details: 22 G 3.5 in needle, fluoroscopy-guided anterior approach  Arthrogram: No  Medications: 80 mg triamcinolone acetonide 40 MG/ML; 3 mL bupivacaine 0.5 % Outcome: tolerated well, no immediate complications  There was excellent flow of contrast producing a partial arthrogram of the hip. The patient did have relief of symptoms during the anesthetic phase of the injection. Procedure, treatment alternatives, risks and benefits explained, specific risks discussed. Consent was given by the patient. Immediately prior to procedure a time out was called to verify the correct patient, procedure, equipment, support staff and site/side marked as required. Patient was prepped and draped in the usual sterile fashion.      No notes on file   Clinical History: No specialty comments available.    She reports that she has never smoked. She has never used smokeless tobacco. No results for input(s): HGBA1C, LABURIC in the last 8760 hours.  Objective:  VS:  HT:    WT:   BMI:     BP:   HR: bpm  TEMP: ( )  RESP:  Physical Exam  Ortho Exam Imaging: No results found.  Past Medical/Family/Surgical/Social History: Medications & Allergies reviewed per EMR, new medications updated. Patient Active Problem List   Diagnosis Date Noted  . Primary osteoarthritis of left hip 10/24/2016  . Chronic cholecystitis with calculus 01/11/2015  . Symptomatic cholelithiasis 01/10/2015  . Diverticulosis of colon without hemorrhage 12/26/2014  . Hyperlipidemia 12/26/2014  . Hypothyroidism 12/26/2014   Past Medical History:  Diagnosis Date  . Arthritis   . Autoimmune disease ([place])    positive ANA, double stranded DNA, anti-RO and anti- LA  . Cholelithiasis   . Colonic adenoma   . COPD (chronic obstructive pulmonary disease) ([place])    non smoker  . Diverticulosis   . Heart murmur   . Hypertension   . Hypothyroidism   . Osteoporosis    Family History  Problem Relation Age of Onset  . Heart disease Father   . Stomach cancer Brother   . Prostate cancer Brother   . Alzheimer's disease Mother   . Colon cancer Neg Hx    Past Surgical History:  Procedure Laterality Date  . CHOLECYSTECTOMY N/A 01/11/2015   Procedure: LAPAROSCOPIC CHOLECYSTECTOMY WITH INTRAOPERATIVE CHOLANGIOGRAM;  Surgeon: Armandina Gemma, MD;  Location: [facility];  Service: General;  Laterality:  N/A;   Social History   Occupational History  . Occupation: Retired    Comment: retired from [employer]  . Smoking status: Never Smoker  . Smokeless tobacco: Never Used  Substance and Sexual Activity  . Alcohol use: Yes    Alcohol/week: 0.0 oz    Comment: Occ. alcohol  . Drug use: No  . Sexual activity: Not on file

## 2017-09-04 ENCOUNTER — Ambulatory Visit (HOSPITAL_COMMUNITY)
Admission: RE | Admit: 2017-09-04 | Discharge: 2017-09-04 | Disposition: A | Discharge status: Home / Self Care | Payer: Medicare HMO | Source: Ambulatory Visit | Attending: Internal Medicine | Admitting: Internal Medicine

## 2017-09-04 DIAGNOSIS — M810 Age-related osteoporosis without current pathological fracture: Secondary | ICD-10-CM | POA: Diagnosis not present

## 2017-09-04 MED ORDER — ZOLEDRONIC ACID 5 MG/100ML IV SOLN
5.0000 mg | Freq: Once | INTRAVENOUS | Status: AC
  Administered 2017-09-04: 5 mg via INTRAVENOUS

## 2017-09-04 MED ORDER — ZOLEDRONIC ACID 5 MG/100ML IV SOLN
INTRAVENOUS | Status: AC
  Administered 2017-09-04: 5 mg via INTRAVENOUS
  Filled 2017-09-04: qty 100

## 2017-09-04 NOTE — Discharge Instructions (Signed)

## 2017-09-07 ENCOUNTER — Ambulatory Visit (INDEPENDENT_AMBULATORY_CARE_PROVIDER_SITE_OTHER): Payer: Medicare HMO | Admitting: Orthopaedic Surgery

## 2017-09-07 ENCOUNTER — Encounter (INDEPENDENT_AMBULATORY_CARE_PROVIDER_SITE_OTHER): Payer: Self-pay | Admitting: Orthopaedic Surgery

## 2017-09-07 DIAGNOSIS — M79605 Pain in left leg: Secondary | ICD-10-CM

## 2017-09-07 DIAGNOSIS — M79604 Pain in right leg: Secondary | ICD-10-CM

## 2017-09-07 NOTE — Progress Notes (Signed)
Office Visit Note   Patient: Sarah Manning           Date of Birth: 11-13-1924           MRN: [id number] Visit Date: 09/07/2017              Requested by: Crist Infante, MD [address] [place], [place] [address] PCP: Crist Infante, MD   Assessment & Plan: Visit Diagnoses:  1. Bilateral leg pain     Plan: Impression is bilateral leg pain.  I suspect that she probably has underlying osteoarthritis in multiple joints.  I do not see any focal findings.  I do recommend Voltaren gel on her knees and ankles.  She has good strong pulses distally and she is neurovascular intact.  Follow-up as needed.  Follow-Up Instructions: Return if symptoms worsen or fail to improve.   Orders:  No orders of the defined types were placed in this encounter.  No orders of the defined types were placed in this encounter.     Procedures: No procedures performed   Clinical Data: No additional findings.   Subjective: Chief Complaint  Patient presents with  . Left Leg - Pain  . Right Leg - Pain    Patient is a 84 year old female comes in with vague bilateral leg pain with some reports of numbness and tingling in the toes.  She denies any swelling.  She recently had a hip injection with Dr. Ernestina Patches.  Denies any constitutional symptoms or injuries.   Review of Systems  Constitutional: Negative.   HENT: Negative.   Eyes: Negative.   Respiratory: Negative.   Cardiovascular: Negative.   Endocrine: Negative.   Musculoskeletal: Negative.   Neurological: Negative.   Hematological: Negative.   Psychiatric/Behavioral: Negative.   All other systems reviewed and are negative.    Objective: Vital Signs: There were no vitals taken for this visit.  Physical Exam  Constitutional: She is oriented to person, place, and time. She appears well-developed and well-nourished.  Pulmonary/Chest: Effort normal.  Neurological: She is alert and oriented to person, place, and time.  Skin: Skin is warm.  Capillary refill takes less than 2 seconds.  Psychiatric: She has a normal mood and affect. Her behavior is normal. Judgment and thought content normal.  Nursing note and vitals reviewed.   Ortho Exam Bilateral lower extremity exam shows no pitting edema.  Nonfocal exam.  No joint effusions Specialty Comments:  No specialty comments available.  Imaging: No results found.   PMFS History: Patient Active Problem List   Diagnosis Date Noted  . Primary osteoarthritis of left hip 10/24/2016  . Chronic cholecystitis with calculus 01/11/2015  . Symptomatic cholelithiasis 01/10/2015  . Diverticulosis of colon without hemorrhage 12/26/2014  . Hyperlipidemia 12/26/2014  . Hypothyroidism 12/26/2014   Past Medical History:  Diagnosis Date  . Arthritis   . Autoimmune disease ([place])    positive ANA, double stranded DNA, anti-RO and anti- LA  . Cholelithiasis   . Colonic adenoma   . COPD (chronic obstructive pulmonary disease) ([place])    non smoker  . Diverticulosis   . Heart murmur   . Hypertension   . Hypothyroidism   . Osteoporosis     Family History  Problem Relation Age of Onset  . Heart disease Father   . Stomach cancer Brother   . Prostate cancer Brother   . Alzheimer's disease Mother   . Colon cancer Neg Hx     Past Surgical History:  Procedure Laterality Date  .  CHOLECYSTECTOMY N/A 01/11/2015   Procedure: LAPAROSCOPIC CHOLECYSTECTOMY WITH INTRAOPERATIVE CHOLANGIOGRAM;  Surgeon: Armandina Gemma, MD;  Location: [facility];  Service: General;  Laterality: N/A;   Social History   Occupational History  . Occupation: Retired    Comment: retired from [employer]  . Smoking status: Never Smoker  . Smokeless tobacco: Never Used  Substance and Sexual Activity  . Alcohol use: Yes    Alcohol/week: 0.0 oz    Comment: Occ. alcohol  . Drug use: No  . Sexual activity: Not on file

## 2017-09-10 ENCOUNTER — Telehealth (INDEPENDENT_AMBULATORY_CARE_PROVIDER_SITE_OTHER): Payer: Self-pay | Admitting: Orthopaedic Surgery

## 2017-09-10 ENCOUNTER — Other Ambulatory Visit (INDEPENDENT_AMBULATORY_CARE_PROVIDER_SITE_OTHER): Payer: Self-pay

## 2017-09-10 MED ORDER — TRIAMCINOLONE ACETONIDE 40 MG/ML IJ SUSP
80.0000 mg | INTRAMUSCULAR | Status: AC | PRN
  Administered 2017-09-03: 80 mg via INTRA_ARTICULAR

## 2017-09-10 MED ORDER — BUPIVACAINE HCL 0.5 % IJ SOLN
3.0000 mL | INTRAMUSCULAR | Status: AC | PRN
  Administered 2017-09-03: 3 mL via INTRA_ARTICULAR

## 2017-09-10 MED ORDER — DICLOFENAC SODIUM 1 % TD GEL: 2.0000 g | Freq: Four times a day (QID) | TRANSDERMAL | 1 refills | Status: DC

## 2017-09-10 NOTE — Telephone Encounter (Signed)
Patient called advised the Rx is not showing received at the pharmacy. Patient advised she uses the [facility] on [address]. The number to contact patient is [phone]

## 2017-09-10 NOTE — Telephone Encounter (Signed)
FAXED VOLTAREN GEL TO HER PHARM

## 2017-09-30 DIAGNOSIS — H6981 Other specified disorders of Eustachian tube, right ear: Secondary | ICD-10-CM | POA: Diagnosis not present

## 2017-09-30 DIAGNOSIS — Z6822 Body mass index (BMI) 22.0-22.9, adult: Secondary | ICD-10-CM | POA: Diagnosis not present

## 2017-10-13 DIAGNOSIS — H6980 Other specified disorders of Eustachian tube, unspecified ear: Secondary | ICD-10-CM | POA: Diagnosis not present

## 2017-10-13 DIAGNOSIS — H9041 Sensorineural hearing loss, unilateral, right ear, with unrestricted hearing on the contralateral side: Secondary | ICD-10-CM | POA: Diagnosis not present

## 2017-10-13 DIAGNOSIS — IMO0001 Reserved for inherently not codable concepts without codable children: Secondary | ICD-10-CM | POA: Insufficient documentation

## 2017-10-13 DIAGNOSIS — H93291 Other abnormal auditory perceptions, right ear: Secondary | ICD-10-CM | POA: Diagnosis not present

## 2017-10-28 DIAGNOSIS — H9041 Sensorineural hearing loss, unilateral, right ear, with unrestricted hearing on the contralateral side: Secondary | ICD-10-CM | POA: Diagnosis not present

## 2017-11-03 ENCOUNTER — Other Ambulatory Visit (INDEPENDENT_AMBULATORY_CARE_PROVIDER_SITE_OTHER): Payer: Self-pay | Admitting: Orthopaedic Surgery

## 2017-11-04 NOTE — Telephone Encounter (Signed)
Called into pharm  

## 2017-11-24 ENCOUNTER — Telehealth (INDEPENDENT_AMBULATORY_CARE_PROVIDER_SITE_OTHER): Payer: Self-pay | Admitting: Physical Medicine and Rehabilitation

## 2017-11-24 NOTE — Telephone Encounter (Signed)
Ok to repeat if helped etc

## 2017-11-25 NOTE — Telephone Encounter (Signed)
Scheduled for 12/14/17 at 1445.

## 2017-12-14 ENCOUNTER — Telehealth (INDEPENDENT_AMBULATORY_CARE_PROVIDER_SITE_OTHER): Payer: Self-pay | Admitting: *Deleted

## 2017-12-14 ENCOUNTER — Encounter (INDEPENDENT_AMBULATORY_CARE_PROVIDER_SITE_OTHER): Payer: Self-pay | Admitting: Physical Medicine and Rehabilitation

## 2017-12-14 ENCOUNTER — Ambulatory Visit (INDEPENDENT_AMBULATORY_CARE_PROVIDER_SITE_OTHER): Payer: Medicare HMO | Admitting: Physical Medicine and Rehabilitation

## 2017-12-14 ENCOUNTER — Ambulatory Visit (INDEPENDENT_AMBULATORY_CARE_PROVIDER_SITE_OTHER): Payer: Self-pay

## 2017-12-14 DIAGNOSIS — M25552 Pain in left hip: Secondary | ICD-10-CM | POA: Diagnosis not present

## 2017-12-14 NOTE — Telephone Encounter (Signed)
#  30 of tramadol.  Yes to voltaren

## 2017-12-14 NOTE — Progress Notes (Signed)
Sarah Manning - 84 y.o. female MRN [id number]  Date of birth: Dec 17, 1924  Office Visit Note: Visit Date: 12/14/2017 PCP: Crist Infante, MD Referred by: Crist Infante, MD  Subjective: Chief Complaint  Patient presents with  . Left Hip - Pain  . Left Leg - Pain   HPI: Sarah Manning is a 84 year old female with significant left hip osteoarthritis.  We have completed an injection back in August and then back in November 2018 both giving her good relief.  She has been having worsening hip and groin symptoms over the last several weeks.  Last injection of the left hip was in April.  She said it did help quite a bit but again over the last few weeks its return.  Injections seem to last about 3 months or so.  She is not really a very good candidate according to Dr. Erlinda Hong with her age.  Repeating the injection on a 108-month basis is really going to be of no harm if it does help and keep her functional.  We will repeat the injection today.  Depending on symptomatic relief there still may be some underlying spine issue but her main issue is the left hip osteoarthritis.   ROS Otherwise per HPI.  Assessment & Plan: Visit Diagnoses:  1. Pain in left hip     Plan: No additional findings.   Meds & Orders: No orders of the defined types were placed in this encounter.   Orders Placed This Encounter  Procedures  . Large Joint Inj: L hip joint  . XR C-ARM NO REPORT    Follow-up: Return if symptoms worsen or fail to improve.   Procedures: Large Joint Inj: L hip joint on 12/14/2017 3:10 PM Indications: diagnostic evaluation and pain Details: 22 G 3.5 in needle, fluoroscopy-guided anterior approach  Arthrogram: No  Medications: 80 mg triamcinolone acetonide 40 MG/ML; 3 mL bupivacaine 0.5 % Outcome: tolerated well, no immediate complications  There was excellent flow of contrast producing a partial arthrogram of the hip. The patient did have relief of symptoms during the anesthetic phase of the  injection. Procedure, treatment alternatives, risks and benefits explained, specific risks discussed. Consent was given by the patient. Immediately prior to procedure a time out was called to verify the correct patient, procedure, equipment, support staff and site/side marked as required. Patient was prepped and draped in the usual sterile fashion.      No notes on file   Clinical History: No specialty comments available.   She reports that she has never smoked. She has never used smokeless tobacco. No results for input(s): HGBA1C, LABURIC in the last 8760 hours.  Objective:  VS:  HT:    WT:   BMI:     BP:   HR: bpm  TEMP: ( )  RESP:  Physical Exam  Ortho Exam Imaging: No results found.  Past Medical/Family/Surgical/Social History: Medications & Allergies reviewed per EMR, new medications updated. Patient Active Problem List   Diagnosis Date Noted  . Primary osteoarthritis of left hip 10/24/2016  . Chronic cholecystitis with calculus 01/11/2015  . Symptomatic cholelithiasis 01/10/2015  . Diverticulosis of colon without hemorrhage 12/26/2014  . Hyperlipidemia 12/26/2014  . Hypothyroidism 12/26/2014   Past Medical History:  Diagnosis Date  . Arthritis   . Autoimmune disease ([place])    positive ANA, double stranded DNA, anti-RO and anti- LA  . Cholelithiasis   . Colonic adenoma   . COPD (chronic obstructive pulmonary disease) ([place])    non smoker  .  Diverticulosis   . Heart murmur   . Hypertension   . Hypothyroidism   . Osteoporosis    Family History  Problem Relation Age of Onset  . Heart disease Father   . Stomach cancer Brother   . Prostate cancer Brother   . Alzheimer's disease Mother   . Colon cancer Neg Hx    Past Surgical History:  Procedure Laterality Date  . CHOLECYSTECTOMY N/A 01/11/2015   Procedure: LAPAROSCOPIC CHOLECYSTECTOMY WITH INTRAOPERATIVE CHOLANGIOGRAM;  Surgeon: Armandina Gemma, MD;  Location: [facility];  Service: General;  Laterality: N/A;    Social History   Occupational History  . Occupation: Retired    Comment: retired from [employer]  . Smoking status: Never Smoker  . Smokeless tobacco: Never Used  Substance and Sexual Activity  . Alcohol use: Yes    Alcohol/week: 0.0 oz    Comment: Occ. alcohol  . Drug use: No  . Sexual activity: Not on file

## 2017-12-14 NOTE — Patient Instructions (Signed)

## 2017-12-14 NOTE — Telephone Encounter (Signed)
Please advise 

## 2017-12-14 NOTE — Progress Notes (Signed)
.  Numeric Pain Rating Scale and Functional Assessment Average Pain 7   In the last MONTH (on 0-10 scale) has pain interfered with the following?  1. General activity like being  able to carry out your everyday physical activities such as walking, climbing stairs, carrying groceries, or moving a chair?  Rating(6)   -Dye Allergies.  

## 2017-12-15 ENCOUNTER — Other Ambulatory Visit (INDEPENDENT_AMBULATORY_CARE_PROVIDER_SITE_OTHER): Payer: Self-pay

## 2017-12-15 MED ORDER — DICLOFENAC SODIUM 1 % TD GEL: 2.0000 g | Freq: Four times a day (QID) | TRANSDERMAL | 1 refills | Status: DC

## 2017-12-15 MED ORDER — TRAMADOL HCL 50 MG PO TABS: 50.0000 mg | ORAL_TABLET | Freq: Three times a day (TID) | ORAL | 0 refills | Status: DC | PRN

## 2017-12-15 NOTE — Telephone Encounter (Signed)
Called into pharm  

## 2017-12-24 MED ORDER — BUPIVACAINE HCL 0.5 % IJ SOLN
3.0000 mL | INTRAMUSCULAR | Status: AC | PRN
  Administered 2017-12-14: 3 mL via INTRA_ARTICULAR

## 2017-12-24 MED ORDER — TRIAMCINOLONE ACETONIDE 40 MG/ML IJ SUSP
80.0000 mg | INTRAMUSCULAR | Status: AC | PRN
  Administered 2017-12-14: 80 mg via INTRA_ARTICULAR

## 2018-01-11 DIAGNOSIS — K219 Gastro-esophageal reflux disease without esophagitis: Secondary | ICD-10-CM | POA: Diagnosis not present

## 2018-01-11 DIAGNOSIS — M810 Age-related osteoporosis without current pathological fracture: Secondary | ICD-10-CM | POA: Diagnosis not present

## 2018-01-11 DIAGNOSIS — M069 Rheumatoid arthritis, unspecified: Secondary | ICD-10-CM | POA: Diagnosis not present

## 2018-01-11 DIAGNOSIS — F418 Other specified anxiety disorders: Secondary | ICD-10-CM | POA: Diagnosis not present

## 2018-01-11 DIAGNOSIS — E038 Other specified hypothyroidism: Secondary | ICD-10-CM | POA: Diagnosis not present

## 2018-01-11 DIAGNOSIS — Z6822 Body mass index (BMI) 22.0-22.9, adult: Secondary | ICD-10-CM | POA: Diagnosis not present

## 2018-01-11 DIAGNOSIS — I10 Essential (primary) hypertension: Secondary | ICD-10-CM | POA: Diagnosis not present

## 2018-02-18 ENCOUNTER — Telehealth (INDEPENDENT_AMBULATORY_CARE_PROVIDER_SITE_OTHER): Payer: Self-pay | Admitting: Orthopaedic Surgery

## 2018-02-18 NOTE — Telephone Encounter (Signed)
Please advise,

## 2018-02-18 NOTE — Telephone Encounter (Signed)
Yes #30 of each

## 2018-02-18 NOTE — Telephone Encounter (Signed)
Patient called needing Rx refilled (Tramadol and Diclofenac)  Patient uses the [facility] [address] The number to contact patient is [phone]

## 2018-02-19 ENCOUNTER — Other Ambulatory Visit (INDEPENDENT_AMBULATORY_CARE_PROVIDER_SITE_OTHER): Payer: Self-pay

## 2018-02-19 MED ORDER — DICLOFENAC SODIUM 1 % TD GEL: 2.0000 g | Freq: Four times a day (QID) | TRANSDERMAL | 1 refills | Status: DC

## 2018-02-19 MED ORDER — TRAMADOL HCL 50 MG PO TABS: 50.0000 mg | ORAL_TABLET | Freq: Two times a day (BID) | ORAL | 0 refills | Status: DC

## 2018-02-19 NOTE — Telephone Encounter (Signed)
Sent to pharm, patient aware.

## 2018-03-05 ENCOUNTER — Telehealth (INDEPENDENT_AMBULATORY_CARE_PROVIDER_SITE_OTHER): Payer: Self-pay | Admitting: Physical Medicine and Rehabilitation

## 2018-03-05 NOTE — Telephone Encounter (Signed)
Ok to repeat 

## 2018-03-05 NOTE — Telephone Encounter (Signed)
Pt scheduled for rpt left hip injection.

## 2018-03-06 DIAGNOSIS — Z23 Encounter for immunization: Secondary | ICD-10-CM | POA: Diagnosis not present

## 2018-03-22 ENCOUNTER — Ambulatory Visit (INDEPENDENT_AMBULATORY_CARE_PROVIDER_SITE_OTHER): Payer: Medicare HMO | Admitting: Physical Medicine and Rehabilitation

## 2018-03-22 ENCOUNTER — Encounter (INDEPENDENT_AMBULATORY_CARE_PROVIDER_SITE_OTHER): Payer: Self-pay | Admitting: Physical Medicine and Rehabilitation

## 2018-03-22 ENCOUNTER — Ambulatory Visit (INDEPENDENT_AMBULATORY_CARE_PROVIDER_SITE_OTHER): Payer: Self-pay

## 2018-03-22 DIAGNOSIS — M25552 Pain in left hip: Secondary | ICD-10-CM | POA: Diagnosis not present

## 2018-03-22 NOTE — Patient Instructions (Signed)

## 2018-03-22 NOTE — Progress Notes (Signed)
   Sarah Manning - 85 y.o. female MRN [id number]  Date of birth: 02-16-1925  Office Visit Note: Visit Date: 03/22/2018 PCP: Crist Infante, MD Referred by: Crist Infante, MD  Subjective: Chief Complaint  Patient presents with  . Left Hip - Pain   HPI:  Sarah Manning is a 84 y.o. female who comes in today with significant left hip osteoarthritis.  We have completed fluoroscopically guided hip injections about every 3 months beginning last year.  The injections helped 80 to 90% but really not lasting any longer than 3 months.  The last injection did quite well and that was in July of this year.  She is not really a very good candidate according to Dr. Erlinda Hong with her age for hip replacement.  Repeating the injection on a 38-month basis is really going to be of no harm if it does help and keep her functional.  We will repeat the injection today.  Depending on symptomatic relief there still may be some underlying spine issue but her main issue is the left hip osteoarthritis.    ROS Otherwise per HPI.  Assessment & Plan: Visit Diagnoses:  1. Pain in left hip     Plan: No additional findings.   Meds & Orders: No orders of the defined types were placed in this encounter.   Orders Placed This Encounter  Procedures  . Large Joint Inj: L hip joint  . XR C-ARM NO REPORT    Follow-up: Return if symptoms worsen or fail to improve.   Procedures: Large Joint Inj: L hip joint on 03/22/2018 3:25 PM Indications: diagnostic evaluation and pain Details: 22 G 3.5 in needle, fluoroscopy-guided anterior approach  Arthrogram: No  Medications: 80 mg triamcinolone acetonide 40 MG/ML; 3 mL bupivacaine 0.5 % Outcome: tolerated well, no immediate complications  There was excellent flow of contrast producing a partial arthrogram of the hip. The patient did have relief of symptoms during the anesthetic phase of the injection. Procedure, treatment alternatives, risks and benefits explained, specific risks  discussed. Consent was given by the patient. Immediately prior to procedure a time out was called to verify the correct patient, procedure, equipment, support staff and site/side marked as required. Patient was prepped and draped in the usual sterile fashion.      No notes on file   Clinical History: No specialty comments available.     Objective:  VS:  HT:    WT:   BMI:     BP:   HR: bpm  TEMP: ( )  RESP:  Physical Exam  Ortho Exam Imaging: No results found.

## 2018-03-22 NOTE — Progress Notes (Signed)
 .  Numeric Pain Rating Scale and Functional Assessment Average Pain 7   In the last MONTH (on 0-10 scale) has pain interfered with the following?  1. General activity like being  able to carry out your everyday physical activities such as walking, climbing stairs, carrying groceries, or moving a chair?  Rating(4)   +Driver, -BT, -Dye Allergies.  

## 2018-03-31 MED ORDER — TRIAMCINOLONE ACETONIDE 40 MG/ML IJ SUSP
80.0000 mg | INTRAMUSCULAR | Status: AC | PRN
  Administered 2018-03-22: 80 mg via INTRA_ARTICULAR

## 2018-03-31 MED ORDER — BUPIVACAINE HCL 0.5 % IJ SOLN
3.0000 mL | INTRAMUSCULAR | Status: AC | PRN
  Administered 2018-03-22: 3 mL via INTRA_ARTICULAR

## 2018-04-17 ENCOUNTER — Other Ambulatory Visit (INDEPENDENT_AMBULATORY_CARE_PROVIDER_SITE_OTHER): Payer: Self-pay | Admitting: Orthopaedic Surgery

## 2018-04-23 ENCOUNTER — Other Ambulatory Visit (INDEPENDENT_AMBULATORY_CARE_PROVIDER_SITE_OTHER): Payer: Self-pay | Admitting: Orthopaedic Surgery

## 2018-04-23 NOTE — Telephone Encounter (Signed)
Ok to call in

## 2018-04-27 NOTE — Telephone Encounter (Signed)
Called into pharm  

## 2018-05-04 ENCOUNTER — Telehealth (INDEPENDENT_AMBULATORY_CARE_PROVIDER_SITE_OTHER): Payer: Self-pay | Admitting: Physical Medicine and Rehabilitation

## 2018-05-04 NOTE — Telephone Encounter (Signed)
Ok, but will need to tell her we need to limit to 1 every 3 months or if not lasting needs to f/up Dr. Erlinda Hong. Could also think about using phenol

## 2018-05-05 NOTE — Telephone Encounter (Signed)
Scheduled for 12/17 at 1515.

## 2018-05-18 ENCOUNTER — Ambulatory Visit (INDEPENDENT_AMBULATORY_CARE_PROVIDER_SITE_OTHER): Payer: Medicare HMO | Admitting: Physical Medicine and Rehabilitation

## 2018-05-27 ENCOUNTER — Other Ambulatory Visit (INDEPENDENT_AMBULATORY_CARE_PROVIDER_SITE_OTHER): Payer: Self-pay | Admitting: Physician Assistant

## 2018-05-27 NOTE — Telephone Encounter (Signed)
Ok to refill 

## 2018-05-27 NOTE — Telephone Encounter (Signed)
Called into pharm  

## 2018-06-10 ENCOUNTER — Encounter (INDEPENDENT_AMBULATORY_CARE_PROVIDER_SITE_OTHER): Payer: Self-pay | Admitting: Physical Medicine and Rehabilitation

## 2018-06-10 ENCOUNTER — Ambulatory Visit (INDEPENDENT_AMBULATORY_CARE_PROVIDER_SITE_OTHER): Payer: Self-pay

## 2018-06-10 ENCOUNTER — Ambulatory Visit (INDEPENDENT_AMBULATORY_CARE_PROVIDER_SITE_OTHER): Payer: Medicare HMO | Admitting: Physical Medicine and Rehabilitation

## 2018-06-10 DIAGNOSIS — M25552 Pain in left hip: Secondary | ICD-10-CM

## 2018-06-10 DIAGNOSIS — M1612 Unilateral primary osteoarthritis, left hip: Secondary | ICD-10-CM

## 2018-06-10 NOTE — Progress Notes (Signed)
 .  Numeric Pain Rating Scale and Functional Assessment Average Pain 7   In the last MONTH (on 0-10 scale) has pain interfered with the following?  1. General activity like being  able to carry out your everyday physical activities such as walking, climbing stairs, carrying groceries, or moving a chair?  Rating(8)  -Dye Allergies.

## 2018-06-10 NOTE — Patient Instructions (Signed)

## 2018-06-10 NOTE — Progress Notes (Signed)
Sarah Manning - 85 y.o. female MRN [id number]  Date of birth: 1925/01/21  Office Visit Note: Visit Date: 06/10/2018 PCP: Crist Infante, MD Referred by: Crist Infante, MD  Subjective: Chief Complaint  Patient presents with  . Left Leg - Pain  . Left Hip - Pain  . Left Knee - Pain   HPI:  Sarah Manning is a 85 y.o. female who comes in today For planned repeat left intra-articular hip injection.  Patient has end-stage arthritis of the left hip with concordant left hip and groin pain.  Last injection 03/22/2018 gave her 70% relief for about 2 months.  We are consistently getting about 2 months of relief.  She reports pain with walking and ambulating.  She is using Tylenol.  She is very young appearing 85 year old female really without significant medical problems.  She has no pain on the right side.  She has no radicular pain or paresthesia.  We will repeat the injection today.  I am going to make her an appointment with Dr. Jean Rosenthal for evaluation for possible anterior hip replacement.  We discussed this with the patient I think it is worth her time to at least talk to him to see if there is anything could be done at her age.  He has done similar patients in this age range before.  We even talked about the fact that sometimes they do these without general anesthesia.  She is willing to speak with and I think that would be wise.  She is fairly active 85 year old otherwise.  ROS Otherwise per HPI.  Assessment & Plan: Visit Diagnoses:  1. Pain in left hip   2. Unilateral primary osteoarthritis, left hip     Plan: No additional findings.   Meds & Orders: No orders of the defined types were placed in this encounter.   Orders Placed This Encounter  Procedures  . Large Joint Inj: L hip joint  . XR C-ARM NO REPORT    Follow-up: Return if symptoms worsen or fail to improve.   Procedures: Large Joint Inj: L hip joint on 06/10/2018 9:51 AM Indications: diagnostic evaluation and  pain Details: 22 G 3.5 in needle, fluoroscopy-guided anterior approach  Arthrogram: No  Medications: 80 mg triamcinolone acetonide 40 MG/ML; 3 mL bupivacaine 0.5 % Outcome: tolerated well, no immediate complications  There was excellent flow of contrast producing a partial arthrogram of the hip. The patient did have relief of symptoms during the anesthetic phase of the injection. Procedure, treatment alternatives, risks and benefits explained, specific risks discussed. Consent was given by the patient. Immediately prior to procedure a time out was called to verify the correct patient, procedure, equipment, support staff and site/side marked as required. Patient was prepped and draped in the usual sterile fashion.      No notes on file   Clinical History: No specialty comments available.     Objective:  VS:  HT:    WT:   BMI:     BP:   HR: bpm  TEMP: ( )  RESP:  Physical Exam Musculoskeletal:     Comments: Patient has concordant hip pain on the left with rotation internally with groin pain.  She has good distal strength without clonus.  Neurological:     General: No focal deficit present.     Mental Status: She is oriented to person, place, and time.  Psychiatric:        Mood and Affect: Mood normal.  Behavior: Behavior normal.     Ortho Exam Imaging: No results found.

## 2018-06-11 MED ORDER — TRIAMCINOLONE ACETONIDE 40 MG/ML IJ SUSP
80.0000 mg | INTRAMUSCULAR | Status: AC | PRN
Start: 2018-06-10 — End: 2018-06-10
  Administered 2018-06-10: 80 mg via INTRA_ARTICULAR

## 2018-06-11 MED ORDER — BUPIVACAINE HCL 0.5 % IJ SOLN
3.0000 mL | INTRAMUSCULAR | Status: AC | PRN
  Administered 2018-06-10: 3 mL via INTRA_ARTICULAR

## 2018-06-18 DIAGNOSIS — Z6822 Body mass index (BMI) 22.0-22.9, adult: Secondary | ICD-10-CM | POA: Diagnosis not present

## 2018-06-18 DIAGNOSIS — A084 Viral intestinal infection, unspecified: Secondary | ICD-10-CM | POA: Diagnosis not present

## 2018-06-18 DIAGNOSIS — F418 Other specified anxiety disorders: Secondary | ICD-10-CM | POA: Diagnosis not present

## 2018-07-01 ENCOUNTER — Institutional Professional Consult (permissible substitution) (INDEPENDENT_AMBULATORY_CARE_PROVIDER_SITE_OTHER): Payer: Medicare HMO | Admitting: Orthopaedic Surgery

## 2018-07-05 ENCOUNTER — Ambulatory Visit (INDEPENDENT_AMBULATORY_CARE_PROVIDER_SITE_OTHER): Payer: Medicare HMO

## 2018-07-05 ENCOUNTER — Encounter (INDEPENDENT_AMBULATORY_CARE_PROVIDER_SITE_OTHER): Payer: Self-pay | Admitting: Orthopaedic Surgery

## 2018-07-05 ENCOUNTER — Ambulatory Visit (INDEPENDENT_AMBULATORY_CARE_PROVIDER_SITE_OTHER): Payer: Medicare HMO | Admitting: Orthopaedic Surgery

## 2018-07-05 DIAGNOSIS — M1612 Unilateral primary osteoarthritis, left hip: Secondary | ICD-10-CM | POA: Diagnosis not present

## 2018-07-05 DIAGNOSIS — M25552 Pain in left hip: Secondary | ICD-10-CM | POA: Diagnosis not present

## 2018-07-05 NOTE — Progress Notes (Signed)
Office Visit Note   Patient: Sarah Manning           Date of Birth: 07/18/1924           MRN: [id number] Visit Date: 07/05/2018              Requested by: Crist Infante, MD [address] [place], [place] [address] PCP: Crist Infante, MD   Assessment & Plan: Visit Diagnoses:  1. Pain in left hip   2. Unilateral primary osteoarthritis, left hip     Plan: I showed her a significant amount exercises to try to strengthen both of her legs and hips.  I think that route combined with any type of over-the-counter supplements such as glucosamine or turmeric can help as well as even CBD oil.  I think staying active also be helpful.  I did show her hip replacement model and went over x-rays with her in detail and explained what the surgery involves in detail.  All questions concerns were answered and addressed.  Follow-up will be as needed.  She knows to wait at least several months before considering another intra-articular steroid injection in her left hip.  Follow-Up Instructions: Return if symptoms worsen or fail to improve.   Orders:  Orders Placed This Encounter  Procedures  . XR HIP UNILAT W OR W/O PELVIS 1V LEFT   No orders of the defined types were placed in this encounter.     Procedures: No procedures performed   Clinical Data: No additional findings.   Subjective: Chief Complaint  Patient presents with  . Left Hip - Pain  Patient is a very pleasan. 85 year old female that I am seeing for the first time in the office but she sees Dr. Ernestina Patches regularly and he is provided steroid injections under direct fluoroscopy in her left hip before.  Her last 1 was just done in December.  She says it is not lasting as long as she like it to.  She wants to know what her other options may be.  She does use a cane in her opposite hand.  She has a walker at home as well.  She feels that she is too old to have surgery and she does not want to have it because is not as painful with her as she  feels like it should be to have hip replacement surgery.  Her grandson is with her today as well.  She does take Tylenol and occasional tramadol for pain.  It does hurt in the groin on her left side.  She does wear compressive hose on both legs for swelling.  HPI  Review of Systems She currently denies any headache, chest pain, shortness of breath, fever, chills, nausea, vomiting  Objective: Vital Signs: There were no vitals taken for this visit.  Physical Exam She is alert and orient x3 and in no acute distress Ortho Exam Examination of her right hip is normal examination of her left hip shows just some slight stiffness with internal and external rotation and only mild pain. Specialty Comments:  No specialty comments available.  Imaging: Xr Hip Unilat W Or W/o Pelvis 1v Left  Result Date: 07/05/2018 An AP pelvis and lateral left hip show significant left hip joint space narrowing with sclerotic change in the femoral head and large para-articular osteophytes.    PMFS History: Patient Active Problem List   Diagnosis Date Noted  . Unilateral primary osteoarthritis, left hip 07/05/2018  . Primary osteoarthritis of left hip 10/24/2016  .  Chronic cholecystitis with calculus 01/11/2015  . Symptomatic cholelithiasis 01/10/2015  . Diverticulosis of colon without hemorrhage 12/26/2014  . Hyperlipidemia 12/26/2014  . Hypothyroidism 12/26/2014   Past Medical History:  Diagnosis Date  . Arthritis   . Autoimmune disease ([facility])    positive ANA, double stranded DNA, anti-RO and anti- LA  . Cholelithiasis   . Colonic adenoma   . COPD (chronic obstructive pulmonary disease) ([facility])    non smoker  . Diverticulosis   . Heart murmur   . Hypertension   . Hypothyroidism   . Osteoporosis     Family History  Problem Relation Age of Onset  . Heart disease Father   . Stomach cancer Brother   . Prostate cancer Brother   . Alzheimer's disease Mother   . Colon cancer Neg Hx     Past Surgical  History:  Procedure Laterality Date  . CHOLECYSTECTOMY N/A 01/11/2015   Procedure: LAPAROSCOPIC CHOLECYSTECTOMY WITH INTRAOPERATIVE CHOLANGIOGRAM;  Surgeon: Armandina Gemma, MD;  Location: [facility];  Service: General;  Laterality: N/A;   Social History   Occupational History  . Occupation: Retired    Comment: retired from [employer]  . Smoking status: Never Smoker  . Smokeless tobacco: Never Used  Substance and Sexual Activity  . Alcohol use: Yes    Alcohol/week: 0.0 standard drinks    Comment: Occ. alcohol  . Drug use: No  . Sexual activity: Not on file

## 2018-07-09 ENCOUNTER — Other Ambulatory Visit (INDEPENDENT_AMBULATORY_CARE_PROVIDER_SITE_OTHER): Payer: Self-pay | Admitting: Physician Assistant

## 2018-07-13 NOTE — Telephone Encounter (Signed)
We have not seen her in nearly a year.  Saw [facility] recently.  If needs refilled, probably best to send to them

## 2018-07-13 NOTE — Telephone Encounter (Signed)
See message below °

## 2018-07-27 DIAGNOSIS — I10 Essential (primary) hypertension: Secondary | ICD-10-CM | POA: Diagnosis not present

## 2018-07-27 DIAGNOSIS — E782 Mixed hyperlipidemia: Secondary | ICD-10-CM | POA: Diagnosis not present

## 2018-07-27 DIAGNOSIS — E559 Vitamin D deficiency, unspecified: Secondary | ICD-10-CM | POA: Diagnosis not present

## 2018-07-27 DIAGNOSIS — E038 Other specified hypothyroidism: Secondary | ICD-10-CM | POA: Diagnosis not present

## 2018-08-11 DIAGNOSIS — D6489 Other specified anemias: Secondary | ICD-10-CM | POA: Diagnosis not present

## 2018-08-11 DIAGNOSIS — K5909 Other constipation: Secondary | ICD-10-CM | POA: Diagnosis not present

## 2018-08-11 DIAGNOSIS — Z1331 Encounter for screening for depression: Secondary | ICD-10-CM | POA: Diagnosis not present

## 2018-08-11 DIAGNOSIS — I10 Essential (primary) hypertension: Secondary | ICD-10-CM | POA: Diagnosis not present

## 2018-08-11 DIAGNOSIS — A084 Viral intestinal infection, unspecified: Secondary | ICD-10-CM | POA: Diagnosis not present

## 2018-08-11 DIAGNOSIS — Z1339 Encounter for screening examination for other mental health and behavioral disorders: Secondary | ICD-10-CM | POA: Diagnosis not present

## 2018-08-11 DIAGNOSIS — R82998 Other abnormal findings in urine: Secondary | ICD-10-CM | POA: Diagnosis not present

## 2018-08-11 DIAGNOSIS — M25552 Pain in left hip: Secondary | ICD-10-CM | POA: Diagnosis not present

## 2018-08-11 DIAGNOSIS — M810 Age-related osteoporosis without current pathological fracture: Secondary | ICD-10-CM | POA: Diagnosis not present

## 2018-08-11 DIAGNOSIS — H6981 Other specified disorders of Eustachian tube, right ear: Secondary | ICD-10-CM | POA: Diagnosis not present

## 2018-08-11 DIAGNOSIS — Z0000 Encounter for general adult medical examination without abnormal findings: Secondary | ICD-10-CM | POA: Diagnosis not present

## 2018-08-22 ENCOUNTER — Other Ambulatory Visit (INDEPENDENT_AMBULATORY_CARE_PROVIDER_SITE_OTHER): Payer: Self-pay | Admitting: Orthopaedic Surgery

## 2018-08-23 NOTE — Telephone Encounter (Signed)
Please advise 

## 2018-08-24 ENCOUNTER — Telehealth (INDEPENDENT_AMBULATORY_CARE_PROVIDER_SITE_OTHER): Payer: Self-pay | Admitting: Physical Medicine and Rehabilitation

## 2018-08-24 NOTE — Telephone Encounter (Signed)
yes

## 2018-08-26 NOTE — Telephone Encounter (Signed)
Scheduled for 5/4 at 1330.

## 2018-09-06 ENCOUNTER — Telehealth (INDEPENDENT_AMBULATORY_CARE_PROVIDER_SITE_OTHER): Payer: Self-pay | Admitting: Orthopaedic Surgery

## 2018-09-06 NOTE — Telephone Encounter (Signed)
I put her on [facility] schedule

## 2018-09-06 NOTE — Telephone Encounter (Signed)
Should I try to schedule with [facility]?

## 2018-09-06 NOTE — Telephone Encounter (Signed)
Ok to schedule with Dr. Junius Roads for a steroid injection in her hip.

## 2018-09-06 NOTE — Telephone Encounter (Signed)
Cancelled appointment with Dr. Ernestina Patches.

## 2018-09-07 ENCOUNTER — Other Ambulatory Visit: Payer: Self-pay

## 2018-09-07 ENCOUNTER — Encounter (INDEPENDENT_AMBULATORY_CARE_PROVIDER_SITE_OTHER): Payer: Self-pay | Admitting: Family Medicine

## 2018-09-07 ENCOUNTER — Ambulatory Visit (INDEPENDENT_AMBULATORY_CARE_PROVIDER_SITE_OTHER): Payer: Medicare HMO | Admitting: Family Medicine

## 2018-09-07 VITALS — BP 123/67 | HR 75 | Temp 98.0°F | Ht 65.0 in | Wt 136.0 lb

## 2018-09-07 DIAGNOSIS — M1612 Unilateral primary osteoarthritis, left hip: Secondary | ICD-10-CM | POA: Diagnosis not present

## 2018-09-07 MED ORDER — TRAMADOL HCL 50 MG PO TABS: 50.0000 mg | ORAL_TABLET | Freq: Three times a day (TID) | ORAL | 0 refills | Status: DC | PRN

## 2018-09-07 MED ORDER — DICLOFENAC SODIUM 1 % TD GEL: 2.0000 g | Freq: Four times a day (QID) | TRANSDERMAL | 1 refills | Status: DC

## 2018-09-07 MED ORDER — METHYLPREDNISOLONE ACETATE 40 MG/ML IJ SUSP: 40.0000 mg | Freq: Once | INTRAMUSCULAR | Status: DC

## 2018-09-07 NOTE — Progress Notes (Signed)
Subjective: Patient is here for ultrasound-guided intra-articular left hip injection.  Objective: Limited range of motion and pain with internal rotation of the left hip, also tender at the greater trochanter.  Procedure: Ultrasound-guided left hip injection: After sterile prep with Betadine, injected 8 cc 1% lidocaine without epinephrine and 40 mg methylprednisolone using a 22-gauge spinal needle, passing the needle through the iliofemoral ligament into the femoral head/neck junction.  Injectate was seen filling the joint capsule.  She had good pain relief during the immediate anesthetic phase.  Follow-up as directed.

## 2018-09-22 ENCOUNTER — Other Ambulatory Visit (INDEPENDENT_AMBULATORY_CARE_PROVIDER_SITE_OTHER): Payer: Self-pay | Admitting: Orthopaedic Surgery

## 2018-09-22 MED ORDER — ACETAMINOPHEN-CODEINE #3 300-30 MG PO TABS: 1.0000 | ORAL_TABLET | Freq: Three times a day (TID) | ORAL | 0 refills | Status: DC | PRN

## 2018-09-27 ENCOUNTER — Telehealth (INDEPENDENT_AMBULATORY_CARE_PROVIDER_SITE_OTHER): Payer: Self-pay | Admitting: Physical Medicine and Rehabilitation

## 2018-09-27 NOTE — Telephone Encounter (Signed)
She has to know that it was same injection that I do, so if it helped should do another when she needs it but preferable no sooner than 3 months - as usual at her age we likely would do earlier.

## 2018-09-28 NOTE — Telephone Encounter (Signed)
Scheduled for hip injection on 7/13 at 1330.

## 2018-10-04 ENCOUNTER — Ambulatory Visit (INDEPENDENT_AMBULATORY_CARE_PROVIDER_SITE_OTHER): Payer: Medicare HMO | Admitting: Physical Medicine and Rehabilitation

## 2018-10-15 ENCOUNTER — Ambulatory Visit (INDEPENDENT_AMBULATORY_CARE_PROVIDER_SITE_OTHER): Payer: Medicare HMO | Admitting: Physical Medicine and Rehabilitation

## 2018-10-21 ENCOUNTER — Other Ambulatory Visit (INDEPENDENT_AMBULATORY_CARE_PROVIDER_SITE_OTHER): Payer: Self-pay | Admitting: Family Medicine

## 2018-10-26 ENCOUNTER — Other Ambulatory Visit: Payer: Self-pay | Admitting: Family Medicine

## 2018-10-26 MED ORDER — DICLOFENAC SODIUM 1 % TD GEL: 2.0000 g | Freq: Four times a day (QID) | TRANSDERMAL | 11 refills | Status: DC | PRN

## 2018-11-03 ENCOUNTER — Encounter: Payer: Self-pay | Admitting: Physical Medicine and Rehabilitation

## 2018-11-03 ENCOUNTER — Ambulatory Visit (INDEPENDENT_AMBULATORY_CARE_PROVIDER_SITE_OTHER): Payer: Medicare HMO | Admitting: Physical Medicine and Rehabilitation

## 2018-11-03 ENCOUNTER — Ambulatory Visit (INDEPENDENT_AMBULATORY_CARE_PROVIDER_SITE_OTHER): Payer: Medicare HMO

## 2018-11-03 ENCOUNTER — Other Ambulatory Visit: Payer: Self-pay

## 2018-11-03 ENCOUNTER — Telehealth: Payer: Self-pay | Admitting: Physical Medicine and Rehabilitation

## 2018-11-03 VITALS — BP 143/75 | HR 74 | Ht 65.0 in | Wt 134.0 lb

## 2018-11-03 DIAGNOSIS — M1612 Unilateral primary osteoarthritis, left hip: Secondary | ICD-10-CM

## 2018-11-03 DIAGNOSIS — M5442 Lumbago with sciatica, left side: Secondary | ICD-10-CM

## 2018-11-03 DIAGNOSIS — M5416 Radiculopathy, lumbar region: Secondary | ICD-10-CM

## 2018-11-03 DIAGNOSIS — M25552 Pain in left hip: Secondary | ICD-10-CM | POA: Diagnosis not present

## 2018-11-03 DIAGNOSIS — G8929 Other chronic pain: Secondary | ICD-10-CM

## 2018-11-03 NOTE — Telephone Encounter (Signed)
Auth No / Request ID [id number] Status Auto-Approved Decision Approved Effective Date 11/03/2018 Expiration Date 02/01/2019

## 2018-11-03 NOTE — Progress Notes (Signed)
 .  Numeric Pain Rating Scale and Functional Assessment Average Pain 8 Pain Right Now 7 My pain is intermittent, constant and aching Pain is worse with: unsure Pain improves with: medication   In the last MONTH (on 0-10 scale) has pain interfered with the following?  1. General activity like being  able to carry out your everyday physical activities such as walking, climbing stairs, carrying groceries, or moving a chair?  Rating(7)  2. Relation with others like being able to carry out your usual social activities and roles such as  activities at home, at work and in your community. Rating(7)  3. Enjoyment of life such that you have  been bothered by emotional problems such as feeling anxious, depressed or irritable?  Rating(5)

## 2018-11-08 ENCOUNTER — Ambulatory Visit (INDEPENDENT_AMBULATORY_CARE_PROVIDER_SITE_OTHER): Payer: Medicare HMO | Admitting: Family Medicine

## 2018-11-09 DIAGNOSIS — E038 Other specified hypothyroidism: Secondary | ICD-10-CM | POA: Diagnosis not present

## 2018-12-13 ENCOUNTER — Ambulatory Visit: Payer: Self-pay

## 2018-12-13 ENCOUNTER — Other Ambulatory Visit: Payer: Self-pay

## 2018-12-13 ENCOUNTER — Ambulatory Visit (INDEPENDENT_AMBULATORY_CARE_PROVIDER_SITE_OTHER): Payer: Medicare HMO | Admitting: Physical Medicine and Rehabilitation

## 2018-12-13 VITALS — BP 146/77 | HR 78

## 2018-12-13 DIAGNOSIS — M25552 Pain in left hip: Secondary | ICD-10-CM | POA: Diagnosis not present

## 2018-12-13 DIAGNOSIS — M5416 Radiculopathy, lumbar region: Secondary | ICD-10-CM

## 2018-12-13 MED ORDER — BETAMETHASONE SOD PHOS & ACET 6 (3-3) MG/ML IJ SUSP
12.0000 mg | Freq: Once | INTRAMUSCULAR | Status: AC
  Administered 2018-12-13: 12 mg

## 2018-12-13 NOTE — Progress Notes (Signed)
  Numeric Pain Rating Scale and Functional Assessment Average Pain 9   In the last MONTH (on 0-10 scale) has pain interfered with the following?  1. General activity like being  able to carry out your everyday physical activities such as walking, climbing stairs, carrying groceries, or moving a chair?  Rating(10)   +Driver, -BT, -Dye Allergies.

## 2018-12-13 NOTE — Progress Notes (Signed)
Sarah Manning - 85 y.o. female MRN [id number]  Date of birth: 1925-03-18  Office Visit Note: Visit Date: 12/13/2018 PCP: Crist Infante, MD Referred by: Crist Infante, MD  Subjective: Chief Complaint  Patient presents with  . Lower Back - Pain  . Left Hip - Pain   HPI: Sarah Manning is a 85 y.o. female who comes in today For planned left intra-articular hip injection and left L5 transforaminal epidural steroid injection.  Please see our prior evaluation and management note for further details and justification.  Plan will be MRI of the lumbar spine if no relief from injection.  Continue follow-up with Dr. Ninfa Linden for her hip.  ROS Otherwise per HPI.  Assessment & Plan: Visit Diagnoses:  1. Pain in left hip   2. Lumbar radiculopathy     Plan: No additional findings.   Meds & Orders:  Meds ordered this encounter  Medications  . betamethasone acetate-betamethasone sodium phosphate (CELESTONE) injection 12 mg    Orders Placed This Encounter  Procedures  . Large Joint Inj: L hip joint  . XR C-ARM NO REPORT  . Epidural Steroid injection    Follow-up: Return if symptoms worsen or fail to improve, for RI L-spine if no relief with injection.   Procedures: Large Joint Inj: L hip joint on 12/13/2018 1:36 PM Indications: diagnostic evaluation and pain Details: 22 G 3.5 in needle, fluoroscopy-guided anterior approach  Arthrogram: No  Medications: 80 mg triamcinolone acetonide 40 MG/ML; 4 mL bupivacaine 0.25 % Outcome: tolerated well, no immediate complications  There was excellent flow of contrast producing a partial arthrogram of the hip. The patient did have relief of symptoms during the anesthetic phase of the injection. Procedure, treatment alternatives, risks and benefits explained, specific risks discussed. Consent was given by the patient. Immediately prior to procedure a time out was called to verify the correct patient, procedure, equipment, support staff and site/side  marked as required. Patient was prepped and draped in the usual sterile fashion.      Lumbosacral Transforaminal Epidural Steroid Injection - Sub-Pedicular Approach with Fluoroscopic Guidance  Patient: Sarah Manning      Date of Birth: 03-19-1925 MRN: [id number] PCP: Crist Infante, MD      Visit Date: 12/13/2018   Universal Protocol:    Date/Time: 12/13/2018  Consent Given By: the patient  Position: PRONE  Additional Comments: Vital signs were monitored before and after the procedure. Patient was prepped and draped in the usual sterile fashion. The correct patient, procedure, and site was verified.   Injection Procedure Details:  Procedure Site One Meds Administered:  Meds ordered this encounter  Medications  . betamethasone acetate-betamethasone sodium phosphate (CELESTONE) injection 12 mg    Laterality: Left  Location/Site:  L5-S1  Needle size: 22 G  Needle type: Spinal  Needle Placement: Transforaminal  Findings:    -Comments: Excellent flow of contrast along the nerve and into the epidural space.  Procedure Details: After squaring off the end-plates to get a true AP view, the C-arm was positioned so that an oblique view of the foramen as noted above was visualized. The target area is just inferior to the "nose of the scotty dog" or sub pedicular. The soft tissues overlying this structure were infiltrated with 2-3 ml. of 1% Lidocaine without Epinephrine.  The spinal needle was inserted toward the target using a "trajectory" view along the fluoroscope beam.  Under AP and lateral visualization, the needle was advanced so it did not puncture dura and  was located close the 6 O'Clock position of the pedical in AP tracterory. Biplanar projections were used to confirm position. Aspiration was confirmed to be negative for CSF and/or blood. A 1-2 ml. volume of Isovue-250 was injected and flow of contrast was noted at each level. Radiographs were obtained for documentation  purposes.   After attaining the desired flow of contrast documented above, a 0.5 to 1.0 ml test dose of 0.25% Marcaine was injected into each respective transforaminal space.  The patient was observed for 90 seconds post injection.  After no sensory deficits were reported, and normal lower extremity motor function was noted,   the above injectate was administered so that equal amounts of the injectate were placed at each foramen (level) into the transforaminal epidural space.   Additional Comments:  The patient tolerated the procedure well Dressing: 2 x 2 sterile gauze and Band-Aid    Post-procedure details: Patient was observed during the procedure. Post-procedure instructions were reviewed.  Patient left the clinic in stable condition.    Clinical History: No specialty comments available.   She reports that she has never smoked. She has never used smokeless tobacco. No results for input(s): HGBA1C, LABURIC in the last 8760 hours.  Objective:  VS:  HT:    WT:   BMI:     BP:(!) 146/77  HR:78bpm  TEMP: ( )  RESP:  Physical Exam  Ortho Exam Imaging: Xr C-arm No Report  Result Date: 12/13/2018 Please see Notes tab for imaging impression.   Past Medical/Family/Surgical/Social History: Medications & Allergies reviewed per EMR, new medications updated. Patient Active Problem List   Diagnosis Date Noted  . Unilateral primary osteoarthritis, left hip 07/05/2018  . Primary osteoarthritis of left hip 10/24/2016  . Chronic cholecystitis with calculus 01/11/2015  . Symptomatic cholelithiasis 01/10/2015  . Diverticulosis of colon without hemorrhage 12/26/2014  . Hyperlipidemia 12/26/2014  . Hypothyroidism 12/26/2014   Past Medical History:  Diagnosis Date  . Arthritis   . Autoimmune disease ([facility])    positive ANA, double stranded DNA, anti-RO and anti- LA  . Cholelithiasis   . Colonic adenoma   . COPD (chronic obstructive pulmonary disease) ([facility])    non smoker  .  Diverticulosis   . Heart murmur   . Hypertension   . Hypothyroidism   . Osteoporosis    Family History  Problem Relation Age of Onset  . Heart disease Father   . Stomach cancer Brother   . Prostate cancer Brother   . Alzheimer's disease Mother   . Colon cancer Neg Hx    Past Surgical History:  Procedure Laterality Date  . CHOLECYSTECTOMY N/A 01/11/2015   Procedure: LAPAROSCOPIC CHOLECYSTECTOMY WITH INTRAOPERATIVE CHOLANGIOGRAM;  Surgeon: Armandina Gemma, MD;  Location: [facility];  Service: General;  Laterality: N/A;   Social History   Occupational History  . Occupation: Retired    Comment: retired from [employer]  . Smoking status: Never Smoker  . Smokeless tobacco: Never Used  Substance and Sexual Activity  . Alcohol use: Yes    Alcohol/week: 0.0 standard drinks    Comment: Occ. alcohol  . Drug use: No  . Sexual activity: Not on file

## 2018-12-14 ENCOUNTER — Encounter: Payer: Self-pay | Admitting: Physical Medicine and Rehabilitation

## 2018-12-14 MED ORDER — BUPIVACAINE HCL 0.25 % IJ SOLN
4.0000 mL | INTRAMUSCULAR | Status: AC | PRN
  Administered 2018-12-13: 4 mL via INTRA_ARTICULAR

## 2018-12-14 MED ORDER — TRIAMCINOLONE ACETONIDE 40 MG/ML IJ SUSP
80.0000 mg | INTRAMUSCULAR | Status: AC | PRN
  Administered 2018-12-13: 80 mg via INTRA_ARTICULAR

## 2018-12-14 NOTE — Progress Notes (Signed)
Sarah Manning - 85 y.o. female MRN [id number]  Date of birth: 07/17/1924  Office Visit Note: Visit Date: 11/03/2018 PCP: Crist Infante, MD Referred by: Crist Infante, MD  Subjective: Chief Complaint  Patient presents with   Left Hip - Pain   Left Leg - Pain   Left Foot - Pain   HPI: Sarah Manning is a 85 y.o. female who comes in today Reevaluation of low back left hip and leg pain.  By way of review patient has end-stage osteoarthritis of the left hip.  Prior intra-articular hip injection and diagnostic anesthetic hip arthrogram have been confirmative of pain generated from the hip.  She gets about 2 months relief usually with the injection which is borderline able to complete the injections we have been doing that because she has very few alternatives.  We have sort of discussed alternative use of possibly intra-articular phenol injection versus radiofrequency denervation which is something that is available but not a lot of good research.  She has seen Dr. Ninfa Linden from a surgical standpoint.  She comes in today after having had a recent hip injection which was not beneficial at all.  Her pain seems to be a little bit different and that she has left buttocks and left hip pain but no groin pain this time.  She also reports pain goes down to the foot at this point.  She reports this started a few months ago in terms of down to the foot.  Her pain is constant and she cannot really figure out what makes it worse or better.  Tylenol seems to help with the pain a little bit.  This is intermittent and constant and aching.  No focal weakness no paresthesia.  No history of prior spine surgery.  Review of Systems  Constitutional: Negative for chills, fever, malaise/fatigue and weight loss.  HENT: Negative for hearing loss and sinus pain.   Eyes: Negative for blurred vision, double vision and photophobia.  Respiratory: Negative for cough and shortness of breath.   Cardiovascular: Negative for  chest pain, palpitations and leg swelling.  Gastrointestinal: Negative for abdominal pain, nausea and vomiting.  Genitourinary: Negative for flank pain.  Musculoskeletal: Positive for back pain and joint pain. Negative for myalgias.  Skin: Negative for itching and rash.  Neurological: Negative for tremors, focal weakness and weakness.  Endo/Heme/Allergies: Negative.   Psychiatric/Behavioral: Negative for depression.  All other systems reviewed and are negative.  Otherwise per HPI.  Assessment & Plan: Visit Diagnoses:  1. Lumbar radiculopathy   2. Chronic left-sided low back pain with left-sided sciatica   3. Pain in left hip   4. Unilateral primary osteoarthritis, left hip     Plan: Findings:  Complicated picture of severe low back and left buttock and hip and leg pain which seems to be may be a new issue with radicular pain.  Prior history of therapeutic injection with intra-articular hip injections with fluoroscopic guidance.  Patient has end-stage left hip osteoarthritis.  Now having pain down the leg more of a radicular pain in L5 distribution.  X-ray shows by foraminal narrowing and straightening of the normal lordotic curvature with significant spondylosis.  I think at this point the best approach is to get the patient back for left hip injection and left L5 transforaminal injection just to see how much relief she gets.  If she does not get much relief we can have to look at MRI of the lumbar spine.  Should continue with current medications  and activity modification and exercises.  She is able to see Dr. Ninfa Linden for her hip we have made consultation there and she has seen him.    Meds & Orders: No orders of the defined types were placed in this encounter.   Orders Placed This Encounter  Procedures   XR Lumbar Spine 2-3 Views    Follow-up: Return for Left L5 transforaminal epidural steroid injection and left intra-articular hip injection..   Procedures: No procedures performed    No notes on file   Clinical History: No specialty comments available.   She reports that she has never smoked. She has never used smokeless tobacco. No results for input(s): HGBA1C, LABURIC in the last 8760 hours.  Objective:  VS:  HT:5\' 5"  (165.1 cm)    WT:134 lb (60.8 kg)   BMI:22.3     BP:(!) 143/75   HR:74bpm   TEMP: ( )   RESP:  Physical Exam Vitals signs and nursing note reviewed.  Constitutional:      General: She is not in acute distress.    Appearance: Normal appearance. She is well-developed. She is not ill-appearing.  HENT:     Head: Normocephalic and atraumatic.  Eyes:     Conjunctiva/sclera: Conjunctivae normal.     Pupils: Pupils are equal, round, and reactive to light.  Cardiovascular:     Rate and Rhythm: Normal rate.     Pulses: Normal pulses.  Pulmonary:     Effort: Pulmonary effort is normal.  Musculoskeletal:     Right lower leg: No edema.     Left lower leg: No edema.     Comments: Patient has some low back pain with extension and facet loading.  She has no pain over the greater trochanters.  She has pretty exquisite pain with rotation of the hip but not so much groin pain which is usually present.  Hip is very stiff on the left.  There is decreased range of motion.  She has good distal strength without clonus.  Negative slump test.  Skin:    General: Skin is warm and dry.     Findings: No erythema or rash.  Neurological:     General: No focal deficit present.     Mental Status: She is alert and oriented to person, place, and time.     Sensory: No sensory deficit.     Motor: No abnormal muscle tone.     Coordination: Coordination normal.     Gait: Gait normal.  Psychiatric:        Mood and Affect: Mood normal.        Behavior: Behavior normal.     Ortho Exam Imaging: AP and lateral lumbar x-ray dated today:AP and lateral lumbar spine shows end-stage left hip osteoarthritis with decreased left pelvic height compared to right and mild right concave  scoliosis centered at L2 to.  There is straightening of the normal lordotic curvature with flat back.  There is multilevel facet arthropathy with by foraminal narrowing at L5-S1.  There are no compression fractures noted. Past Medical/Family/Surgical/Social History: Medications & Allergies reviewed per EMR, new medications updated. Patient Active Problem List   Diagnosis Date Noted   Unilateral primary osteoarthritis, left hip 07/05/2018   Primary osteoarthritis of left hip 10/24/2016   Chronic cholecystitis with calculus 01/11/2015   Symptomatic cholelithiasis 01/10/2015   Diverticulosis of colon without hemorrhage 12/26/2014   Hyperlipidemia 12/26/2014   Hypothyroidism 12/26/2014   Past Medical History:  Diagnosis Date   Arthritis  Autoimmune disease ([facility])    positive ANA, double stranded DNA, anti-RO and anti- LA   Cholelithiasis    Colonic adenoma    COPD (chronic obstructive pulmonary disease) ([facility])    non smoker   Diverticulosis    Heart murmur    Hypertension    Hypothyroidism    Osteoporosis    Family History  Problem Relation Age of Onset   Heart disease Father    Stomach cancer Brother    Prostate cancer Brother    Alzheimer's disease Mother    Colon cancer Neg Hx    Past Surgical History:  Procedure Laterality Date   CHOLECYSTECTOMY N/A 01/11/2015   Procedure: LAPAROSCOPIC CHOLECYSTECTOMY WITH INTRAOPERATIVE CHOLANGIOGRAM;  Surgeon: Armandina Gemma, MD;  Location: [facility];  Service: General;  Laterality: N/A;   Social History   Occupational History   Occupation: Retired    Comment: retired from [employer]   Smoking status: Never Smoker   Smokeless tobacco: Never Used  Substance and Sexual Activity   Alcohol use: Yes    Alcohol/week: 0.0 standard drinks    Comment: Occ. alcohol   Drug use: No   Sexual activity: Not on file

## 2018-12-14 NOTE — Procedures (Signed)
Lumbosacral Transforaminal Epidural Steroid Injection - Sub-Pedicular Approach with Fluoroscopic Guidance  Patient: Sarah Manning      Date of Birth: 1924-08-01 MRN: [id number] PCP: Crist Infante, MD      Visit Date: 12/13/2018   Universal Protocol:    Date/Time: 12/13/2018  Consent Given By: the patient  Position: PRONE  Additional Comments: Vital signs were monitored before and after the procedure. Patient was prepped and draped in the usual sterile fashion. The correct patient, procedure, and site was verified.   Injection Procedure Details:  Procedure Site One Meds Administered:  Meds ordered this encounter  Medications  . betamethasone acetate-betamethasone sodium phosphate (CELESTONE) injection 12 mg    Laterality: Left  Location/Site:  L5-S1  Needle size: 22 G  Needle type: Spinal  Needle Placement: Transforaminal  Findings:    -Comments: Excellent flow of contrast along the nerve and into the epidural space.  Procedure Details: After squaring off the end-plates to get a true AP view, the C-arm was positioned so that an oblique view of the foramen as noted above was visualized. The target area is just inferior to the "nose of the scotty dog" or sub pedicular. The soft tissues overlying this structure were infiltrated with 2-3 ml. of 1% Lidocaine without Epinephrine.  The spinal needle was inserted toward the target using a "trajectory" view along the fluoroscope beam.  Under AP and lateral visualization, the needle was advanced so it did not puncture dura and was located close the 6 O'Clock position of the pedical in AP tracterory. Biplanar projections were used to confirm position. Aspiration was confirmed to be negative for CSF and/or blood. A 1-2 ml. volume of Isovue-250 was injected and flow of contrast was noted at each level. Radiographs were obtained for documentation purposes.   After attaining the desired flow of contrast documented above, a 0.5 to 1.0  ml test dose of 0.25% Marcaine was injected into each respective transforaminal space.  The patient was observed for 90 seconds post injection.  After no sensory deficits were reported, and normal lower extremity motor function was noted,   the above injectate was administered so that equal amounts of the injectate were placed at each foramen (level) into the transforaminal epidural space.   Additional Comments:  The patient tolerated the procedure well Dressing: 2 x 2 sterile gauze and Band-Aid    Post-procedure details: Patient was observed during the procedure. Post-procedure instructions were reviewed.  Patient left the clinic in stable condition.

## 2019-01-06 ENCOUNTER — Telehealth: Payer: Self-pay | Admitting: Physical Medicine and Rehabilitation

## 2019-01-06 NOTE — Telephone Encounter (Signed)
She will need to see PCP for pain mgt referral if injections not helping and not a surgical candidate, they may want to refer to pain mgt if needed.

## 2019-01-06 NOTE — Telephone Encounter (Signed)
Called patient to advise  °

## 2019-01-20 DIAGNOSIS — E038 Other specified hypothyroidism: Secondary | ICD-10-CM | POA: Diagnosis not present

## 2019-02-09 DIAGNOSIS — I10 Essential (primary) hypertension: Secondary | ICD-10-CM | POA: Diagnosis not present

## 2019-02-09 DIAGNOSIS — D649 Anemia, unspecified: Secondary | ICD-10-CM | POA: Diagnosis not present

## 2019-02-09 DIAGNOSIS — F418 Other specified anxiety disorders: Secondary | ICD-10-CM | POA: Diagnosis not present

## 2019-02-09 DIAGNOSIS — E038 Other specified hypothyroidism: Secondary | ICD-10-CM | POA: Diagnosis not present

## 2019-02-09 DIAGNOSIS — M810 Age-related osteoporosis without current pathological fracture: Secondary | ICD-10-CM | POA: Diagnosis not present

## 2019-02-09 DIAGNOSIS — M069 Rheumatoid arthritis, unspecified: Secondary | ICD-10-CM | POA: Diagnosis not present

## 2019-02-11 ENCOUNTER — Encounter (HOSPITAL_COMMUNITY): Payer: Self-pay

## 2019-02-11 ENCOUNTER — Emergency Department (HOSPITAL_COMMUNITY)
Admission: EM | Admit: 2019-02-11 | Discharge: 2019-02-12 | Disposition: A | Discharge status: Home / Self Care | Payer: Medicare HMO | Attending: Emergency Medicine | Admitting: Emergency Medicine

## 2019-02-11 ENCOUNTER — Emergency Department (HOSPITAL_COMMUNITY): Payer: Medicare HMO

## 2019-02-11 ENCOUNTER — Other Ambulatory Visit: Payer: Self-pay

## 2019-02-11 DIAGNOSIS — Y999 Unspecified external cause status: Secondary | ICD-10-CM | POA: Insufficient documentation

## 2019-02-11 DIAGNOSIS — W19XXXA Unspecified fall, initial encounter: Secondary | ICD-10-CM | POA: Diagnosis not present

## 2019-02-11 DIAGNOSIS — I10 Essential (primary) hypertension: Secondary | ICD-10-CM | POA: Diagnosis not present

## 2019-02-11 DIAGNOSIS — J449 Chronic obstructive pulmonary disease, unspecified: Secondary | ICD-10-CM | POA: Diagnosis not present

## 2019-02-11 DIAGNOSIS — S7002XA Contusion of left hip, initial encounter: Secondary | ICD-10-CM | POA: Insufficient documentation

## 2019-02-11 DIAGNOSIS — S0083XA Contusion of other part of head, initial encounter: Secondary | ICD-10-CM | POA: Diagnosis not present

## 2019-02-11 DIAGNOSIS — S79912A Unspecified injury of left hip, initial encounter: Secondary | ICD-10-CM | POA: Diagnosis not present

## 2019-02-11 DIAGNOSIS — Y9301 Activity, walking, marching and hiking: Secondary | ICD-10-CM | POA: Insufficient documentation

## 2019-02-11 DIAGNOSIS — Y92019 Unspecified place in single-family (private) house as the place of occurrence of the external cause: Secondary | ICD-10-CM | POA: Insufficient documentation

## 2019-02-11 DIAGNOSIS — Z79899 Other long term (current) drug therapy: Secondary | ICD-10-CM | POA: Diagnosis not present

## 2019-02-11 DIAGNOSIS — E039 Hypothyroidism, unspecified: Secondary | ICD-10-CM | POA: Insufficient documentation

## 2019-02-11 DIAGNOSIS — M25552 Pain in left hip: Secondary | ICD-10-CM | POA: Diagnosis not present

## 2019-02-11 DIAGNOSIS — S0081XA Abrasion of other part of head, initial encounter: Secondary | ICD-10-CM | POA: Diagnosis not present

## 2019-02-11 DIAGNOSIS — W01198A Fall on same level from slipping, tripping and stumbling with subsequent striking against other object, initial encounter: Secondary | ICD-10-CM | POA: Diagnosis not present

## 2019-02-11 DIAGNOSIS — R52 Pain, unspecified: Secondary | ICD-10-CM | POA: Diagnosis not present

## 2019-02-11 MED ORDER — FENTANYL CITRATE (PF) 100 MCG/2ML IJ SOLN
50.0000 ug | Freq: Once | INTRAMUSCULAR | Status: AC
  Administered 2019-02-11: 50 ug via INTRAVENOUS
  Filled 2019-02-11: qty 2

## 2019-02-11 NOTE — ED Triage Notes (Addendum)
Pt arrived via [facility]; pt s/p fall hitting coffee table on the left side at 5p; pt took 2 tramadol and 2 500mg  tylenol without relief approx 730p; EMS reports n obvious bruising; no blood thinner, some bruising  around bridge of nose where glasses sit; 164/78; 80; 98% on RA; CBG 179

## 2019-02-11 NOTE — ED Provider Notes (Signed)
[facility] EMERGENCY DEPARTMENT Provider Note   CSN: [id number] Arrival date & time: 02/11/19  2204     History   Chief Complaint Chief Complaint  Patient presents with  . Fall    HPI Sarah Manning is a 85 y.o. female.     HPI Patient presents after trip and fall.  At around 5:00 tonight was walking from her kitchen to her living room.  States she tripped over the carpet landed on her left side.  States she landed on the coffee table.  Also hit her nose.  No loss consciousness.  States the pain in her hip/pelvis has gotten worse.  She is not on anticoagulation.  No headache.  She took 2 of her Ultram's and 2 Tylenols and states the pain is gotten worse.  States she cannot walk now.  No pain in her back or neck. Past Medical History:  Diagnosis Date  . Arthritis   . Autoimmune disease ([place])    positive ANA, double stranded DNA, anti-RO and anti- LA  . Cholelithiasis   . Colonic adenoma   . COPD (chronic obstructive pulmonary disease) ([place])    non smoker  . Diverticulosis   . Heart murmur   . Hypertension   . Hypothyroidism   . Osteoporosis     Patient Active Problem List   Diagnosis Date Noted  . Unilateral primary osteoarthritis, left hip 07/05/2018  . Primary osteoarthritis of left hip 10/24/2016  . Chronic cholecystitis with calculus 01/11/2015  . Symptomatic cholelithiasis 01/10/2015  . Diverticulosis of colon without hemorrhage 12/26/2014  . Hyperlipidemia 12/26/2014  . Hypothyroidism 12/26/2014    Past Surgical History:  Procedure Laterality Date  . CHOLECYSTECTOMY N/A 01/11/2015   Procedure: LAPAROSCOPIC CHOLECYSTECTOMY WITH INTRAOPERATIVE CHOLANGIOGRAM;  Surgeon: Armandina Gemma, MD;  Location: [facility] ORS;  Service: General;  Laterality: N/A;     OB History   No obstetric history on file.      Home Medications    Prior to Admission medications   Medication Sig Start Date End Date Taking? Authorizing Provider  acetaminophen (TYLENOL)  500 MG tablet Take 1,000 mg by mouth See admin instructions. Take two tablets (1000 mg) by mouth twice daily, may also take two tablets (1000 mg) mid-day as needed for arthritis pain   Yes [provider]  atorvastatin (LIPITOR) 10 MG tablet Take 10 mg by mouth at bedtime.    Yes [provider]  clonazePAM (KLONOPIN) 0.5 MG tablet Take 0.5 mg by mouth 2 (two) times daily. 01/29/19  Yes [provider]  Cyanocobalamin (VITAMIN B-12 PO) Take 1 tablet by mouth every morning.   Yes [provider]  diclofenac sodium (VOLTAREN) 1 % GEL Apply 2 g topically 4 (four) times daily. 09/07/18  Yes Hilts, Legrand Como, MD  escitalopram (LEXAPRO) 20 MG tablet Take 20 mg by mouth every morning. 12/06/18  Yes [provider]  gabapentin (NEURONTIN) 100 MG capsule Take 200 mg by mouth 2 (two) times daily.    Yes [provider]  hydroxypropyl methylcellulose / hypromellose (ISOPTO TEARS / GONIOVISC) 2.5 % ophthalmic solution Place 1 drop into both eyes daily as needed for dry eyes.   Yes [provider]  levothyroxine (SYNTHROID) 125 MCG tablet Take 125 mcg by mouth daily before breakfast.   Yes [provider]  lisinopril (PRINIVIL,ZESTRIL) 20 MG tablet Take 20 mg by mouth every morning.  10/11/14  Yes [provider]  Multiple Vitamin (MULTIVITAMIN WITH MINERALS) TABS tablet Take 1  tablet by mouth every morning.   Yes [provider]  Omega-3 Fatty Acids (FISH OIL PO) Take 1 capsule by mouth every morning.   Yes [provider]  OVER THE COUNTER MEDICATION Take 1 tablet by mouth every morning. InstaFlex   Yes [provider]  pantoprazole (PROTONIX) 40 MG tablet One tablet orally qam 30 mins before meals Patient taking differently: Take 40 mg by mouth every morning.  01/31/15  Yes Hvozdovic, Lori P, PA-C  polyethylene glycol powder (GLYCOLAX/MIRALAX) powder Take 17 g by mouth daily as needed (constipation).    Yes  [provider]  traMADol (ULTRAM) 50 MG tablet Take 1 tablet (50 mg total) by mouth 3 (three) times daily as needed. for pain Patient taking differently: Take 50 mg by mouth 2 (two) times daily as needed (pain).  09/07/18  Yes Hilts, Legrand Como, MD  acetaminophen (TYLENOL) 325 MG tablet Take 2 tablets (650 mg total) by mouth every 6 (six) hours as needed for mild pain (or temp > 100). Patient not taking: Reported on 02/11/2019 01/12/15   Earnstine Regal, PA-C  acetaminophen-codeine (TYLENOL #3) 300-30 MG tablet Take 1-2 tablets by mouth every 8 (eight) hours as needed for moderate pain. Patient not taking: Reported on 02/11/2019 09/22/18   Mcarthur Rossetti, MD  diclofenac sodium (VOLTAREN) 1 % GEL Apply 2 g topically 4 (four) times daily as needed. Patient not taking: Reported on 02/11/2019 10/26/18   Hilts, Legrand Como, MD  Lidocaine-Hydrocortisone Ace 3-2.5 % KIT Place 1 application rectally 2 (two) times daily. Patient not taking: Reported on 02/11/2019 02/19/15   Mayer Camel, MD  traMADol (ULTRAM) 50 MG tablet Take 1 tablet (50 mg total) by mouth 2 (two) times daily. Patient not taking: Reported on 02/11/2019 02/19/18   Leandrew Koyanagi, MD  traMADol (ULTRAM) 50 MG tablet TAKE 1 TABLET BY MOUTH TWICE DAILY AS NEEDED FOR PAIN Patient not taking: Reported on 02/11/2019 04/19/18   Leandrew Koyanagi, MD  traMADol (ULTRAM) 50 MG tablet TAKE 1 TABLET BY MOUTH TWICE DAILY AS NEEDED FOR PAIN Patient not taking: Reported on 02/11/2019 04/27/18   Aundra Dubin, PA-C  traMADol (ULTRAM) 50 MG tablet TAKE 1 TABLET BY MOUTH TWICE DAILY AS NEEDED FOR PAIN Patient not taking: Reported on 02/11/2019 05/27/18   Aundra Dubin, PA-C  traMADol Veatrice Bourbon) 50 MG tablet Take 1 tablet by mouth twice daily as needed for pain Patient not taking: Reported on 02/11/2019 08/23/18   Mcarthur Rossetti, MD    Family History Family History  Problem Relation Age of Onset  . Heart disease Father   . Stomach cancer  Brother   . Prostate cancer Brother   . Alzheimer's disease Mother   . Colon cancer Neg Hx     Social History Social History   Tobacco Use  . Smoking status: Never Smoker  . Smokeless tobacco: Never Used  Substance Use Topics  . Alcohol use: Yes    Alcohol/week: 0.0 standard drinks    Comment: Occ. alcohol  . Drug use: No     Allergies   Aspirin, Amoxicillin, and Penicillins   Review of Systems Review of Systems  Constitutional: Negative for appetite change.  HENT: Negative for congestion.   Cardiovascular: Negative for chest pain.  Gastrointestinal: Negative for abdominal distention.  Genitourinary: Negative for flank pain.  Musculoskeletal:       Left hip and pelvic pain after fall.  Skin: Positive for wound.  Neurological: Negative for weakness and headaches.  Physical Exam Updated Vital Signs BP 136/61   Pulse 71   Resp 14   SpO2 95%   Physical Exam Vitals signs and nursing note reviewed.  HENT:     Head: Normocephalic.     Comments: Mild abrasion to bridge of nose.  No underlying bony tenderness.  Eye movements symmetric.  Minimal tenderness over left zygoma area but no deformity. Neck:     Musculoskeletal: Neck supple.  Cardiovascular:     Rate and Rhythm: Regular rhythm.  Pulmonary:     Breath sounds: No wheezing, rhonchi or rales.  Abdominal:     Tenderness: There is no abdominal tenderness.  Musculoskeletal:        General: No swelling or tenderness.     Comments: Some pain with movement of the left hip for mostly with flexion.  No lumbar tenderness.  No tenderness over pelvis.  Pelvis is stable.  Skin:    General: Skin is warm.     Capillary Refill: Capillary refill takes less than 2 seconds.  Neurological:     General: No focal deficit present.     Mental Status: She is alert and oriented to person, place, and time.      ED Treatments / Results  Labs (all labs ordered are listed, but only abnormal results are displayed) Labs  Reviewed - No data to display  EKG EKG Interpretation  Date/Time:  Friday February 11 2019 22:10:28 EDT Ventricular Rate:  76 PR Interval:    QRS Duration: 79 QT Interval:  394 QTC Calculation: 443 R Axis:   72 Text Interpretation:  Sinus rhythm Confirmed by Davonna Belling [phone]) on 02/11/2019 10:49:31 PM   Radiology No results found.  Procedures Procedures (including critical care time)  Medications Ordered in ED Medications  fentaNYL (SUBLIMAZE) injection 50 mcg (50 mcg Intravenous Given 02/11/19 2240)     Initial Impression / Assessment and Plan / ED Course  I have reviewed the triage vital signs and the nursing notes.  Pertinent labs & imaging results that were available during my care of the patient were reviewed by me and considered in my medical decision making (see chart for details).        Patient with mechanical fall.  Mild bruising to face.  Doubt bony or intracranial injury.  However left pelvic pain after the fall.  Uncontrolled with pain medicine at home.  X-ray will be done.  May need CT scan if x-ray does not show cause.  Care will be turned over to oncoming provider  Final Clinical Impressions(s) / ED Diagnoses   Final diagnoses:  Fall, initial encounter    ED Discharge Orders    None       Davonna Belling, MD 02/11/19 [phone]

## 2019-02-12 NOTE — ED Provider Notes (Signed)
Care assumed from Dr. Alvino Chapel at shift change.  Patient awaiting results of x-rays of her hip.  Patient fell earlier today and is having discomfort with ambulation.  These films have been interpreted as negative for fracture.  Patient was able to ambulate in the emergency department and will be discharged to home.  Diagnosis is hip contusion.  To follow-up with primary doctor if not improving.   Veryl Speak, MD 02/12/19 [phone]

## 2019-02-12 NOTE — ED Notes (Signed)
Pt discharged from ED; instructions provided; Pt encouraged to return to ED if symptoms worsen and to f/u with PCP; Pt verbalized understanding of all instructions 

## 2019-02-12 NOTE — Discharge Instructions (Addendum)
Continue taking tramadol as prescribed as needed for pain.  Weightbearing as tolerated.  Follow-up with primary doctor if symptoms not improving in the next 3 to 4 days.

## 2019-02-16 ENCOUNTER — Telehealth: Payer: Self-pay | Admitting: Radiology

## 2019-02-16 NOTE — Telephone Encounter (Signed)
If she had trauma I would prefer if [facility] could eval. If they think injection then happy to do or if they think follow up with me then ok.

## 2019-02-16 NOTE — Telephone Encounter (Signed)
I called and scheduled her with Artis Delay on 02/21/2019 @ 3:00pm

## 2019-02-16 NOTE — Telephone Encounter (Signed)
Patient states that she had a fall over the weekend and she would like to come in and see Dr. Ernestina Patches.  OV with you or Dr. Ninfa Linden?  Please advise.

## 2019-02-21 ENCOUNTER — Ambulatory Visit (INDEPENDENT_AMBULATORY_CARE_PROVIDER_SITE_OTHER): Payer: Medicare HMO

## 2019-02-21 ENCOUNTER — Ambulatory Visit (INDEPENDENT_AMBULATORY_CARE_PROVIDER_SITE_OTHER): Payer: Medicare HMO | Admitting: Physician Assistant

## 2019-02-21 ENCOUNTER — Encounter: Payer: Self-pay | Admitting: Physician Assistant

## 2019-02-21 ENCOUNTER — Other Ambulatory Visit: Payer: Self-pay

## 2019-02-21 VITALS — Ht 65.0 in | Wt 130.0 lb

## 2019-02-21 DIAGNOSIS — M25552 Pain in left hip: Secondary | ICD-10-CM

## 2019-02-21 DIAGNOSIS — M1612 Unilateral primary osteoarthritis, left hip: Secondary | ICD-10-CM | POA: Diagnosis not present

## 2019-02-21 NOTE — Progress Notes (Signed)
Office Visit Note   Patient: Sarah Manning           Date of Birth: [birth year]           MRN: [id number] Visit Date: 02/21/2019              Requested by: Crist Infante, MD [address] [place],  [place] [address] PCP: Crist Infante, MD   Assessment & Plan: Visit Diagnoses:  1. Pain in left hip   2. Unilateral primary osteoarthritis, left hip     Plan:  We will send her for a repeat intra-articular injection left hip.  She is weightbearing as tolerated and encouraged to use a walker to ambulate.  Did briefly discuss with her possible total hip replacement but would not recommend after hip injection for at least 3 months.  Questions were encouraged and answered at length.  Follow-up PRN  Follow-Up Instructions: Return Dr. Ernestina Patches.   Orders:  Orders Placed This Encounter  Procedures   XR Pelvis 1-2 Views   No orders of the defined types were placed in this encounter.     Procedures: No procedures performed   Clinical Data: No additional findings.   Subjective: Chief Complaint  Patient presents with   Left Leg - Pain    Fall 02/11/2019    HPI Sarah Manning comes in today due to left hip pain status post a fall on 02/11/2019.  She reports that she tripped over her her right foot.  No loss of consciousness no dizziness.  She was seen in the ED where radiographs were obtained I reviewed these films that showed no acute fracture.  She continues to have pain in the hip groin region.  She has known osteoarthritis of the left hip is had previous intra-articular injection which is giving her minimal relief for short period of time.  Review of Systems No fevers or chills.  Objective: Vital Signs: Ht 5\' 5"  (1.651 m)    Wt 130 lb (59 kg)    BMI 21.63 kg/m   Physical Exam General well-developed well-nourished female no acute distress Ortho Exam Left hip external rotation no significant pain.  Internal rotation causes severe pain.  Patient ambulates with a rolling walker  with shuffling like gait. Specialty Comments:  No specialty comments available.  Imaging: Xr Pelvis 1-2 Views  Result Date: 02/21/2019 AP pelvis lateral view of the left hip: No acute fracture.  Moderate severe arthritic changes left hip.  Hips well located.  Right hip is well-maintained.    PMFS History: Patient Active Problem List   Diagnosis Date Noted   Unilateral primary osteoarthritis, left hip 07/05/2018   Primary osteoarthritis of left hip 10/24/2016   Chronic cholecystitis with calculus 01/11/2015   Symptomatic cholelithiasis 01/10/2015   Diverticulosis of colon without hemorrhage 12/26/2014   Hyperlipidemia 12/26/2014   Hypothyroidism 12/26/2014   Past Medical History:  Diagnosis Date   Arthritis    Autoimmune disease ([place])    positive ANA, double stranded DNA, anti-RO and anti- LA   Cholelithiasis    Colonic adenoma    COPD (chronic obstructive pulmonary disease) ([place])    non smoker   Diverticulosis    Heart murmur    Hypertension    Hypothyroidism    Osteoporosis     Family History  Problem Relation Age of Onset   Heart disease Father    Stomach cancer Brother    Prostate cancer Brother    Alzheimer's disease Mother    Colon cancer Neg  Hx     Past Surgical History:  Procedure Laterality Date   CHOLECYSTECTOMY N/A 01/11/2015   Procedure: LAPAROSCOPIC CHOLECYSTECTOMY WITH INTRAOPERATIVE CHOLANGIOGRAM;  Surgeon: Armandina Gemma, MD;  Location: [facility];  Service: General;  Laterality: N/A;   Social History   Occupational History   Occupation: Retired    Comment: retired from [employer]   Smoking status: Never Smoker   Smokeless tobacco: Never Used  Substance and Sexual Activity   Alcohol use: Yes    Alcohol/week: 0.0 standard drinks    Comment: Occ. alcohol   Drug use: No   Sexual activity: Not on file

## 2019-03-17 ENCOUNTER — Ambulatory Visit (INDEPENDENT_AMBULATORY_CARE_PROVIDER_SITE_OTHER): Payer: Medicare HMO | Admitting: Physical Medicine and Rehabilitation

## 2019-03-17 ENCOUNTER — Ambulatory Visit: Payer: Self-pay

## 2019-03-17 ENCOUNTER — Encounter: Payer: Self-pay | Admitting: Physical Medicine and Rehabilitation

## 2019-03-17 ENCOUNTER — Other Ambulatory Visit: Payer: Self-pay

## 2019-03-17 DIAGNOSIS — M25552 Pain in left hip: Secondary | ICD-10-CM | POA: Diagnosis not present

## 2019-03-17 MED ORDER — TRIAMCINOLONE ACETONIDE 40 MG/ML IJ SUSP
80.0000 mg | INTRAMUSCULAR | Status: AC | PRN
  Administered 2019-03-17: 10:00:00 80 mg via INTRA_ARTICULAR

## 2019-03-17 MED ORDER — BUPIVACAINE HCL 0.25 % IJ SOLN
4.0000 mL | INTRAMUSCULAR | Status: AC | PRN
  Administered 2019-03-17: 10:00:00 4 mL via INTRA_ARTICULAR

## 2019-03-17 NOTE — Progress Notes (Signed)
   Sarah Manning - 85 y.o. female MRN [id number]  Date of birth: 1924/08/06  Office Visit Note: Visit Date: 03/17/2019 PCP: Crist Infante, MD Referred by: Crist Infante, MD  Subjective: Chief Complaint  Patient presents with  . Left Hip - Pain   HPI:  Sarah Manning is a 85 y.o. female who comes in today For the planned intra-articular left hip injection.  Patient has end-stage osteoarthritis of the left hip.  Last injection was many many months ago and she evidently was doing fairly well until a fall.  She was evaluated by Benita Stabile, P.A.-C.  She is having left hip and groin pain.  She is using a walker.  No radicular complaints or paresthesias.  Pain with rotation today.  ROS Otherwise per HPI.  Assessment & Plan: Visit Diagnoses:  1. Pain in left hip     Plan: No additional findings.   Meds & Orders: No orders of the defined types were placed in this encounter.   Orders Placed This Encounter  Procedures  . Large Joint Inj: L hip joint  . XR C-ARM NO REPORT    Follow-up: No follow-ups on file.   Procedures: Large Joint Inj: L hip joint on 03/17/2019 10:06 AM Indications: diagnostic evaluation and pain Details: 22 G 3.5 in needle, fluoroscopy-guided anterior approach  Arthrogram: No  Medications: 80 mg triamcinolone acetonide 40 MG/ML; 4 mL bupivacaine 0.25 % Outcome: tolerated well, no immediate complications  There was excellent flow of contrast producing a partial arthrogram of the hip. The patient did have relief of symptoms during the anesthetic phase of the injection. Procedure, treatment alternatives, risks and benefits explained, specific risks discussed. Consent was given by the patient. Immediately prior to procedure a time out was called to verify the correct patient, procedure, equipment, support staff and site/side marked as required. Patient was prepped and draped in the usual sterile fashion.      No notes on file   Clinical History: No specialty  comments available.     Objective:  VS:  HT:    WT:   BMI:     BP:   HR: bpm  TEMP: ( )  RESP:  Physical Exam  Ortho Exam Imaging: Xr C-arm No Report  Result Date: 03/17/2019 Please see Notes tab for imaging impression.

## 2019-03-17 NOTE — Progress Notes (Signed)
 .  Numeric Pain Rating Scale and Functional Assessment Average Pain 6   In the last MONTH (on 0-10 scale) has pain interfered with the following?  1. General activity like being  able to carry out your everyday physical activities such as walking, climbing stairs, carrying groceries, or moving a chair?  Rating(6)   -Dye Allergies.  

## 2019-04-25 DIAGNOSIS — M9903 Segmental and somatic dysfunction of lumbar region: Secondary | ICD-10-CM | POA: Diagnosis not present

## 2019-04-25 DIAGNOSIS — M25552 Pain in left hip: Secondary | ICD-10-CM | POA: Diagnosis not present

## 2019-04-25 DIAGNOSIS — M9905 Segmental and somatic dysfunction of pelvic region: Secondary | ICD-10-CM | POA: Diagnosis not present

## 2019-04-25 DIAGNOSIS — M5137 Other intervertebral disc degeneration, lumbosacral region: Secondary | ICD-10-CM | POA: Diagnosis not present

## 2019-04-26 DIAGNOSIS — M25552 Pain in left hip: Secondary | ICD-10-CM | POA: Diagnosis not present

## 2019-04-26 DIAGNOSIS — M9905 Segmental and somatic dysfunction of pelvic region: Secondary | ICD-10-CM | POA: Diagnosis not present

## 2019-04-26 DIAGNOSIS — M9903 Segmental and somatic dysfunction of lumbar region: Secondary | ICD-10-CM | POA: Diagnosis not present

## 2019-04-26 DIAGNOSIS — M5137 Other intervertebral disc degeneration, lumbosacral region: Secondary | ICD-10-CM | POA: Diagnosis not present

## 2019-04-27 DIAGNOSIS — M25552 Pain in left hip: Secondary | ICD-10-CM | POA: Diagnosis not present

## 2019-04-27 DIAGNOSIS — M5137 Other intervertebral disc degeneration, lumbosacral region: Secondary | ICD-10-CM | POA: Diagnosis not present

## 2019-04-27 DIAGNOSIS — M9905 Segmental and somatic dysfunction of pelvic region: Secondary | ICD-10-CM | POA: Diagnosis not present

## 2019-04-27 DIAGNOSIS — M9903 Segmental and somatic dysfunction of lumbar region: Secondary | ICD-10-CM | POA: Diagnosis not present

## 2019-05-02 DIAGNOSIS — M9903 Segmental and somatic dysfunction of lumbar region: Secondary | ICD-10-CM | POA: Diagnosis not present

## 2019-05-02 DIAGNOSIS — M25552 Pain in left hip: Secondary | ICD-10-CM | POA: Diagnosis not present

## 2019-05-02 DIAGNOSIS — M9905 Segmental and somatic dysfunction of pelvic region: Secondary | ICD-10-CM | POA: Diagnosis not present

## 2019-05-02 DIAGNOSIS — M5137 Other intervertebral disc degeneration, lumbosacral region: Secondary | ICD-10-CM | POA: Diagnosis not present

## 2019-05-03 DIAGNOSIS — M5137 Other intervertebral disc degeneration, lumbosacral region: Secondary | ICD-10-CM | POA: Diagnosis not present

## 2019-05-03 DIAGNOSIS — M25552 Pain in left hip: Secondary | ICD-10-CM | POA: Diagnosis not present

## 2019-05-03 DIAGNOSIS — M9905 Segmental and somatic dysfunction of pelvic region: Secondary | ICD-10-CM | POA: Diagnosis not present

## 2019-05-03 DIAGNOSIS — M9903 Segmental and somatic dysfunction of lumbar region: Secondary | ICD-10-CM | POA: Diagnosis not present

## 2019-05-05 DIAGNOSIS — M9903 Segmental and somatic dysfunction of lumbar region: Secondary | ICD-10-CM | POA: Diagnosis not present

## 2019-05-05 DIAGNOSIS — M9905 Segmental and somatic dysfunction of pelvic region: Secondary | ICD-10-CM | POA: Diagnosis not present

## 2019-05-05 DIAGNOSIS — M5137 Other intervertebral disc degeneration, lumbosacral region: Secondary | ICD-10-CM | POA: Diagnosis not present

## 2019-05-05 DIAGNOSIS — M25552 Pain in left hip: Secondary | ICD-10-CM | POA: Diagnosis not present

## 2019-05-09 DIAGNOSIS — M9905 Segmental and somatic dysfunction of pelvic region: Secondary | ICD-10-CM | POA: Diagnosis not present

## 2019-05-09 DIAGNOSIS — M25552 Pain in left hip: Secondary | ICD-10-CM | POA: Diagnosis not present

## 2019-05-09 DIAGNOSIS — M5137 Other intervertebral disc degeneration, lumbosacral region: Secondary | ICD-10-CM | POA: Diagnosis not present

## 2019-05-09 DIAGNOSIS — M9903 Segmental and somatic dysfunction of lumbar region: Secondary | ICD-10-CM | POA: Diagnosis not present

## 2019-05-10 DIAGNOSIS — M9903 Segmental and somatic dysfunction of lumbar region: Secondary | ICD-10-CM | POA: Diagnosis not present

## 2019-05-10 DIAGNOSIS — M9905 Segmental and somatic dysfunction of pelvic region: Secondary | ICD-10-CM | POA: Diagnosis not present

## 2019-05-10 DIAGNOSIS — M25552 Pain in left hip: Secondary | ICD-10-CM | POA: Diagnosis not present

## 2019-05-10 DIAGNOSIS — M5137 Other intervertebral disc degeneration, lumbosacral region: Secondary | ICD-10-CM | POA: Diagnosis not present

## 2019-05-12 DIAGNOSIS — M9905 Segmental and somatic dysfunction of pelvic region: Secondary | ICD-10-CM | POA: Diagnosis not present

## 2019-05-12 DIAGNOSIS — M9903 Segmental and somatic dysfunction of lumbar region: Secondary | ICD-10-CM | POA: Diagnosis not present

## 2019-05-12 DIAGNOSIS — M5137 Other intervertebral disc degeneration, lumbosacral region: Secondary | ICD-10-CM | POA: Diagnosis not present

## 2019-05-12 DIAGNOSIS — M25552 Pain in left hip: Secondary | ICD-10-CM | POA: Diagnosis not present

## 2019-05-16 DIAGNOSIS — M5137 Other intervertebral disc degeneration, lumbosacral region: Secondary | ICD-10-CM | POA: Diagnosis not present

## 2019-05-16 DIAGNOSIS — M9905 Segmental and somatic dysfunction of pelvic region: Secondary | ICD-10-CM | POA: Diagnosis not present

## 2019-05-16 DIAGNOSIS — M25552 Pain in left hip: Secondary | ICD-10-CM | POA: Diagnosis not present

## 2019-05-16 DIAGNOSIS — M9903 Segmental and somatic dysfunction of lumbar region: Secondary | ICD-10-CM | POA: Diagnosis not present

## 2019-05-17 DIAGNOSIS — M5137 Other intervertebral disc degeneration, lumbosacral region: Secondary | ICD-10-CM | POA: Diagnosis not present

## 2019-05-17 DIAGNOSIS — M9903 Segmental and somatic dysfunction of lumbar region: Secondary | ICD-10-CM | POA: Diagnosis not present

## 2019-05-17 DIAGNOSIS — M25552 Pain in left hip: Secondary | ICD-10-CM | POA: Diagnosis not present

## 2019-05-17 DIAGNOSIS — M9905 Segmental and somatic dysfunction of pelvic region: Secondary | ICD-10-CM | POA: Diagnosis not present

## 2019-05-19 DIAGNOSIS — M5137 Other intervertebral disc degeneration, lumbosacral region: Secondary | ICD-10-CM | POA: Diagnosis not present

## 2019-05-19 DIAGNOSIS — M25552 Pain in left hip: Secondary | ICD-10-CM | POA: Diagnosis not present

## 2019-05-19 DIAGNOSIS — M9905 Segmental and somatic dysfunction of pelvic region: Secondary | ICD-10-CM | POA: Diagnosis not present

## 2019-05-19 DIAGNOSIS — M9903 Segmental and somatic dysfunction of lumbar region: Secondary | ICD-10-CM | POA: Diagnosis not present

## 2019-05-23 DIAGNOSIS — M25552 Pain in left hip: Secondary | ICD-10-CM | POA: Diagnosis not present

## 2019-05-23 DIAGNOSIS — M9903 Segmental and somatic dysfunction of lumbar region: Secondary | ICD-10-CM | POA: Diagnosis not present

## 2019-05-23 DIAGNOSIS — M9905 Segmental and somatic dysfunction of pelvic region: Secondary | ICD-10-CM | POA: Diagnosis not present

## 2019-05-23 DIAGNOSIS — M5137 Other intervertebral disc degeneration, lumbosacral region: Secondary | ICD-10-CM | POA: Diagnosis not present

## 2019-05-25 ENCOUNTER — Telehealth: Payer: Self-pay | Admitting: Physical Medicine and Rehabilitation

## 2019-05-25 NOTE — Telephone Encounter (Signed)
ok 

## 2019-05-25 NOTE — Telephone Encounter (Signed)
Patient stated that she would be away from her home this afternoon for an appointment but would be back this evening. I advised her that our office would be closed until Monday, December 28 and that I woul call her back then.

## 2019-05-30 NOTE — Telephone Encounter (Signed)
Scheduled for 1/14 at 1415.

## 2019-06-06 DIAGNOSIS — M25552 Pain in left hip: Secondary | ICD-10-CM | POA: Diagnosis not present

## 2019-06-06 DIAGNOSIS — M5137 Other intervertebral disc degeneration, lumbosacral region: Secondary | ICD-10-CM | POA: Diagnosis not present

## 2019-06-06 DIAGNOSIS — M9905 Segmental and somatic dysfunction of pelvic region: Secondary | ICD-10-CM | POA: Diagnosis not present

## 2019-06-06 DIAGNOSIS — M9903 Segmental and somatic dysfunction of lumbar region: Secondary | ICD-10-CM | POA: Diagnosis not present

## 2019-06-15 DIAGNOSIS — F418 Other specified anxiety disorders: Secondary | ICD-10-CM | POA: Diagnosis not present

## 2019-06-15 DIAGNOSIS — M25552 Pain in left hip: Secondary | ICD-10-CM | POA: Diagnosis not present

## 2019-06-15 DIAGNOSIS — K219 Gastro-esophageal reflux disease without esophagitis: Secondary | ICD-10-CM | POA: Diagnosis not present

## 2019-06-15 DIAGNOSIS — M069 Rheumatoid arthritis, unspecified: Secondary | ICD-10-CM | POA: Diagnosis not present

## 2019-06-15 DIAGNOSIS — D649 Anemia, unspecified: Secondary | ICD-10-CM | POA: Diagnosis not present

## 2019-06-15 DIAGNOSIS — I10 Essential (primary) hypertension: Secondary | ICD-10-CM | POA: Diagnosis not present

## 2019-06-15 DIAGNOSIS — E039 Hypothyroidism, unspecified: Secondary | ICD-10-CM | POA: Diagnosis not present

## 2019-06-15 DIAGNOSIS — K5900 Constipation, unspecified: Secondary | ICD-10-CM | POA: Diagnosis not present

## 2019-06-15 DIAGNOSIS — M810 Age-related osteoporosis without current pathological fracture: Secondary | ICD-10-CM | POA: Diagnosis not present

## 2019-06-16 ENCOUNTER — Ambulatory Visit: Payer: Self-pay

## 2019-06-16 ENCOUNTER — Other Ambulatory Visit: Payer: Self-pay

## 2019-06-16 ENCOUNTER — Encounter: Payer: Self-pay | Admitting: Physical Medicine and Rehabilitation

## 2019-06-16 ENCOUNTER — Ambulatory Visit (INDEPENDENT_AMBULATORY_CARE_PROVIDER_SITE_OTHER): Payer: Medicare HMO | Admitting: Physical Medicine and Rehabilitation

## 2019-06-16 DIAGNOSIS — M25552 Pain in left hip: Secondary | ICD-10-CM

## 2019-06-16 NOTE — Progress Notes (Signed)
 .  Numeric Pain Rating Scale and Functional Assessment Average Pain 8   In the last MONTH (on 0-10 scale) has pain interfered with the following?  1. General activity like being  able to carry out your everyday physical activities such as walking, climbing stairs, carrying groceries, or moving a chair?  Rating(8)   +Driver, -BT, -Dye Allergies.  

## 2019-06-19 DIAGNOSIS — M25552 Pain in left hip: Secondary | ICD-10-CM | POA: Diagnosis not present

## 2019-06-20 DIAGNOSIS — M9903 Segmental and somatic dysfunction of lumbar region: Secondary | ICD-10-CM | POA: Diagnosis not present

## 2019-06-20 DIAGNOSIS — M25552 Pain in left hip: Secondary | ICD-10-CM | POA: Diagnosis not present

## 2019-06-20 DIAGNOSIS — M9905 Segmental and somatic dysfunction of pelvic region: Secondary | ICD-10-CM | POA: Diagnosis not present

## 2019-06-20 DIAGNOSIS — M5137 Other intervertebral disc degeneration, lumbosacral region: Secondary | ICD-10-CM | POA: Diagnosis not present

## 2019-06-22 ENCOUNTER — Ambulatory Visit: Payer: Medicare HMO | Admitting: Physician Assistant

## 2019-06-22 ENCOUNTER — Encounter: Payer: Self-pay | Admitting: Physician Assistant

## 2019-06-22 ENCOUNTER — Other Ambulatory Visit: Payer: Self-pay

## 2019-06-22 DIAGNOSIS — M1612 Unilateral primary osteoarthritis, left hip: Secondary | ICD-10-CM | POA: Diagnosis not present

## 2019-06-22 NOTE — Progress Notes (Signed)
Office Visit Note   Patient: Sarah Manning Manning           Date of Birth: [birth year]           MRN: [id number] Visit Date: 06/22/2019              Requested by: Sarah Manning Infante, MD [address] [place],  [place] [address] PCP: Sarah Manning Infante, MD   Assessment & Plan: Visit Diagnoses:  1. Unilateral primary osteoarthritis, left hip     Plan: Discussed with her increasing her tramadol to 1 tablet 4 times a day and using Tylenol up to 4 times daily.  Continue with a walker.  She states that the pain in his bothering her to the point that she would like to consider left total hip arthroplasty in the near future.  However due to COVID-19 unable to proceed with any elective total joints.  She is given a handout on anterior hip arthroplasty.  Questions were encouraged and answered by Dr. Ninfa Manning myself both the patient and her daughters present.  She understands the risks which include but are not limited to DVT/PE, wound healing problems, infection, prolonged pain worsening pain.  Proceed with surgery once elective surgeries are able to be scheduled.  However if she still been unable to undergo surgery in 3 months we will have her follow-up with [place] to discuss surgery again.  Follow-Up Instructions: Return in about 3 months (around 09/20/2019).   Orders:  No orders of the defined types were placed in this encounter.  No orders of the defined types were placed in this encounter.     Procedures: No procedures performed   Clinical Data: No additional findings.   Subjective: Chief Complaint  Patient presents with  . Left Hip - Pain    HPI Sarah Manning Manning returns today follow-up of her left hip status post intra-articular injection 06/16/2019.  She got no relief from this.  She continues to have pain in her groin that radiates down to her knee.  She ambulates with a rolling walker.  She is taking tramadol and Tylenol for the pain.  Only taking 1 tramadol per day.  Tried heat and also goes to the  chiropractor for the hip pain.  Patient has known end-stage arthritis of the left hip. Review of Systems No fevers chills shortness of breath chest pain  Objective: Vital Signs: There were no vitals taken for this visit.  Physical Exam Constitutional:      Appearance: She is not ill-appearing or diaphoretic.  Pulmonary:     Effort: Pulmonary effort is normal.  Neurological:     Mental Status: She is alert and oriented to person, place, and time.  Psychiatric:        Mood and Affect: Mood normal.     Ortho Exam Right hip full range of motion without pain.  Left hip limited external rotation and no internal rotation extreme pain with attempts of range of motion.  Ambulates with rolling walker  Specialty Comments:  No specialty comments available.  Imaging: No results found.   PMFS History: Patient Active Problem List   Diagnosis Date Noted  . Unilateral primary osteoarthritis, left hip 07/05/2018  . Primary osteoarthritis of left hip 10/24/2016  . Chronic cholecystitis with calculus 01/11/2015  . Symptomatic cholelithiasis 01/10/2015  . Diverticulosis of colon without hemorrhage 12/26/2014  . Hyperlipidemia 12/26/2014  . Hypothyroidism 12/26/2014   Past Medical History:  Diagnosis Date  . Arthritis   . Autoimmune disease ([place])  positive ANA, double stranded DNA, anti-RO and anti- LA  . Cholelithiasis   . Colonic adenoma   . COPD (chronic obstructive pulmonary disease) ([place])    non smoker  . Diverticulosis   . Heart murmur   . Hypertension   . Hypothyroidism   . Osteoporosis     Family History  Problem Relation Age of Onset  . Heart disease Father   . Stomach cancer Brother   . Prostate cancer Brother   . Alzheimer's disease Mother   . Colon cancer Neg Hx     Past Surgical History:  Procedure Laterality Date  . CHOLECYSTECTOMY N/A 01/11/2015   Procedure: LAPAROSCOPIC CHOLECYSTECTOMY WITH INTRAOPERATIVE CHOLANGIOGRAM;  Surgeon: Armandina Gemma, MD;   Location: [facility];  Service: General;  Laterality: N/A;   Social History   Occupational History  . Occupation: Retired    Comment: retired from [employer]  . Smoking status: Never Smoker  . Smokeless tobacco: Never Used  Substance and Sexual Activity  . Alcohol use: Yes    Alcohol/week: 0.0 standard drinks    Comment: Occ. alcohol  . Drug use: No  . Sexual activity: Not on file

## 2019-06-23 ENCOUNTER — Telehealth: Payer: Self-pay | Admitting: Radiology

## 2019-06-23 NOTE — Telephone Encounter (Signed)
Sent patient office note.

## 2019-07-08 ENCOUNTER — Other Ambulatory Visit: Payer: Self-pay

## 2019-07-20 ENCOUNTER — Other Ambulatory Visit: Payer: Self-pay | Admitting: Physician Assistant

## 2019-07-21 NOTE — Progress Notes (Signed)
[place], [place] - [id number] [place] [address] Phone: [phone] Fax: [phone]      Your procedure is scheduled on July 26, 2019.  Report to [facility] Main Entrance "A" at 10:00 A.M., and check in at the Admitting office.  Call this number if you have problems the morning of surgery:  [phone]  Call [phone] if you have any questions prior to your surgery date Monday-Friday 8am-4pm    Remember:  Do not eat  after midnight the night before your surgery  You may drink clear liquids until 9:00 A.M.  the morning of your surgery.   Clear liquids allowed are: Water, Non-Citrus Juices (without pulp), Carbonated Beverages, Clear Tea, Black Coffee Only, and Gatorade  Please complete your PRE-SURGERY ENSURE that was provided to you by 9:00 A.M. the morning of surgery.  Please, if able, drink it in one setting. DO NOT SIP.    Take these medicines the morning of surgery with A SIP OF WATER: escitalopram (LEXAPRO) gabapentin (NEURONTIN)  levothyroxine (SYNTHROID) pantoprazole (PROTONIX) acetaminophen (TYLENOL) clonazePAM (KLONOPIN) - as needed (ISOPTO TEARS / GONIOVISC) - eye drops as needed  As of today, STOP taking any Aspirin (unless otherwise instructed by your surgeon), Aleve, Naproxen, Ibuprofen, Motrin, Advil, Goody's, BC's, all herbal medications, fish oil, and all vitamins.    The Morning of Surgery  Do not wear jewelry, make-up or nail polish.  Do not wear lotions, powders, or perfumes or deodorant  Do not shave 48 hours prior to surgery.    Do not bring valuables to the hospital.  [facility] is not responsible for any belongings or valuables.  If you are a smoker, DO NOT Smoke 24 hours prior to surgery  If you wear a CPAP at night please bring your mask the morning of surgery   Remember that you must have someone to transport you home after your surgery, and remain with you for 24 hours  if you are discharged the same day.   Please bring cases for contacts, glasses, hearing aids, dentures or bridgework because it cannot be worn into surgery.    Leave your suitcase in the car.  After surgery it may be brought to your room.  For patients admitted to the hospital, discharge time will be determined by your treatment team.  Patients discharged the day of surgery will not be allowed to drive home.    Special instructions:   [place]- Preparing For Surgery  Before surgery, you can play an important role. Because skin is not sterile, your skin needs to be as free of germs as possible. You can reduce the number of germs on your skin by washing with CHG (chlorahexidine gluconate) Soap before surgery.  CHG is an antiseptic cleaner which kills germs and bonds with the skin to continue killing germs even after washing.    Oral Hygiene is also important to reduce your risk of infection.  Remember - BRUSH YOUR TEETH THE MORNING OF SURGERY WITH YOUR REGULAR TOOTHPASTE  Please do not use if you have an allergy to CHG or antibacterial soaps. If your skin becomes reddened/irritated stop using the CHG.  Do not shave (including legs and underarms) for at least 48 hours prior to first CHG shower. It is OK to shave your face.  Please follow these instructions carefully.   1. Shower the NIGHT BEFORE SURGERY and the MORNING OF SURGERY with CHG Soap.   2. If you chose to wash  your hair, wash your hair first as usual with your normal shampoo.  3. After you shampoo, rinse your hair and body thoroughly to remove the shampoo.  4. Use CHG as you would any other liquid soap. You can apply CHG directly to the skin and wash gently with a scrungie or a clean washcloth.   5. Apply the CHG Soap to your body ONLY FROM THE NECK DOWN.  Do not use on open wounds or open sores. Avoid contact with your eyes, ears, mouth and genitals (private parts). Wash Face and genitals (private parts)  with your normal  soap.   6. Wash thoroughly, paying special attention to the area where your surgery will be performed.  7. Thoroughly rinse your body with warm water from the neck down.  8. DO NOT shower/wash with your normal soap after using and rinsing off the CHG Soap.  9. Pat yourself dry with a CLEAN TOWEL.  10. Wear CLEAN PAJAMAS to bed the night before surgery, wear comfortable clothes the morning of surgery  11. Place CLEAN SHEETS on your bed the night of your first shower and DO NOT SLEEP WITH PETS.    Day of Surgery:  Please shower the morning of surgery with the CHG soap Do not apply any deodorants/lotions. Please wear clean clothes to the hospital/surgery center.   Remember to brush your teeth WITH YOUR REGULAR TOOTHPASTE.   Please read over the following fact sheets that you were given.

## 2019-07-22 ENCOUNTER — Other Ambulatory Visit (HOSPITAL_COMMUNITY)
Admission: RE | Admit: 2019-07-22 | Discharge: 2019-07-22 | Disposition: A | Discharge status: Home / Self Care | Payer: Medicare HMO | Source: Ambulatory Visit | Attending: Orthopaedic Surgery | Admitting: Orthopaedic Surgery

## 2019-07-22 ENCOUNTER — Encounter (HOSPITAL_COMMUNITY)
Admission: RE | Admit: 2019-07-22 | Discharge: 2019-07-22 | Disposition: A | Discharge status: Home / Self Care | Payer: Medicare HMO | Source: Ambulatory Visit | Attending: Orthopaedic Surgery | Admitting: Orthopaedic Surgery

## 2019-07-22 ENCOUNTER — Encounter (HOSPITAL_COMMUNITY): Payer: Self-pay

## 2019-07-22 ENCOUNTER — Telehealth: Payer: Self-pay | Admitting: *Deleted

## 2019-07-22 ENCOUNTER — Other Ambulatory Visit: Payer: Self-pay

## 2019-07-22 DIAGNOSIS — Z20822 Contact with and (suspected) exposure to covid-19: Secondary | ICD-10-CM | POA: Diagnosis not present

## 2019-07-22 DIAGNOSIS — Z01812 Encounter for preprocedural laboratory examination: Secondary | ICD-10-CM | POA: Insufficient documentation

## 2019-07-22 HISTORY — DX: Depression, unspecified: F32.A

## 2019-07-22 LAB — CBC
HCT: 37.1 % (ref 36.0–46.0)
Hemoglobin: 11.8 g/dL — ABNORMAL LOW (ref 12.0–15.0)
MCH: 31.4 pg (ref 26.0–34.0)
MCHC: 31.8 g/dL (ref 30.0–36.0)
MCV: 98.7 fL (ref 80.0–100.0)
Platelets: 276 10*3/uL (ref 150–400)
RBC: 3.76 MIL/uL — ABNORMAL LOW (ref 3.87–5.11)
RDW: 12.3 % (ref 11.5–15.5)
WBC: 5.1 10*3/uL (ref 4.0–10.5)
nRBC: 0 % (ref 0.0–0.2)

## 2019-07-22 LAB — BASIC METABOLIC PANEL
Anion gap: 8 (ref 5–15)
BUN: 10 mg/dL (ref 8–23)
CO2: 31 mmol/L (ref 22–32)
Calcium: 9.1 mg/dL (ref 8.9–10.3)
Chloride: 99 mmol/L (ref 98–111)
Creatinine, Ser: 0.79 mg/dL (ref 0.44–1.00)
GFR calc Af Amer: >60 mL/min (ref >60)
GFR calc non Af Amer: >60 mL/min (ref >60)
Glucose, Bld: 103 mg/dL — ABNORMAL HIGH (ref 70–99)
Potassium: 3.8 mmol/L (ref 3.5–5.1)
Sodium: 138 mmol/L (ref 135–145)

## 2019-07-22 LAB — SURGICAL PCR SCREEN
MRSA, PCR: NEGATIVE
Staphylococcus aureus: NEGATIVE

## 2019-07-22 LAB — SARS CORONAVIRUS 2 (TAT 6-24 HRS): SARS Coronavirus 2: NEGATIVE

## 2019-07-22 NOTE — Telephone Encounter (Signed)
Ortho bundle pre-op call completed. 

## 2019-07-22 NOTE — Care Plan (Signed)
Patient is an Ortho bundle patient who will be having surgery on Tuesday, 07/26/19 with Dr. Ninfa Linden for a Left THA. Reviewed Ortho bundle program through [facility] and patient is agreeable. Reviewed all pre- and post-op instructions and provided patient ability to ask questions. Patient has a spouse as well as son and daughter that will be assisting her at home after surgery. Anticipate HHPT will be needed after surgery. Choice provided and referral made to [facility]. Patient has a FWW and elevated toilet seats. No DME needed. Will continue to follow for all CM needs.

## 2019-07-22 NOTE — Progress Notes (Signed)
PCP:  Crist Infante, MD Cardiologist:  Denies  EKG:  02/11/19 CXR:  N/A ECHO:  12/16/10 Stress Test:  Denies Cardiac Cath:  Denies   Covid test 07/22/19  Patient denies shortness of breath, fever, cough, and chest pain at PAT appointment.  Patient verbalized understanding of instructions provided today at the PAT appointment.  Patient asked to review instructions at home and day of surgery.

## 2019-07-26 ENCOUNTER — Ambulatory Visit (HOSPITAL_COMMUNITY): Payer: Medicare HMO | Admitting: Anesthesiology

## 2019-07-26 ENCOUNTER — Observation Stay (HOSPITAL_COMMUNITY)
Admission: RE | Admit: 2019-07-26 | Discharge: 2019-07-27 | Disposition: A | Discharge status: Home / Self Care | Payer: Medicare HMO | Source: Ambulatory Visit | Attending: Orthopaedic Surgery | Admitting: Orthopaedic Surgery

## 2019-07-26 ENCOUNTER — Ambulatory Visit (HOSPITAL_COMMUNITY): Payer: Medicare HMO

## 2019-07-26 ENCOUNTER — Encounter (HOSPITAL_COMMUNITY)
Admission: RE | Disposition: A | Discharge status: Home / Self Care | Payer: Self-pay | Source: Ambulatory Visit | Attending: Orthopaedic Surgery

## 2019-07-26 ENCOUNTER — Encounter (HOSPITAL_COMMUNITY): Payer: Self-pay | Admitting: Orthopaedic Surgery

## 2019-07-26 ENCOUNTER — Observation Stay (HOSPITAL_COMMUNITY): Payer: Medicare HMO

## 2019-07-26 ENCOUNTER — Other Ambulatory Visit: Payer: Self-pay

## 2019-07-26 DIAGNOSIS — M359 Systemic involvement of connective tissue, unspecified: Secondary | ICD-10-CM | POA: Diagnosis not present

## 2019-07-26 DIAGNOSIS — M1612 Unilateral primary osteoarthritis, left hip: Principal | ICD-10-CM | POA: Insufficient documentation

## 2019-07-26 DIAGNOSIS — Z86010 Personal history of colonic polyps: Secondary | ICD-10-CM | POA: Diagnosis not present

## 2019-07-26 DIAGNOSIS — Z820 Family history of epilepsy and other diseases of the nervous system: Secondary | ICD-10-CM | POA: Insufficient documentation

## 2019-07-26 DIAGNOSIS — Z471 Aftercare following joint replacement surgery: Secondary | ICD-10-CM | POA: Diagnosis not present

## 2019-07-26 DIAGNOSIS — Z79899 Other long term (current) drug therapy: Secondary | ICD-10-CM | POA: Insufficient documentation

## 2019-07-26 DIAGNOSIS — Z96642 Presence of left artificial hip joint: Secondary | ICD-10-CM

## 2019-07-26 DIAGNOSIS — F329 Major depressive disorder, single episode, unspecified: Secondary | ICD-10-CM | POA: Insufficient documentation

## 2019-07-26 DIAGNOSIS — R011 Cardiac murmur, unspecified: Secondary | ICD-10-CM | POA: Diagnosis not present

## 2019-07-26 DIAGNOSIS — J449 Chronic obstructive pulmonary disease, unspecified: Secondary | ICD-10-CM | POA: Diagnosis not present

## 2019-07-26 DIAGNOSIS — K5790 Diverticulosis of intestine, part unspecified, without perforation or abscess without bleeding: Secondary | ICD-10-CM | POA: Diagnosis not present

## 2019-07-26 DIAGNOSIS — Z9049 Acquired absence of other specified parts of digestive tract: Secondary | ICD-10-CM | POA: Insufficient documentation

## 2019-07-26 DIAGNOSIS — Z880 Allergy status to penicillin: Secondary | ICD-10-CM | POA: Diagnosis not present

## 2019-07-26 DIAGNOSIS — Z419 Encounter for procedure for purposes other than remedying health state, unspecified: Secondary | ICD-10-CM

## 2019-07-26 DIAGNOSIS — E785 Hyperlipidemia, unspecified: Secondary | ICD-10-CM | POA: Insufficient documentation

## 2019-07-26 DIAGNOSIS — E039 Hypothyroidism, unspecified: Secondary | ICD-10-CM | POA: Insufficient documentation

## 2019-07-26 DIAGNOSIS — I10 Essential (primary) hypertension: Secondary | ICD-10-CM | POA: Insufficient documentation

## 2019-07-26 DIAGNOSIS — Z8249 Family history of ischemic heart disease and other diseases of the circulatory system: Secondary | ICD-10-CM | POA: Diagnosis not present

## 2019-07-26 DIAGNOSIS — Z886 Allergy status to analgesic agent status: Secondary | ICD-10-CM | POA: Diagnosis not present

## 2019-07-26 DIAGNOSIS — Z800 Family history of malignant neoplasm of digestive organs: Secondary | ICD-10-CM | POA: Insufficient documentation

## 2019-07-26 DIAGNOSIS — Z8042 Family history of malignant neoplasm of prostate: Secondary | ICD-10-CM | POA: Insufficient documentation

## 2019-07-26 DIAGNOSIS — Z7982 Long term (current) use of aspirin: Secondary | ICD-10-CM | POA: Insufficient documentation

## 2019-07-26 DIAGNOSIS — D638 Anemia in other chronic diseases classified elsewhere: Secondary | ICD-10-CM | POA: Diagnosis not present

## 2019-07-26 HISTORY — PX: TOTAL HIP ARTHROPLASTY: SHX124

## 2019-07-26 SURGERY — ARTHROPLASTY, HIP, TOTAL, ANTERIOR APPROACH
Anesthesia: Monitor Anesthesia Care | Site: Hip | Laterality: Left

## 2019-07-26 MED ORDER — FENTANYL CITRATE (PF) 250 MCG/5ML IJ SOLN
INTRAMUSCULAR | Status: AC
  Filled 2019-07-26: qty 5

## 2019-07-26 MED ORDER — HYPROMELLOSE (GONIOSCOPIC) 2.5 % OP SOLN
1.0000 [drp] | Freq: Three times a day (TID) | OPHTHALMIC | Status: DC | PRN
  Filled 2019-07-26: qty 15

## 2019-07-26 MED ORDER — PROPOFOL 10 MG/ML IV BOLUS
INTRAVENOUS | Status: AC
  Filled 2019-07-26: qty 20

## 2019-07-26 MED ORDER — ASPIRIN 81 MG PO CHEW
81.0000 mg | CHEWABLE_TABLET | Freq: Two times a day (BID) | ORAL | Status: DC
  Administered 2019-07-27: 81 mg via ORAL
  Filled 2019-07-26 (×2): qty 1

## 2019-07-26 MED ORDER — DIPHENHYDRAMINE HCL 12.5 MG/5ML PO ELIX: 12.5000 mg | ORAL_SOLUTION | ORAL | Status: DC | PRN

## 2019-07-26 MED ORDER — ONDANSETRON HCL 4 MG/2ML IJ SOLN: 4.0000 mg | Freq: Once | INTRAMUSCULAR | Status: DC | PRN

## 2019-07-26 MED ORDER — ONDANSETRON HCL 4 MG/2ML IJ SOLN
INTRAMUSCULAR | Status: DC | PRN
  Administered 2019-07-26: 4 mg via INTRAVENOUS

## 2019-07-26 MED ORDER — 0.9 % SODIUM CHLORIDE (POUR BTL) OPTIME
TOPICAL | Status: DC | PRN
  Administered 2019-07-26: 13:00:00 1000 mL

## 2019-07-26 MED ORDER — ONDANSETRON HCL 4 MG/2ML IJ SOLN: 4.0000 mg | Freq: Four times a day (QID) | INTRAMUSCULAR | Status: DC | PRN

## 2019-07-26 MED ORDER — ATORVASTATIN CALCIUM 10 MG PO TABS
10.0000 mg | ORAL_TABLET | Freq: Every day | ORAL | Status: DC
  Administered 2019-07-26: 10 mg via ORAL
  Filled 2019-07-26: qty 1

## 2019-07-26 MED ORDER — TRANEXAMIC ACID-NACL 1000-0.7 MG/100ML-% IV SOLN
1000.0000 mg | INTRAVENOUS | Status: AC
  Administered 2019-07-26: 1000 mg via INTRAVENOUS

## 2019-07-26 MED ORDER — CLINDAMYCIN PHOSPHATE 900 MG/50ML IV SOLN
INTRAVENOUS | Status: AC
  Filled 2019-07-26: qty 50

## 2019-07-26 MED ORDER — TRANEXAMIC ACID-NACL 1000-0.7 MG/100ML-% IV SOLN
INTRAVENOUS | Status: AC
  Filled 2019-07-26: qty 100

## 2019-07-26 MED ORDER — ADULT MULTIVITAMIN W/MINERALS CH
2.0000 | ORAL_TABLET | Freq: Every day | ORAL | Status: DC
  Administered 2019-07-27: 2 via ORAL
  Filled 2019-07-26: qty 2

## 2019-07-26 MED ORDER — PANTOPRAZOLE SODIUM 40 MG PO TBEC
40.0000 mg | DELAYED_RELEASE_TABLET | Freq: Every day | ORAL | Status: DC
  Administered 2019-07-27: 40 mg via ORAL
  Filled 2019-07-26: qty 1

## 2019-07-26 MED ORDER — METHOCARBAMOL 500 MG PO TABS: 500.0000 mg | ORAL_TABLET | Freq: Four times a day (QID) | ORAL | Status: DC | PRN

## 2019-07-26 MED ORDER — CLINDAMYCIN PHOSPHATE 600 MG/50ML IV SOLN
600.0000 mg | Freq: Four times a day (QID) | INTRAVENOUS | Status: AC
  Administered 2019-07-26 – 2019-07-27 (×2): 600 mg via INTRAVENOUS
  Filled 2019-07-26 (×2): qty 50

## 2019-07-26 MED ORDER — LISINOPRIL 20 MG PO TABS
20.0000 mg | ORAL_TABLET | Freq: Every day | ORAL | Status: DC
  Filled 2019-07-26: qty 1

## 2019-07-26 MED ORDER — CHLORHEXIDINE GLUCONATE 4 % EX LIQD: 60.0000 mL | Freq: Once | CUTANEOUS | Status: DC

## 2019-07-26 MED ORDER — ACETAMINOPHEN 325 MG PO TABS: 325.0000 mg | ORAL_TABLET | Freq: Four times a day (QID) | ORAL | Status: DC | PRN

## 2019-07-26 MED ORDER — TRAMADOL HCL 50 MG PO TABS
50.0000 mg | ORAL_TABLET | Freq: Four times a day (QID) | ORAL | Status: DC
  Administered 2019-07-26 – 2019-07-27 (×4): 50 mg via ORAL
  Filled 2019-07-26 (×4): qty 1

## 2019-07-26 MED ORDER — METOCLOPRAMIDE HCL 5 MG/ML IJ SOLN: 5.0000 mg | Freq: Three times a day (TID) | INTRAMUSCULAR | Status: DC | PRN

## 2019-07-26 MED ORDER — OXYCODONE HCL 5 MG/5ML PO SOLN: 5.0000 mg | Freq: Once | ORAL | Status: DC | PRN

## 2019-07-26 MED ORDER — GABAPENTIN 100 MG PO CAPS
200.0000 mg | ORAL_CAPSULE | Freq: Two times a day (BID) | ORAL | Status: DC
  Administered 2019-07-26 – 2019-07-27 (×2): 200 mg via ORAL
  Filled 2019-07-26 (×2): qty 2

## 2019-07-26 MED ORDER — ALUM & MAG HYDROXIDE-SIMETH 200-200-20 MG/5ML PO SUSP: 30.0000 mL | ORAL | Status: DC | PRN

## 2019-07-26 MED ORDER — ONDANSETRON HCL 4 MG PO TABS: 4.0000 mg | ORAL_TABLET | Freq: Four times a day (QID) | ORAL | Status: DC | PRN

## 2019-07-26 MED ORDER — OXYCODONE HCL 5 MG PO TABS: 5.0000 mg | ORAL_TABLET | Freq: Once | ORAL | Status: DC | PRN

## 2019-07-26 MED ORDER — DOCUSATE SODIUM 100 MG PO CAPS
100.0000 mg | ORAL_CAPSULE | Freq: Two times a day (BID) | ORAL | Status: DC
  Administered 2019-07-26 – 2019-07-27 (×2): 100 mg via ORAL
  Filled 2019-07-26 (×2): qty 1

## 2019-07-26 MED ORDER — BUPIVACAINE IN DEXTROSE 0.75-8.25 % IT SOLN
INTRATHECAL | Status: DC | PRN
  Administered 2019-07-26: 1.6 mL via INTRATHECAL

## 2019-07-26 MED ORDER — METOCLOPRAMIDE HCL 5 MG PO TABS: 5.0000 mg | ORAL_TABLET | Freq: Three times a day (TID) | ORAL | Status: DC | PRN

## 2019-07-26 MED ORDER — CLINDAMYCIN PHOSPHATE 900 MG/50ML IV SOLN
900.0000 mg | INTRAVENOUS | Status: AC
  Administered 2019-07-26: 900 mg via INTRAVENOUS

## 2019-07-26 MED ORDER — POVIDONE-IODINE 10 % EX SWAB: 2.0000 "application " | Freq: Once | CUTANEOUS | Status: DC

## 2019-07-26 MED ORDER — SODIUM CHLORIDE 0.9 % IV SOLN: INTRAVENOUS | Status: DC

## 2019-07-26 MED ORDER — HYDROCODONE-ACETAMINOPHEN 7.5-325 MG PO TABS
1.0000 | ORAL_TABLET | ORAL | Status: DC | PRN
  Administered 2019-07-26: 21:00:00 1 via ORAL
  Filled 2019-07-26: qty 1

## 2019-07-26 MED ORDER — LACTATED RINGERS IV SOLN: INTRAVENOUS | Status: DC

## 2019-07-26 MED ORDER — PHENOL 1.4 % MT LIQD: 1.0000 | OROMUCOSAL | Status: DC | PRN

## 2019-07-26 MED ORDER — LEVOTHYROXINE SODIUM 25 MCG PO TABS
125.0000 ug | ORAL_TABLET | Freq: Every day | ORAL | Status: DC
  Administered 2019-07-27: 125 ug via ORAL
  Filled 2019-07-26: qty 1

## 2019-07-26 MED ORDER — FENTANYL CITRATE (PF) 100 MCG/2ML IJ SOLN: 25.0000 ug | INTRAMUSCULAR | Status: DC | PRN

## 2019-07-26 MED ORDER — MENTHOL 3 MG MT LOZG: 1.0000 | LOZENGE | OROMUCOSAL | Status: DC | PRN

## 2019-07-26 MED ORDER — MORPHINE SULFATE (PF) 2 MG/ML IV SOLN: 0.5000 mg | INTRAVENOUS | Status: DC | PRN

## 2019-07-26 MED ORDER — ONDANSETRON HCL 4 MG/2ML IJ SOLN
INTRAMUSCULAR | Status: AC
  Filled 2019-07-26: qty 2

## 2019-07-26 MED ORDER — PROPOFOL 500 MG/50ML IV EMUL
INTRAVENOUS | Status: DC | PRN
  Administered 2019-07-26: 35 ug/kg/min via INTRAVENOUS

## 2019-07-26 MED ORDER — METHOCARBAMOL 1000 MG/10ML IJ SOLN
500.0000 mg | Freq: Four times a day (QID) | INTRAVENOUS | Status: DC | PRN
  Filled 2019-07-26: qty 5

## 2019-07-26 MED ORDER — HYDROCODONE-ACETAMINOPHEN 5-325 MG PO TABS
1.0000 | ORAL_TABLET | ORAL | Status: DC | PRN
  Administered 2019-07-26: 1 via ORAL
  Filled 2019-07-26: qty 1

## 2019-07-26 MED ORDER — ESCITALOPRAM OXALATE 10 MG PO TABS
20.0000 mg | ORAL_TABLET | Freq: Every day | ORAL | Status: DC
  Administered 2019-07-27: 20 mg via ORAL
  Filled 2019-07-26: qty 2

## 2019-07-26 MED ORDER — CLONAZEPAM 0.5 MG PO TABS
0.5000 mg | ORAL_TABLET | Freq: Two times a day (BID) | ORAL | Status: DC | PRN
  Administered 2019-07-26: 0.5 mg via ORAL
  Filled 2019-07-26: qty 1

## 2019-07-26 SURGICAL SUPPLY — 51 items
BLADE CLIPPER SURG (BLADE) IMPLANT
BLADE SAW SGTL 18X1.27X75 (BLADE) ×2 IMPLANT
BLADE SAW SGTL 18X1.27X75MM (BLADE) ×1
COVER SURGICAL LIGHT HANDLE (MISCELLANEOUS) ×3 IMPLANT
CUP SECTOR GRIPTON 50MM (Cup) ×2 IMPLANT
DRAPE C-ARM 42X72 X-RAY (DRAPES) ×3 IMPLANT
DRAPE STERI IOBAN 125X83 (DRAPES) ×3 IMPLANT
DRAPE U-SHAPE 47X51 STRL (DRAPES) ×9 IMPLANT
DRSG AQUACEL AG ADV 3.5X10 (GAUZE/BANDAGES/DRESSINGS) ×2 IMPLANT
DURAPREP 26ML APPLICATOR (WOUND CARE) ×3 IMPLANT
ELECT BLADE 4.0 EZ CLEAN MEGAD (MISCELLANEOUS) ×3
ELECT BLADE 6.5 EXT (BLADE) IMPLANT
ELECT REM PT RETURN 9FT ADLT (ELECTROSURGICAL) ×3
ELECTRODE BLDE 4.0 EZ CLN MEGD (MISCELLANEOUS) ×1 IMPLANT
ELECTRODE REM PT RTRN 9FT ADLT (ELECTROSURGICAL) ×1 IMPLANT
FACESHIELD WRAPAROUND (MASK) ×6 IMPLANT
FACESHIELD WRAPAROUND OR TEAM (MASK) ×2 IMPLANT
GAUZE XEROFORM 5X9 LF (GAUZE/BANDAGES/DRESSINGS) ×2 IMPLANT
GLOVE BIOGEL PI IND STRL 8 (GLOVE) ×2 IMPLANT
GLOVE BIOGEL PI INDICATOR 8 (GLOVE) ×4
GLOVE ECLIPSE 8.0 STRL XLNG CF (GLOVE) ×3 IMPLANT
GLOVE ORTHO TXT STRL SZ7.5 (GLOVE) ×6 IMPLANT
GOWN STRL REUS W/ TWL LRG LVL3 (GOWN DISPOSABLE) ×2 IMPLANT
GOWN STRL REUS W/ TWL XL LVL3 (GOWN DISPOSABLE) ×2 IMPLANT
GOWN STRL REUS W/TWL LRG LVL3 (GOWN DISPOSABLE) ×4
GOWN STRL REUS W/TWL XL LVL3 (GOWN DISPOSABLE) ×4
HANDPIECE INTERPULSE COAX TIP (DISPOSABLE) ×2
HEAD FEM STD 32X+1 STRL (Hips) ×2 IMPLANT
KIT BASIN OR (CUSTOM PROCEDURE TRAY) ×3 IMPLANT
KIT TURNOVER KIT B (KITS) ×3 IMPLANT
LINER ACETABULAR 32X50 (Liner) ×2 IMPLANT
MANIFOLD NEPTUNE II (INSTRUMENTS) ×3 IMPLANT
NS IRRIG 1000ML POUR BTL (IV SOLUTION) ×3 IMPLANT
PACK TOTAL JOINT (CUSTOM PROCEDURE TRAY) ×3 IMPLANT
SCREW 6.5MMX25MM (Screw) ×2 IMPLANT
SET HNDPC FAN SPRY TIP SCT (DISPOSABLE) ×1 IMPLANT
STEM CORAIL KA12 (Stem) ×2 IMPLANT
SUT ETHIBOND NAB CT1 #1 30IN (SUTURE) ×3 IMPLANT
SUT MNCRL AB 4-0 PS2 18 (SUTURE) IMPLANT
SUT VIC AB 0 CT1 27 (SUTURE) ×2
SUT VIC AB 0 CT1 27XBRD ANBCTR (SUTURE) ×1 IMPLANT
SUT VIC AB 1 CT1 27 (SUTURE) ×2
SUT VIC AB 1 CT1 27XBRD ANBCTR (SUTURE) ×1 IMPLANT
SUT VIC AB 2-0 CT1 27 (SUTURE) ×2
SUT VIC AB 2-0 CT1 TAPERPNT 27 (SUTURE) ×1 IMPLANT
TOWEL GREEN STERILE (TOWEL DISPOSABLE) ×3 IMPLANT
TOWEL GREEN STERILE FF (TOWEL DISPOSABLE) ×3 IMPLANT
TRAY CATH 16FR W/PLASTIC CATH (SET/KITS/TRAYS/PACK) IMPLANT
TRAY FOLEY W/BAG SLVR 16FR (SET/KITS/TRAYS/PACK) ×2
TRAY FOLEY W/BAG SLVR 16FR ST (SET/KITS/TRAYS/PACK) IMPLANT
WATER STERILE IRR 1000ML POUR (IV SOLUTION) ×6 IMPLANT

## 2019-07-26 NOTE — Anesthesia Procedure Notes (Signed)
Spinal  Patient location during procedure: OR Staffing Performed: anesthesiologist  Anesthesiologist: Danisa Kopec E, MD Preanesthetic Checklist Completed: patient identified, IV checked, risks and benefits discussed, surgical consent, monitors and equipment checked, pre-op evaluation and timeout performed Spinal Block Patient position: sitting Prep: DuraPrep and site prepped and draped Patient monitoring: continuous pulse ox, blood pressure and heart rate Approach: midline Location: L3-4 Injection technique: single-shot Needle Needle type: Pencan  Needle gauge: 24 G Needle length: 9 cm Additional Notes Functioning IV was confirmed and monitors were applied. Sterile prep and drape, including hand hygiene and sterile gloves were used. The patient was positioned and the spine was prepped. The skin was anesthetized with lidocaine.  Free flow of clear CSF was obtained prior to injecting local anesthetic into the CSF. The needle was carefully withdrawn. The patient tolerated the procedure well.      

## 2019-07-26 NOTE — Transfer of Care (Signed)
Immediate Anesthesia Transfer of Care Note  Patient: Sarah Manning  Procedure(s) Performed: LEFT TOTAL HIP ARTHROPLASTY ANTERIOR APPROACH (Left Hip)  Patient Location: PACU  Anesthesia Type:MAC and Spinal  Level of Consciousness: awake, alert , oriented and sedated  Airway & Oxygen Therapy: Patient Spontanous Breathing and Patient connected to face mask oxygen  Post-op Assessment: Report given to RN, Post -op Vital signs reviewed and stable and Patient moving all extremities  Post vital signs: Reviewed and stable  Last Vitals:  Vitals Value Taken Time  BP 116/68 07/26/19 1345  Temp    Pulse 67 07/26/19 1346  Resp 13 07/26/19 1346  SpO2 100 % 07/26/19 1346  Vitals shown include unvalidated device data.  Last Pain:  Vitals:   07/26/19 1036  TempSrc:   PainSc: 7       Patients Stated Pain Goal: 4 (03/49/17 9150)  Complications: No apparent anesthesia complications

## 2019-07-26 NOTE — Anesthesia Procedure Notes (Signed)
Procedure Name: MAC Date/Time: 07/26/2019 12:06 PM Performed by: Scheryl Darter, CRNA Pre-anesthesia Checklist: Patient identified, Emergency Drugs available, Suction available, Patient being monitored and Timeout performed Patient Re-evaluated:Patient Re-evaluated prior to induction Oxygen Delivery Method: Simple face mask Placement Confirmation: positive ETCO2

## 2019-07-26 NOTE — Evaluation (Signed)
Physical Therapy Evaluation Patient Details Name: Sarah Manning MRN: [id number] DOB: December 22, 1924 Today's Date: 07/26/2019   History of Present Illness  Pt is a 86 y/o female s/p L THA, direct anterior. PMH includes COPD, HTN, osteoporosis.   Clinical Impression  Pt s/p surgery above with deficits below. Pt requiring min to min guard A for mobility to chair using RW. Pt limited secondary to +orthostatics. Pt's BP at 72/41 mmHg following transfer. Elevated to 98/46 mmHg after placing in reclined positioned. Notified RN and RN to address. Feel pt will progress well once symptoms improve. Will continue to follow acutely to maximize functional mobility independence and safety.     Follow Up Recommendations Follow surgeon's recommendation for DC plan and follow-up therapies;Supervision for mobility/OOB    Equipment Recommendations  None recommended by PT    Recommendations for Other Services       Precautions / Restrictions Precautions Precautions: Fall;Other (comment) Precaution Comments: Watch BP Restrictions Weight Bearing Restrictions: Yes LLE Weight Bearing: Weight bearing as tolerated      Mobility  Bed Mobility Overal bed mobility: Needs Assistance Bed Mobility: Supine to Sit     Supine to sit: Min assist     General bed mobility comments: Min A for LLE assist. Increased time required to come to EOB.   Transfers Overall transfer level: Needs assistance Equipment used: Rolling walker (2 wheeled) Transfers: Sit to/from Stand Sit to Stand: Min assist         General transfer comment: Min A for lift assist and steadying. Cues for safe hand placement.   Ambulation/Gait Ambulation/Gait assistance: Min guard Gait Distance (Feet): 2 Feet Assistive device: Rolling walker (2 wheeled) Gait Pattern/deviations: Step-to pattern;Decreased step length - right;Decreased step length - left;Antalgic;Decreased weight shift to left Gait velocity: Decreased   General Gait  Details: Slow, antalgic gait. Cues for sequencing using RW. Pt became symptomatic and reports room is fuzzy, so further mobility deferred. Checked BP and pt with + orthostatics. See vitals flowsheet.   Stairs            Wheelchair Mobility    Modified Rankin (Stroke Patients Only)       Balance Overall balance assessment: Needs assistance Sitting-balance support: No upper extremity supported;Feet supported Sitting balance-Leahy Scale: Good     Standing balance support: Bilateral upper extremity supported;During functional activity Standing balance-Leahy Scale: Poor Standing balance comment: Reliant on BUE support                             Pertinent Vitals/Pain Pain Assessment: Faces Faces Pain Scale: Hurts even more Pain Location: L hip  Pain Descriptors / Indicators: Aching;Operative site guarding Pain Intervention(s): Limited activity within patient's tolerance;Monitored during session;Repositioned    Home Living Family/patient expects to be discharged to:: Private residence Living Arrangements: Spouse/significant other Available Help at Discharge: Family;Available 24 hours/day Type of Home: House Home Access: Stairs to enter Entrance Stairs-Rails: Left Entrance Stairs-Number of Steps: 3 Home Layout: One level Home Equipment: Walker - 2 wheels;Toilet riser;Grab bars - tub/shower      Prior Function Level of Independence: Independent with assistive device(s)         Comments: Used RW for mobility      Hand Dominance        Extremity/Trunk Assessment   Upper Extremity Assessment Upper Extremity Assessment: Overall WFL for tasks assessed    Lower Extremity Assessment Lower Extremity Assessment: LLE deficits/detail LLE Deficits / Details: Deficits  consistent with post op pain and weakness.     Cervical / Trunk Assessment Cervical / Trunk Assessment: Normal  Communication   Communication: No difficulties  Cognition  Arousal/Alertness: Awake/alert Behavior During Therapy: WFL for tasks assessed/performed Overall Cognitive Status: Within Functional Limits for tasks assessed                                        General Comments General comments (skin integrity, edema, etc.): Pt's daughter present during session.     Exercises Total Joint Exercises Ankle Circles/Pumps: AROM;Both;10 reps;Seated   Assessment/Plan    PT Assessment Patient needs continued PT services  PT Problem List Decreased strength;Decreased range of motion;Decreased balance;Decreased activity tolerance;Decreased mobility;Cardiopulmonary status limiting activity;Decreased knowledge of use of DME       PT Treatment Interventions Gait training;Stair training;DME instruction;Functional mobility training;Therapeutic activities;Balance training;Therapeutic exercise;Patient/family education    PT Goals (Current goals can be found in the Care Plan section)  Acute Rehab PT Goals Patient Stated Goal: to go home PT Goal Formulation: With patient Time For Goal Achievement: 08/09/19 Potential to Achieve Goals: Good    Frequency 7X/week   Barriers to discharge        Co-evaluation               AM-PAC PT "6 Clicks" Mobility  Outcome Measure Help needed turning from your back to your side while in a flat bed without using bedrails?: A Little Help needed moving from lying on your back to sitting on the side of a flat bed without using bedrails?: A Little Help needed moving to and from a bed to a chair (including a wheelchair)?: A Little Help needed standing up from a chair using your arms (e.g., wheelchair or bedside chair)?: A Little Help needed to walk in hospital room?: A Lot Help needed climbing 3-5 steps with a railing? : A Lot 6 Click Score: 16    End of Session Equipment Utilized During Treatment: Gait belt Activity Tolerance: Treatment limited secondary to medical complications  (Comment)(+orthostatics) Patient left: in bed;with call bell/phone within reach;with family/visitor present Nurse Communication: Mobility status;Other (comment)(+ orthostatics) PT Visit Diagnosis: Other abnormalities of gait and mobility (R26.89);Muscle weakness (generalized) (M62.81);Difficulty in walking, not elsewhere classified (R26.2);Pain Pain - Right/Left: Left Pain - part of body: Hip    Time: 1636-1710 PT Time Calculation (min) (ACUTE ONLY): 34 min   Charges:   PT Evaluation $PT Eval Moderate Complexity: 1 Mod PT Treatments $Therapeutic Activity: 8-22 mins        Lou Miner, DPT  Acute Rehabilitation Services  Pager: [phone] Office: [phone]   Rudean Hitt 07/26/2019, 5:36 PM

## 2019-07-26 NOTE — Progress Notes (Signed)
Notified per PT of orthostatic BP changes during eval. On call MD paged. Patient sitting up in chair, no acute distress noted. States she did feel dizzy when standing. Denies pain. Instructed to call PRN. Call bell within reach. Will continue to monitor.

## 2019-07-26 NOTE — H&P (Signed)
TOTAL HIP ADMISSION H&P  Patient is admitted for left total hip arthroplasty.  Subjective:  Chief Complaint: left hip pain  HPI: Sarah Manning, 86 y.o. female, has a history of pain and functional disability in the left hip(s) due to arthritis and patient has failed non-surgical conservative treatments for greater than 12 weeks to include NSAID's and/or analgesics, corticosteriod injections, supervised PT with diminished ADL's post treatment, use of assistive devices and activity modification.  Onset of symptoms was gradual starting 3 years ago with rapidlly worsening course since that time.The patient noted no past surgery on the left hip(s).  Patient currently rates pain in the left hip at 10 out of 10 with activity. Patient has night pain, worsening of pain with activity and weight bearing, trendelenberg gait, pain that interfers with activities of daily living and pain with passive range of motion. Patient has evidence of subchondral cysts, subchondral sclerosis, periarticular osteophytes and joint space narrowing by imaging studies. This condition presents safety issues increasing the risk of falls.  There is no current active infection.  Patient Active Problem List   Diagnosis Date Noted  . Unilateral primary osteoarthritis, left hip 07/05/2018  . Primary osteoarthritis of left hip 10/24/2016  . Chronic cholecystitis with calculus 01/11/2015  . Symptomatic cholelithiasis 01/10/2015  . Diverticulosis of colon without hemorrhage 12/26/2014  . Hyperlipidemia 12/26/2014  . Hypothyroidism 12/26/2014   Past Medical History:  Diagnosis Date  . Arthritis   . Autoimmune disease ([place])    positive ANA, double stranded DNA, anti-RO and anti- LA  . Cholelithiasis   . Colonic adenoma   . COPD (chronic obstructive pulmonary disease) ([place])    non smoker  . Depression   . Diverticulosis   . Heart murmur   . Hypertension   . Hypothyroidism   . Osteoporosis     Past Surgical History:   Procedure Laterality Date  . CHOLECYSTECTOMY N/A 01/11/2015   Procedure: LAPAROSCOPIC CHOLECYSTECTOMY WITH INTRAOPERATIVE CHOLANGIOGRAM;  Surgeon: Armandina Gemma, MD;  Location: [facility];  Service: General;  Laterality: N/A;  . WRIST FRACTURE SURGERY      Current Facility-Administered Medications  Medication Dose Route Frequency Provider Last Rate Last Admin  . chlorhexidine (HIBICLENS) 4 % liquid 4 application  60 mL Topical Once Erskine Emery W, PA-C      . clindamycin (CLEOCIN) 900 MG/50ML IVPB           . clindamycin (CLEOCIN) IVPB 900 mg  900 mg Intravenous On Call to OR Pete Pelt, PA-C      . lactated ringers infusion   Intravenous Continuous Lidia Collum, MD 10 mL/hr at 07/26/19 1035 New Bag at 07/26/19 1035  . povidone-iodine 10 % swab 2 application  2 application Topical Once Erskine Emery W, PA-C      . tranexamic acid (CYKLOKAPRON) 1000MG /16mL IVPB           . tranexamic acid (CYKLOKAPRON) IVPB 1,000 mg  1,000 mg Intravenous To OR Pete Pelt, PA-C       Allergies  Allergen Reactions  . Amoxicillin Itching, Rash and Other (See Comments)    Did it involve swelling of the face/tongue/throat, SOB, or low BP? No Did it involve sudden or severe rash/hives, skin peeling, or any reaction on the inside of your mouth or nose? Yes Did you need to seek medical attention at a hospital or doctor's office? Yes When did it last happen?86 yrs old If all above answers are "NO", may proceed with cephalosporin use.   Marland Kitchen  Aspirin Other (See Comments)    Excessive blood thinning  . Penicillins Itching, Rash and Other (See Comments)    Did it involve swelling of the face/tongue/throat, SOB, or low BP? No Did it involve sudden or severe rash/hives, skin peeling, or any reaction on the inside of your mouth or nose? Yes Did you need to seek medical attention at a hospital or doctor's office? Yes When did it last happen?86 yrs old If all above answers are "NO", may proceed  with cephalosporin use.    Social History   Tobacco Use  . Smoking status: Never Smoker  . Smokeless tobacco: Never Used  Substance Use Topics  . Alcohol use: Yes    Alcohol/week: 0.0 standard drinks    Comment: Occ. alcohol    Family History  Problem Relation Age of Onset  . Heart disease Father   . Stomach cancer Brother   . Prostate cancer Brother   . Alzheimer's disease Mother   . Colon cancer Neg Hx      Review of Systems  Objective:  Physical Exam  Constitutional: She is oriented to person, place, and time. She appears well-developed and well-nourished.  HENT:  Head: Normocephalic and atraumatic.  Cardiovascular: Normal rate.  Respiratory: Effort normal.  GI: Soft.  Musculoskeletal:     Cervical back: Normal range of motion.     Right hip: Tenderness and bony tenderness present. Decreased range of motion. Decreased strength.  Neurological: She is alert and oriented to person, place, and time.  Skin: Skin is warm and dry.  Psychiatric: She has a normal mood and affect.    Vital signs in last 24 hours: Temp:  [97.9 F (36.6 C)] 97.9 F (36.6 C) (02/23 1004) Pulse Rate:  [69] 69 (02/23 1004) Resp:  [18] 18 (02/23 1004) BP: (187)/(57) 187/57 (02/23 1004) SpO2:  [98 %] 98 % (02/23 1004) Weight:  [58.8 kg] 58.8 kg (02/23 1004)  Labs:   Estimated body mass index is 21.58 kg/m as calculated from the following:   Height as of this encounter: 5\' 5"  (1.651 m).   Weight as of this encounter: 58.8 kg.   Imaging Review Plain radiographs demonstrate severe degenerative joint disease of the left hip(s). The bone quality appears to be fair for age and reported activity level.      Assessment/Plan:  End stage arthritis, left hip(s)  The patient history, physical examination, clinical judgement of the provider and imaging studies are consistent with end stage degenerative joint disease of the left hip(s) and total hip arthroplasty is deemed medically  necessary. The treatment options including medical management, injection therapy, arthroscopy and arthroplasty were discussed at length. The risks and benefits of total hip arthroplasty were presented and reviewed. The risks due to aseptic loosening, infection, stiffness, dislocation/subluxation,  thromboembolic complications and other imponderables were discussed.  The patient acknowledged the explanation, agreed to proceed with the plan and consent was signed. Patient is being admitted for inpatient treatment for surgery, pain control, PT, OT, prophylactic antibiotics, VTE prophylaxis, progressive ambulation and ADL's and discharge planning.The patient is planning to be discharged home with home health services vs short-term skilled nursing depending on her post-op progress.

## 2019-07-26 NOTE — Op Note (Signed)
NAME: Sarah Manning, Sarah Manning MEDICAL RECORD [id number] ACCOUNT [id number] DATE OF BIRTH:Mar 16, 1925 FACILITY: [facility] LOCATION: [facility] PHYSICIAN:Almee Pelphrey [place], MD  OPERATIVE REPORT  DATE OF PROCEDURE:  07/26/2019  PREOPERATIVE DIAGNOSIS:  Severe end-stage arthritis and degenerative joint disease, left hip.  POSTOPERATIVE DIAGNOSIS:  Severe end-stage arthritis and degenerative joint disease, left hip.  PROCEDURE:  Left total hip arthroplasty through direct anterior approach.  IMPLANTS:  DePuy Sector Gription acetabular component size 50 with a single screw, size 36+0 neutral polyethylene liner, size 12 Corail femoral component with standard offset, size 32+1 metal hip ball.  SURGEON:  Lind Guest. Ninfa Linden, MD  ASSISTANT:  Erskine Emery, PA-C  ANESTHESIA:  Spinal.  ANTIBIOTICS:  900 mg IV clindamycin.  ESTIMATED BLOOD LOSS:  200 mL.  COMPLICATIONS:  None.  INDICATIONS:  The patient is a 86 year old female well known to [place].  She has been seen in our office by my partner and me for several years now.  She has had worsening severe arthritis of her left hip.  We did not deem her a surgical candidate at one  point, but her hip pain has become debilitating.  She has had a recent fall and it has gotten worse.  She has tried intraarticular injections.  We talked to her daughter at length as well as her.  This is a quality of life issue now.  Her pain is 10/10  and is daily and her quality of life is miserable.  This is detrimentally affecting her mobility and activities of daily living to the point, she does wish to proceed with total hip arthroplasty.  We talked about the risk of acute blood loss anemia,  nerve or vessel injury, fracture, infection, dislocation, DVT and implant failure.  We talked about our goals being decreased pain, improved mobility and overall improved quality of life.  DESCRIPTION OF PROCEDURE:  After informed consent was obtained and appropriate left hip was  marked, she was brought to the operating room and sat up on a stretcher where spinal anesthesia was obtained.  She was then laid in the supine position on a  stretcher.  A Foley catheter was placed.  I was able to assess her leg lengths and found them to be equal.  Traction boots were then placed on both her feet.  Next, she was placed supine on the Hana fracture table with the perineal post in place and both  legs in line skeletal traction device and no traction applied.  Her left operative hip was prepped and draped with DuraPrep and sterile drapes.  A time-out was called and she was identified, correct patient, correct left hip.  I then made an incision  just inferior and posterior to the anterior superior iliac spine and carried this obliquely down the leg.  We dissected down to the tensor fascia lata muscle.  Tensor fascia was then divided longitudinally to proceed with direct anterior approach to the  hip.  We identified and cauterized circumflex vessels and identified the hip capsule, opened up the hip capsule in an L-type format, finding moderate joint effusion and significant periarticular osteophytes around the femoral head and neck.  We then made  our femoral neck cut with an oscillating saw just proximal to the lesser trochanter and completed this with an osteotome.  We placed a corkscrew guide in the femoral head and removed the femoral head in its entirety and found a wide area completely  devoid of cartilage and significant sclerotic changes in the acetabulum as well.  We  placed a bent Hohmann over the medial acetabular rim and then began reaming under direct visualization from a size 44 reamer in stepwise increments up to a size 49.   With all reamers placed under direct visualization, the last reamer was placed under direct fluoroscopy as well, so we could obtain our depth of reaming, our inclination and anteversion.  I then placed the real DePuy Sector Gription acetabular component  size 50  and I did place a single screw due to the sclerotic nature of her acetabular bone.  Attention was then turned to the femur.  With the leg externally rotated to 120 degrees, extended and adducted, we were able to place a Mueller retractor medially  and a Hohman retractor behind the greater trochanter, released the lateral joint capsule and used a box-cutting osteotome to enter the femoral canal and a rongeur to lateralize it.  We then began broaching using the Corail broaching system from a size 8  up to a size 12.  With the size 12 in place, we felt we had a good fit, so we trialed a 32+1 trial hip ball and brought the leg back over and up.  With traction and internal rotation, reducing the pelvis, we were pleased with the leg length, offset,  range of motion and stability assessed radiographically and mechanically.  We adducted.  We then dislocated the hip and removed the trial components.  We placed the real Corail femoral component size 12 with standard offset and the real 32+1 metal hip  ball and again reduced this in the acetabulum and we were pleased with stability.  We then irrigated the soft tissue with normal saline solution using pulse lavage.  We assessed again radiographically and mechanically.  We then closed the joint capsule  with interrupted #1 Ethibond suture.  Number 1 Vicryl was used to close the tensor fascia, 0 Vicryl was used to close the deep tissue, 2-0 Vicryl was used to close the subcutaneous tissue and staples were used to close the skin.  Xeroform and Aquacel  dressing was applied.  She was taken off of the Hana table and taken to recovery room in stable condition.  All final counts were correct.  There were no complications noted.  Of note, Benita Stabile, PA-C, assisted during the entire case and his assistance  was crucial for facilitating all aspects of this case.  VN/NUANCE  D:07/26/2019 T:07/26/2019 JOB:[id number]

## 2019-07-26 NOTE — Anesthesia Postprocedure Evaluation (Signed)
Anesthesia Post Note  Patient: Sarah Manning  Procedure(s) Performed: LEFT TOTAL HIP ARTHROPLASTY ANTERIOR APPROACH (Left Hip)     Patient location during evaluation: PACU Anesthesia Type: MAC Level of consciousness: oriented and awake and alert Pain management: pain level controlled Vital Signs Assessment: post-procedure vital signs reviewed and stable Respiratory status: spontaneous breathing, respiratory function stable and nonlabored ventilation Cardiovascular status: blood pressure returned to baseline and stable Postop Assessment: no headache, no backache, no apparent nausea or vomiting and spinal receding Anesthetic complications: no    Last Vitals:  Vitals:   07/26/19 1500 07/26/19 1518  BP: (!) 157/65 (!) 158/56  Pulse: 60 62  Resp: 14 16  Temp:  (!) 36.3 C  SpO2: 97% 97%    Last Pain:  Vitals:   07/26/19 1518  TempSrc: Oral  PainSc:                  Lidia Collum

## 2019-07-26 NOTE — Anesthesia Preprocedure Evaluation (Addendum)
Anesthesia Evaluation  Patient identified by MRN, date of birth, ID band Patient awake    Reviewed: Allergy & Precautions, NPO status , Patient's Chart, lab work & pertinent test results  History of Anesthesia Complications Negative for: history of anesthetic complications  Airway Mallampati: II  TM Distance: >3 FB Neck ROM: Full    Dental   Pulmonary COPD,    Pulmonary exam normal        Cardiovascular hypertension, Normal cardiovascular exam     Neuro/Psych PSYCHIATRIC DISORDERS Depression negative neurological ROS     GI/Hepatic Neg liver ROS, GERD  ,  Endo/Other  Hypothyroidism   Renal/GU negative Renal ROS  negative genitourinary   Musculoskeletal  (+) Arthritis ,   Abdominal   Peds  Hematology  (+) anemia ,   Anesthesia Other Findings Echo 2012: EF 55%, gr 1 dd, mild AI, mild MR  Reproductive/Obstetrics                            Anesthesia Physical Anesthesia Plan  ASA: III  Anesthesia Plan: Spinal   Post-op Pain Management:    Induction:   PONV Risk Score and Plan: 2 and Propofol infusion, Treatment may vary due to age or medical condition, Ondansetron and TIVA  Airway Management Planned: Nasal Cannula and Simple Face Mask  Additional Equipment: None  Intra-op Plan:   Post-operative Plan:   Informed Consent: I have reviewed the patients History and Physical, chart, labs and discussed the procedure including the risks, benefits and alternatives for the proposed anesthesia with the patient or authorized representative who has indicated his/her understanding and acceptance.       Plan Discussed with:   Anesthesia Plan Comments:        Anesthesia Quick Evaluation

## 2019-07-26 NOTE — Brief Op Note (Signed)
07/26/2019  1:27 PM  PATIENT:  Sarah Manning  86 y.o. female  PRE-OPERATIVE DIAGNOSIS:  endstage osteoarthritis left hip  POST-OPERATIVE DIAGNOSIS:  endstage osteoarthritis left hip  PROCEDURE:  Procedure(s): LEFT TOTAL HIP ARTHROPLASTY ANTERIOR APPROACH (Left)  SURGEON:  Surgeon(s) and Role:    * Mcarthur Rossetti, MD - Primary  PHYSICIAN ASSISTANT: Benita Stabile, PA-C  ANESTHESIA:   spinal  EBL:  200 cc  COUNTS:  YES  DICTATION: .Other Dictation: Dictation Number [id number]  PLAN OF CARE: Admit to inpatient   PATIENT DISPOSITION:  PACU - hemodynamically stable.   Delay start of Pharmacological VTE agent (>24hrs) due to surgical blood loss or risk of bleeding: no

## 2019-07-26 NOTE — Care Plan (Signed)
Ortho Bundle Case Management Note  Patient Details  Name: Sarah Manning MRN: [id number] Date of Birth: 1925/01/31  Patient is an Ortho bundle patient who will be having surgery on Tuesday, 07/26/19 with Dr. Ninfa Linden for a Left THA. Reviewed Ortho bundle program through [facility] and patient is agreeable. Reviewed all pre- and post-op instructions and provided patient ability to ask questions. Patient has a spouse as well as son and daughter that will be assisting her at home after surgery. Anticipate HHPT will be needed after surgery. Choice provided and referral made to [facility]. Patient has a FWW and elevated toilet seats. No DME needed. Will continue to follow for all CM needs.                   DME Arranged:  (No DME needed; patient verbalized she has a FWW and elevated toilet seats in home) DME Agency:     [place]:  PT [place]:  [facility] (now [facility])  Additional Comments: Please contact me with any questions of if this plan should need to change.  Jamse Arn, RN, BSN, [employer]  [phone] 07/26/2019, 2:40 PM

## 2019-07-27 ENCOUNTER — Encounter: Payer: Self-pay | Admitting: *Deleted

## 2019-07-27 DIAGNOSIS — J449 Chronic obstructive pulmonary disease, unspecified: Secondary | ICD-10-CM | POA: Diagnosis not present

## 2019-07-27 DIAGNOSIS — E039 Hypothyroidism, unspecified: Secondary | ICD-10-CM | POA: Diagnosis not present

## 2019-07-27 DIAGNOSIS — E785 Hyperlipidemia, unspecified: Secondary | ICD-10-CM | POA: Diagnosis not present

## 2019-07-27 DIAGNOSIS — M359 Systemic involvement of connective tissue, unspecified: Secondary | ICD-10-CM | POA: Diagnosis not present

## 2019-07-27 DIAGNOSIS — F329 Major depressive disorder, single episode, unspecified: Secondary | ICD-10-CM | POA: Diagnosis not present

## 2019-07-27 DIAGNOSIS — R011 Cardiac murmur, unspecified: Secondary | ICD-10-CM | POA: Diagnosis not present

## 2019-07-27 DIAGNOSIS — K5790 Diverticulosis of intestine, part unspecified, without perforation or abscess without bleeding: Secondary | ICD-10-CM | POA: Diagnosis not present

## 2019-07-27 DIAGNOSIS — M1612 Unilateral primary osteoarthritis, left hip: Secondary | ICD-10-CM | POA: Diagnosis not present

## 2019-07-27 DIAGNOSIS — Z86010 Personal history of colonic polyps: Secondary | ICD-10-CM | POA: Diagnosis not present

## 2019-07-27 LAB — BASIC METABOLIC PANEL
Anion gap: 8 (ref 5–15)
BUN: 14 mg/dL (ref 8–23)
CO2: 26 mmol/L (ref 22–32)
Calcium: 7.9 mg/dL — ABNORMAL LOW (ref 8.9–10.3)
Chloride: 106 mmol/L (ref 98–111)
Creatinine, Ser: 0.89 mg/dL (ref 0.44–1.00)
GFR calc Af Amer: >60 mL/min (ref >60)
GFR calc non Af Amer: 56 mL/min — ABNORMAL LOW (ref >60)
Glucose, Bld: 115 mg/dL — ABNORMAL HIGH (ref 70–99)
Potassium: 4.2 mmol/L (ref 3.5–5.1)
Sodium: 140 mmol/L (ref 135–145)

## 2019-07-27 LAB — CBC
HCT: 29.5 % — ABNORMAL LOW (ref 36.0–46.0)
Hemoglobin: 9.5 g/dL — ABNORMAL LOW (ref 12.0–15.0)
MCH: 31.4 pg (ref 26.0–34.0)
MCHC: 32.2 g/dL (ref 30.0–36.0)
MCV: 97.4 fL (ref 80.0–100.0)
Platelets: 246 10*3/uL (ref 150–400)
RBC: 3.03 MIL/uL — ABNORMAL LOW (ref 3.87–5.11)
RDW: 12.6 % (ref 11.5–15.5)
WBC: 8.1 10*3/uL (ref 4.0–10.5)
nRBC: 0 % (ref 0.0–0.2)

## 2019-07-27 MED ORDER — METHOCARBAMOL 500 MG PO TABS: 500.0000 mg | ORAL_TABLET | Freq: Four times a day (QID) | ORAL | 0 refills | Status: DC | PRN

## 2019-07-27 MED ORDER — TRAMADOL HCL 50 MG PO TABS: 50.0000 mg | ORAL_TABLET | Freq: Four times a day (QID) | ORAL | 0 refills | Status: DC

## 2019-07-27 MED ORDER — ASPIRIN 81 MG PO CHEW: 81.0000 mg | CHEWABLE_TABLET | Freq: Two times a day (BID) | ORAL | 0 refills | Status: DC

## 2019-07-27 NOTE — Progress Notes (Signed)
Subjective: 1 Day Post-Op Procedure(s) (LRB): LEFT TOTAL HIP ARTHROPLASTY ANTERIOR APPROACH (Left) Patient reports pain as mild.  Back discomfort. Otherwise no complaints. Wanting to discharge to home later today. IV infiltrated left arm.   Objective: Vital signs in last 24 hours: Temp:  [97.4 F (36.3 C)-98.4 F (36.9 C)] 98.2 F (36.8 C) (02/24 0435) Pulse Rate:  [55-77] 77 (02/24 0435) Resp:  [12-18] 18 (02/24 0435) BP: (72-187)/(41-70) 92/51 (02/24 0435) SpO2:  [95 %-99 %] 95 % (02/24 0435) Weight:  [58.8 kg] 58.8 kg (02/23 1004)  Intake/Output from previous day: 02/23 0701 - 02/24 0700 In: 1788.7 [P.O.:520; I.V.:1268.7] Out: 1000 [Urine:800; Blood:200] Intake/Output this shift: No intake/output data recorded.  Recent Labs    07/27/19 0307  HGB 9.5*   Recent Labs    07/27/19 0307  WBC 8.1  RBC 3.03*  HCT 29.5*  PLT 246   Recent Labs    07/27/19 0307  NA 140  K 4.2  CL 106  CO2 26  BUN 14  CREATININE 0.89  GLUCOSE 115*  CALCIUM 7.9*   No results for input(s): LABPT, INR in the last 72 hours.  Left lower extremity: Intact pulses distally Dorsiflexion/Plantar flexion intact Incision: dressing C/D/I Compartment soft   Assessment/Plan: 1 Day Post-Op Procedure(s) (LRB): LEFT TOTAL HIP ARTHROPLASTY ANTERIOR APPROACH (Left) Up with therapy  Encouraged elevation of left arm and wiggling of fingers.  Possible discharge to home later today if patient does well with PT and remains stable.      Quetzal Meany 07/27/2019, 8:27 AM

## 2019-07-27 NOTE — Plan of Care (Signed)
  Problem: Education: Goal: Knowledge of General Education information will improve Description: Including pain rating scale, medication(s)/side effects and non-pharmacologic comfort measures Outcome: Progressing   Problem: Clinical Measurements: Goal: Will remain free from infection Outcome: Progressing   Problem: Activity: Goal: Risk for activity intolerance will decrease Outcome: Progressing   

## 2019-07-27 NOTE — Progress Notes (Signed)
Patient ID: Sarah Manning, female   DOB: [birth year], 86 y.o.   MRN: [id number] She seems to be doing very well overall.  Family is at the bedside.  She can be discharged to home this afternoon.  I spoke to her about holding her blood pressure medications for the next 5-6 days and to check her BP at home.

## 2019-07-27 NOTE — Progress Notes (Signed)
Discontinued IV in left arm due to infiltration. Hand and left arm swollen. Patient denies having any pain to affected site.,just some mild itching and redness.

## 2019-07-27 NOTE — Discharge Instructions (Signed)

## 2019-07-27 NOTE — Discharge Summary (Signed)
Patient ID: Sarah Manning MRN: [id number] DOB/AGE: 1924/10/27 86 y.o.  Admit date: 07/26/2019 Discharge date: 07/27/2019  Admission Diagnoses:  Principal Problem:   Unilateral primary osteoarthritis, left hip Active Problems:   Status post total replacement of left hip   Discharge Diagnoses:  Same  Past Medical History:  Diagnosis Date  . Arthritis   . Autoimmune disease ([place])    positive ANA, double stranded DNA, anti-RO and anti- LA  . Cholelithiasis   . Colonic adenoma   . COPD (chronic obstructive pulmonary disease) ([place])    non smoker  . Depression   . Diverticulosis   . Heart murmur   . Hypertension   . Hypothyroidism   . Osteoporosis     Surgeries: Procedure(s): LEFT TOTAL HIP ARTHROPLASTY ANTERIOR APPROACH on 07/26/2019   Consultants:   Discharged Condition: Improved  Hospital Course: Sarah Manning is an 86 y.o. female who was admitted 07/26/2019 for operative treatment ofUnilateral primary osteoarthritis, left hip. Patient has severe unremitting pain that affects sleep, daily activities, and work/hobbies. After pre-op clearance the patient was taken to the operating room on 07/26/2019 and underwent  Procedure(s): LEFT TOTAL HIP ARTHROPLASTY ANTERIOR APPROACH.    Patient was given perioperative antibiotics:  Anti-infectives (From admission, onward)   Start     Dose/Rate Route Frequency Ordered Stop   07/26/19 1800  clindamycin (CLEOCIN) IVPB 600 mg     600 mg 100 mL/hr over 30 Minutes Intravenous Every 6 hours 07/26/19 1517 07/27/19 0108   07/26/19 1006  clindamycin (CLEOCIN) 900 MG/50ML IVPB    Note to Pharmacy: Nyoka Cowden   : cabinet override      07/26/19 1006 07/26/19 1243   07/26/19 1000  clindamycin (CLEOCIN) IVPB 900 mg     900 mg 100 mL/hr over 30 Minutes Intravenous On call to O.R. 07/26/19 [id number] 07/26/19 1222       Patient was given sequential compression devices, early ambulation, and chemoprophylaxis to prevent DVT.  Patient  benefited maximally from hospital stay and there were no complications.    Recent vital signs:  Patient Vitals for the past 24 hrs:  BP Temp Temp src Pulse Resp SpO2 Height Weight  07/27/19 0435 (!) 92/51 98.2 F (36.8 C) Oral 77 18 95 % -- --  07/27/19 0128 (!) 100/56 98.4 F (36.9 C) Oral 72 16 97 % -- --  07/26/19 2137 125/70 98.4 F (36.9 C) Oral 70 16 96 % -- --  07/26/19 1922 (!) 120/51 98.2 F (36.8 C) Oral 62 18 99 % -- --  07/26/19 1817 118/63 98.1 F (36.7 C) Oral 62 16 96 % -- --  07/26/19 1740 (!) 107/47 98.2 F (36.8 C) Oral 65 16 95 % -- --  07/26/19 1720 (!) 98/46 -- -- -- -- -- -- --  07/26/19 1718 (!) 91/46 -- -- -- -- -- -- --  07/26/19 1716 (!) 89/42 -- -- -- -- -- -- --  07/26/19 1715 (!) 72/41 -- -- -- -- -- -- --  07/26/19 1649 (!) 104/46 97.8 F (36.6 C) Oral 71 18 96 % -- --  07/26/19 1518 (!) 158/56 (!) 97.4 F (36.3 C) Oral 62 16 97 % -- --  07/26/19 1500 (!) 157/65 -- -- 60 14 97 % -- --  07/26/19 1445 (!) 146/62 -- -- 61 14 96 % -- --  07/26/19 1430 133/60 -- -- 62 15 97 % -- --  07/26/19 1415 (!) 143/65 -- -- 63 14 97 % -- --  07/26/19 1400 133/66 -- -- 62 12 97 % -- --  07/26/19 1345 116/68 97.8 F (36.6 C) -- (!) 55 14 99 % -- --  07/26/19 1004 (!) 187/57 97.9 F (36.6 C) Oral 69 18 98 % 5\' 5"  (1.651 m) 58.8 kg     Recent laboratory studies:  Recent Labs    07/27/19 0307  WBC 8.1  HGB 9.5*  HCT 29.5*  PLT 246  NA 140  K 4.2  CL 106  CO2 26  BUN 14  CREATININE 0.89  GLUCOSE 115*  CALCIUM 7.9*     Discharge Medications:   Allergies as of 07/27/2019      Reactions   Amoxicillin Itching, Rash, Other (See Comments)   Did it involve swelling of the face/tongue/throat, SOB, or low BP? No Did it involve sudden or severe rash/hives, skin peeling, or any reaction on the inside of your mouth or nose? Yes Did you need to seek medical attention at a hospital or doctor's office? Yes When did it last happen?86 yrs old If all above  answers are "NO", may proceed with cephalosporin use.   Aspirin Other (See Comments)   Excessive blood thinning   Penicillins Itching, Rash, Other (See Comments)   Did it involve swelling of the face/tongue/throat, SOB, or low BP? No Did it involve sudden or severe rash/hives, skin peeling, or any reaction on the inside of your mouth or nose? Yes Did you need to seek medical attention at a hospital or doctor's office? Yes When did it last happen?86 yrs old If all above answers are "NO", may proceed with cephalosporin use.      Medication List    TAKE these medications   acetaminophen 325 MG tablet Commonly known as: TYLENOL Take 2 tablets (650 mg total) by mouth every 6 (six) hours as needed for mild pain (or temp > 100). What changed:   how much to take  Another medication with the same name was removed. Continue taking this medication, and follow the directions you see here.   aspirin 81 MG chewable tablet Chew 1 tablet (81 mg total) by mouth 2 (two) times daily.   atorvastatin 10 MG tablet Commonly known as: LIPITOR Take 10 mg by mouth at bedtime.   clonazePAM 0.5 MG tablet Commonly known as: KLONOPIN Take 0.5 mg by mouth 2 (two) times daily as needed for anxiety.   diclofenac Sodium 1 % Gel Commonly known as: VOLTAREN Apply 2 g topically 4 (four) times daily as needed (leg pain.).   escitalopram 20 MG tablet Commonly known as: LEXAPRO Take 20 mg by mouth daily.   gabapentin 100 MG capsule Commonly known as: NEURONTIN Take 200 mg by mouth 2 (two) times daily.   hydroxypropyl methylcellulose / hypromellose 2.5 % ophthalmic solution Commonly known as: ISOPTO TEARS / GONIOVISC Place 1 drop into both eyes 3 (three) times daily as needed for dry eyes.   levothyroxine 125 MCG tablet Commonly known as: SYNTHROID Take 125 mcg by mouth daily before breakfast.   lisinopril 20 MG tablet Commonly known as: ZESTRIL Take 20 mg by mouth daily.   methocarbamol 500  MG tablet Commonly known as: ROBAXIN Take 1 tablet (500 mg total) by mouth every 6 (six) hours as needed for muscle spasms.   multivitamin with minerals Tabs tablet Take 2 tablets by mouth daily.   pantoprazole 40 MG tablet Commonly known as: PROTONIX One tablet orally qam 30 mins before meals What changed:   how much to take  how to take this  when to take this  additional instructions   polyethylene glycol powder 17 GM/SCOOP powder Commonly known as: GLYCOLAX/MIRALAX Take 17 g by mouth daily as needed (constipation).   traMADol 50 MG tablet Commonly known as: ULTRAM Take 1 tablet (50 mg total) by mouth every 6 (six) hours. What changed:   when to take this  reasons to take this  additional instructions   VITAMIN B-12 PO Take 1 tablet by mouth daily.   VITAMIN D3 PO Take 2 tablets by mouth daily.            Durable Medical Equipment  (From admission, onward)         Start     Ordered   07/26/19 1518  DME 3 n 1  Once     07/26/19 1517   07/26/19 1518  DME Walker rolling  Once    Question Answer Comment  Walker: With 5 Inch Wheels   Patient needs a walker to treat with the following condition Status post total replacement of left hip      07/26/19 1517          Diagnostic Studies: DG Pelvis Portable  Result Date: 07/26/2019 CLINICAL DATA:  Left hip replacement EXAM: PORTABLE PELVIS 1-2 VIEWS COMPARISON:  02/21/2019 FINDINGS: Total hip replacement on the left in satisfactory position and alignment. No fracture or complication. Right hip negative IMPRESSION: Satisfactory left hip replacement. Electronically Signed   By: Franchot Gallo M.D.   On: 07/26/2019 15:12   DG C-Arm 1-60 Min  Result Date: 07/26/2019 CLINICAL DATA:  Left hip replacement EXAM: OPERATIVE left HIP (WITH PELVIS IF PERFORMED) 2 VIEWS TECHNIQUE: Fluoroscopic spot image(s) were submitted for interpretation post-operatively. COMPARISON:  02/21/2019 FINDINGS: Left hip replacement in  satisfactory position and alignment. No complication. IMPRESSION: Satisfactory left hip replacement. Electronically Signed   By: Franchot Gallo M.D.   On: 07/26/2019 15:13   DG HIP OPERATIVE UNILAT W OR W/O PELVIS LEFT  Result Date: 07/26/2019 CLINICAL DATA:  Left hip replacement EXAM: OPERATIVE left HIP (WITH PELVIS IF PERFORMED) 2 VIEWS TECHNIQUE: Fluoroscopic spot image(s) were submitted for interpretation post-operatively. COMPARISON:  02/21/2019 FINDINGS: Left hip replacement in satisfactory position and alignment. No complication. IMPRESSION: Satisfactory left hip replacement. Electronically Signed   By: Franchot Gallo M.D.   On: 07/26/2019 15:13    Disposition: Discharge disposition: 01-Home or Self Care         Follow-up Information    Mcarthur Rossetti, MD. Go on 08/08/2019.   Specialty: Orthopedic Surgery Why: at 1:00 pm for your 2 week post-op appointment with Dr. Ninfa Linden. Contact information: [place] [address] [phone]        [facility] up.   Specialty: [place] Why: You will have in home physical therapy after discharge from the hospital. Someone from the agency will be in contact with you at your home to review the date/time of your first therapy appointment. Contact information: [address] [place] [address] [phone]            Signed: Erskine Emery 07/27/2019, 8:39 AM

## 2019-07-27 NOTE — Progress Notes (Signed)
PT worked with pt and cleared to go home with [facility]. AVS reviewed and all questions answered to satisfaction.  IV removed and pt belongings gathered. Pt dressed by family at bedside and pt ready to be wheeled down.

## 2019-07-27 NOTE — TOC Initial Note (Signed)
Transition of Care [facility]) - Initial/Assessment Note    Patient Details  Name: Sarah Manning MRN: [id number] Date of Birth: 11-Dec-1924  [facility]) CM/SW Contact:    Marilu Favre, RN Phone Number: 07/27/2019, 10:53 AM  Clinical Narrative:                 Patient from home with family. Patient already has walker, and raised toilet , does not want 3 in 1   Expected Discharge Plan: [place] Barriers to Discharge: No Barriers Identified   Patient Goals and CMS Choice Patient states their goals for this hospitalization and ongoing recovery are:: to go home CMS Medicare.gov Compare Post Acute Care list provided to:: Patient Choice offered to / list presented to : Patient  Expected Discharge Plan and Services Expected Discharge Plan: [place]   Discharge Planning Services: CM Consult Post Acute Care Choice: [place] arrangements for the past 2 months: Single Family Home Expected Discharge Date: 07/27/19               DME Arranged: N/A         HH Arranged: PT [place]: [facility] ([facility] [employer]) Date [place]: 07/27/19 Time [employer]: 53 Representative spoke with at [place]: Olivia Lopez de Gutierrez  Prior Living Arrangements/Services Living arrangements for the past 2 months: [place] Lives with:: Relatives Patient language and need for interpreter reviewed:: Yes Do you feel safe going back to the place where you live?: Yes      Need for Family Participation in Patient Care: Yes (Comment) Care giver support system in place?: Yes (comment) Current home services: DME Criminal Activity/Legal Involvement Pertinent to Current Situation/Hospitalization: No - Comment as needed  Activities of Daily Living Home Assistive Devices/Equipment: Walker (specify type), Eyeglasses, Blood pressure cuff, Dentures (specify type) ADL Screening (condition at time of admission) Patient's  cognitive ability adequate to safely complete daily activities?: Yes Is the patient deaf or have difficulty hearing?: Yes Does the patient have difficulty seeing, even when wearing glasses/contacts?: No Does the patient have difficulty concentrating, remembering, or making decisions?: No Patient able to express need for assistance with ADLs?: Yes Does the patient have difficulty dressing or bathing?: No Independently performs ADLs?: Yes (appropriate for developmental age) Does the patient have difficulty walking or climbing stairs?: Yes Weakness of Legs: Both Weakness of Arms/Hands: Both  Permission Sought/Granted   Permission granted to share information with : No              Emotional Assessment Appearance:: Appears stated age Attitude/Demeanor/Rapport: Engaged Affect (typically observed): Accepting Orientation: : Oriented to Self, Oriented to Place, Oriented to  Time, Oriented to Situation Alcohol / Substance Use: Not Applicable Psych Involvement: No (comment)  Admission diagnosis:  Status post total replacement of left hip [Z96.642] Patient Active Problem List   Diagnosis Date Noted  . Status post total replacement of left hip 07/26/2019  . Unilateral primary osteoarthritis, left hip 07/05/2018  . Primary osteoarthritis of left hip 10/24/2016  . Chronic cholecystitis with calculus 01/11/2015  . Symptomatic cholelithiasis 01/10/2015  . Diverticulosis of colon without hemorrhage 12/26/2014  . Hyperlipidemia 12/26/2014  . Hypothyroidism 12/26/2014   PCP:  Crist Infante, MD Pharmacy:   [place], [place] - [address] [place] [place] [address] Phone: [phone] Fax: [phone]     Social Determinants of Health (SDOH) Interventions  Readmission Risk Interventions No flowsheet data found.

## 2019-07-27 NOTE — Progress Notes (Signed)
Physical Therapy Treatment Patient Details Name: Sarah Manning MRN: [id number] DOB: 1924-10-29 Today's Date: 07/27/2019    History of Present Illness Pt is a 86 y/o female s/p L THA, direct anterior. PMH includes COPD, HTN, osteoporosis.     PT Comments    Continuing work on functional mobility and activity tolerance;  Much improved activity tolerance, without symptoms of dizziness or lightheadedness; Walking household distance slowly, but steadily, and stair training is complete; discussed car transfers; OK for dc home from PT standpoint; Questions solicited and answered    Follow Up Recommendations  Follow surgeon's recommendation for DC plan and follow-up therapies;Supervision for mobility/OOB     Equipment Recommendations  None recommended by PT    Recommendations for Other Services       Precautions / Restrictions Precautions Precautions: Fall Precaution Comments: Orthostatic on POD 0; no symptoms of dizziness/lightheadedness during Wed session Restrictions LLE Weight Bearing: Weight bearing as tolerated    Mobility  Bed Mobility Overal bed mobility: Needs Assistance Bed Mobility: Supine to Sit     Supine to sit: Min guard(without physical assist)     General bed mobility comments: minguard for safety; soving slowly, but well; did not need physical assist  Transfers Overall transfer level: Needs assistance Equipment used: Rolling walker (2 wheeled) Transfers: Sit to/from Stand Sit to Stand: Min guard(without physical contact)         General transfer comment: Cues for hand   Ambulation/Gait Ambulation/Gait assistance: Min guard(with and without physical contact) Gait Distance (Feet): 120 Feet Assistive device: Rolling walker (2 wheeled) Gait Pattern/deviations: Step-through pattern;Decreased step length - right;Decreased step length - left Gait velocity: Decreased   General Gait Details: Cues for step length and upright posture; also cues to  self-monitor for activity tolerance; asymptomatic for lightheadedness   Stairs Stairs: Yes Stairs assistance: Min guard Stair Management: One rail Left;Sideways;Step to pattern Number of Stairs: 3 General stair comments: cues for technique; no difficulty   Wheelchair Mobility    Modified Rankin (Stroke Patients Only)       Balance     Sitting balance-Leahy Scale: Good       Standing balance-Leahy Scale: Poor(approaching Fair)                              Cognition Arousal/Alertness: Awake/alert Behavior During Therapy: WFL for tasks assessed/performed Overall Cognitive Status: Within Functional Limits for tasks assessed                                        Exercises Total Joint Exercises Ankle Circles/Pumps: AROM;Both;10 reps;Seated Quad Sets: AROM;Both;10 reps Gluteal Sets: AROM;Both;10 reps Towel Squeeze: AROM;Both;10 reps Short Arc Quad: AROM;Left;10 reps Heel Slides: AAROM;AROM;Left;10 reps Hip ABduction/ADduction: AAROM;AROM;Left;10 reps    General Comments        Pertinent Vitals/Pain Pain Assessment: Faces Faces Pain Scale: Hurts a little bit Pain Location: L hip  Pain Descriptors / Indicators: Aching;Sore Pain Intervention(s): Monitored during session    Home Living                      Prior Function            PT Goals (current goals can now be found in the care plan section) Acute Rehab PT Goals Patient Stated Goal: to go home PT Goal Formulation: With patient Time  For Goal Achievement: 08/09/19 Potential to Achieve Goals: Good Progress towards PT goals: Progressing toward goals    Frequency    7X/week      PT Plan Current plan remains appropriate    Co-evaluation              AM-PAC PT "6 Clicks" Mobility   Outcome Measure  Help needed turning from your back to your side while in a flat bed without using bedrails?: A Little Help needed moving from lying on your back to  sitting on the side of a flat bed without using bedrails?: None Help needed moving to and from a bed to a chair (including a wheelchair)?: None Help needed standing up from a chair using your arms (e.g., wheelchair or bedside chair)?: A Little Help needed to walk in hospital room?: None Help needed climbing 3-5 steps with a railing? : A Little 6 Click Score: 21    End of Session Equipment Utilized During Treatment: Gait belt Activity Tolerance: Patient tolerated treatment well Patient left: in chair;with call bell/phone within reach;with chair alarm set Nurse Communication: Mobility status(OK for dc from PT standpoint) PT Visit Diagnosis: Other abnormalities of gait and mobility (R26.89);Muscle weakness (generalized) (M62.81);Difficulty in walking, not elsewhere classified (R26.2);Pain Pain - Right/Left: Left Pain - part of body: Hip     Time: [id number] PT Time Calculation (min) (ACUTE ONLY): 32 min  Charges:  $Gait Training: 8-22 mins $Therapeutic Exercise: 8-22 mins                     Roney Marion, PT  Acute Rehabilitation Services Pager [phone] Office [phone]    Sarah Manning 07/27/2019, 9:52 AM

## 2019-07-28 ENCOUNTER — Telehealth: Payer: Self-pay | Admitting: *Deleted

## 2019-07-28 NOTE — Care Plan (Signed)
Discharge call to patient to check status after discharge from hospital yesterday, 07/27/19. She verbalized she is doing well today. Very sore, but otherwise no issues. Has not heard from therapy today. CM contacted liaison and confirmed that someone will be coming out tomorrow and someone from [facility] will reach out to let her know when to expect them. Will continue to follow for CM needs.

## 2019-07-28 NOTE — Telephone Encounter (Signed)
Ortho bundle d/c call completed. 

## 2019-07-29 DIAGNOSIS — E039 Hypothyroidism, unspecified: Secondary | ICD-10-CM | POA: Diagnosis not present

## 2019-07-29 DIAGNOSIS — I10 Essential (primary) hypertension: Secondary | ICD-10-CM | POA: Diagnosis not present

## 2019-07-29 DIAGNOSIS — Z471 Aftercare following joint replacement surgery: Secondary | ICD-10-CM | POA: Diagnosis not present

## 2019-07-29 DIAGNOSIS — M359 Systemic involvement of connective tissue, unspecified: Secondary | ICD-10-CM | POA: Diagnosis not present

## 2019-07-29 DIAGNOSIS — K5731 Diverticulosis of large intestine without perforation or abscess with bleeding: Secondary | ICD-10-CM | POA: Diagnosis not present

## 2019-07-29 DIAGNOSIS — E785 Hyperlipidemia, unspecified: Secondary | ICD-10-CM | POA: Diagnosis not present

## 2019-07-29 DIAGNOSIS — J449 Chronic obstructive pulmonary disease, unspecified: Secondary | ICD-10-CM | POA: Diagnosis not present

## 2019-07-29 DIAGNOSIS — F329 Major depressive disorder, single episode, unspecified: Secondary | ICD-10-CM | POA: Diagnosis not present

## 2019-07-29 DIAGNOSIS — M810 Age-related osteoporosis without current pathological fracture: Secondary | ICD-10-CM | POA: Diagnosis not present

## 2019-08-01 ENCOUNTER — Telehealth: Payer: Self-pay | Admitting: *Deleted

## 2019-08-01 NOTE — Care Plan (Signed)
Patient called today at 6 days post-op to ask a few questions. She states she is doing well, but the hip and lower extremity are swollen down to the ankle. No pain or tenderness in calf area. She states therapy has been coming out to see her and they report she is doing well. Someone should be out today also. Reinforced that some swelling is normal. Discussed medications as well and she states she may change muscle relaxer to nighttime to help with relaxing hip at night for sleeping. Otherwise, having no issues. Reminded of appointment with Dr. Ninfa Linden scheduled for 08/08/19.

## 2019-08-01 NOTE — Telephone Encounter (Signed)
7 day Ortho bundle call completed. 

## 2019-08-02 ENCOUNTER — Telehealth: Payer: Self-pay | Admitting: *Deleted

## 2019-08-02 NOTE — Telephone Encounter (Signed)
Ortho bundle call completed. 

## 2019-08-02 NOTE — Care Plan (Signed)
Call from patient today and left VM. States that she has not yet heard from therapy and wants to know what she should do. CM reached out to [facility] explaining this. CM was told by patient yesterday that therapy had been out for her initial visit and was supposed to come yesterday afternoon, but apparently did not. Liaison for [facility] informed that the therapist that was supposed to let them know she needed to be seen had a family member suddenly pass away and this was missed yesterday. They are working on re-assigning and will have her seen tomorrow (Wednesday) and Friday this week. CM called patient and left message with her husband as to what had happened and that someone would be in touch regarding seeing her tomorrow. Patient was asleep when CM called.

## 2019-08-03 DIAGNOSIS — I10 Essential (primary) hypertension: Secondary | ICD-10-CM | POA: Diagnosis not present

## 2019-08-03 DIAGNOSIS — E039 Hypothyroidism, unspecified: Secondary | ICD-10-CM | POA: Diagnosis not present

## 2019-08-03 DIAGNOSIS — M359 Systemic involvement of connective tissue, unspecified: Secondary | ICD-10-CM | POA: Diagnosis not present

## 2019-08-03 DIAGNOSIS — Z471 Aftercare following joint replacement surgery: Secondary | ICD-10-CM | POA: Diagnosis not present

## 2019-08-03 DIAGNOSIS — F329 Major depressive disorder, single episode, unspecified: Secondary | ICD-10-CM | POA: Diagnosis not present

## 2019-08-03 DIAGNOSIS — E785 Hyperlipidemia, unspecified: Secondary | ICD-10-CM | POA: Diagnosis not present

## 2019-08-03 DIAGNOSIS — J449 Chronic obstructive pulmonary disease, unspecified: Secondary | ICD-10-CM | POA: Diagnosis not present

## 2019-08-03 DIAGNOSIS — M810 Age-related osteoporosis without current pathological fracture: Secondary | ICD-10-CM | POA: Diagnosis not present

## 2019-08-03 DIAGNOSIS — K5731 Diverticulosis of large intestine without perforation or abscess with bleeding: Secondary | ICD-10-CM | POA: Diagnosis not present

## 2019-08-05 DIAGNOSIS — M359 Systemic involvement of connective tissue, unspecified: Secondary | ICD-10-CM | POA: Diagnosis not present

## 2019-08-05 DIAGNOSIS — E039 Hypothyroidism, unspecified: Secondary | ICD-10-CM | POA: Diagnosis not present

## 2019-08-05 DIAGNOSIS — M810 Age-related osteoporosis without current pathological fracture: Secondary | ICD-10-CM | POA: Diagnosis not present

## 2019-08-05 DIAGNOSIS — F329 Major depressive disorder, single episode, unspecified: Secondary | ICD-10-CM | POA: Diagnosis not present

## 2019-08-05 DIAGNOSIS — I10 Essential (primary) hypertension: Secondary | ICD-10-CM | POA: Diagnosis not present

## 2019-08-05 DIAGNOSIS — E785 Hyperlipidemia, unspecified: Secondary | ICD-10-CM | POA: Diagnosis not present

## 2019-08-05 DIAGNOSIS — K5731 Diverticulosis of large intestine without perforation or abscess with bleeding: Secondary | ICD-10-CM | POA: Diagnosis not present

## 2019-08-05 DIAGNOSIS — J449 Chronic obstructive pulmonary disease, unspecified: Secondary | ICD-10-CM | POA: Diagnosis not present

## 2019-08-05 DIAGNOSIS — Z471 Aftercare following joint replacement surgery: Secondary | ICD-10-CM | POA: Diagnosis not present

## 2019-08-08 ENCOUNTER — Encounter: Payer: Self-pay | Admitting: Orthopaedic Surgery

## 2019-08-08 ENCOUNTER — Telehealth: Payer: Self-pay | Admitting: *Deleted

## 2019-08-08 ENCOUNTER — Ambulatory Visit (INDEPENDENT_AMBULATORY_CARE_PROVIDER_SITE_OTHER): Payer: Medicare HMO | Admitting: Orthopaedic Surgery

## 2019-08-08 ENCOUNTER — Other Ambulatory Visit: Payer: Self-pay

## 2019-08-08 DIAGNOSIS — Z96642 Presence of left artificial hip joint: Secondary | ICD-10-CM

## 2019-08-08 NOTE — Telephone Encounter (Signed)
Ortho bundle 14 day call completed. 

## 2019-08-08 NOTE — Care Plan (Signed)
Saw patient in office today for her 2 week appointment with Dr. Ninfa Linden. She was ambulating with a RW and states she is doing extremely well. MD removed staples and applied steri-strips. She reports she has 2 more in home therapy visits that she will complete and be finished with HHPT. Dr. Ninfa Linden would like to see her in 4 weeks back in office. Appointment scheduled for 09/05/19. She will call if she has any issues or questions.

## 2019-08-08 NOTE — Progress Notes (Signed)
The patient is 2 weeks status post a left total hip arthroplasty.  She is 86 years old.  She comes in today with walking with a walker saying that she is doing great and is very pleased with the surgery thus far.  She has been taking her baby aspirin twice a day.  She has no issues other than leg pain at night.  She is very happy with her progress thus far.  Home health has been working with her.  On exam her left hip incision looks good.  I removed the staples in place Steri-Strips.  Her leg lengths are equal.  At this point she will continue the aspirin but just daily for a week and then she can stop her aspirin.  We will see her back in 4 weeks to see how she is doing overall but no x-rays are needed.  All questions and concerns were answered and addressed.  Sherrie Thomas the case manager also saw her today.

## 2019-08-09 DIAGNOSIS — E039 Hypothyroidism, unspecified: Secondary | ICD-10-CM | POA: Diagnosis not present

## 2019-08-09 DIAGNOSIS — K5731 Diverticulosis of large intestine without perforation or abscess with bleeding: Secondary | ICD-10-CM | POA: Diagnosis not present

## 2019-08-09 DIAGNOSIS — Z471 Aftercare following joint replacement surgery: Secondary | ICD-10-CM | POA: Diagnosis not present

## 2019-08-09 DIAGNOSIS — I10 Essential (primary) hypertension: Secondary | ICD-10-CM | POA: Diagnosis not present

## 2019-08-09 DIAGNOSIS — M810 Age-related osteoporosis without current pathological fracture: Secondary | ICD-10-CM | POA: Diagnosis not present

## 2019-08-09 DIAGNOSIS — M359 Systemic involvement of connective tissue, unspecified: Secondary | ICD-10-CM | POA: Diagnosis not present

## 2019-08-09 DIAGNOSIS — J449 Chronic obstructive pulmonary disease, unspecified: Secondary | ICD-10-CM | POA: Diagnosis not present

## 2019-08-09 DIAGNOSIS — E785 Hyperlipidemia, unspecified: Secondary | ICD-10-CM | POA: Diagnosis not present

## 2019-08-09 DIAGNOSIS — F329 Major depressive disorder, single episode, unspecified: Secondary | ICD-10-CM | POA: Diagnosis not present

## 2019-08-11 DIAGNOSIS — F329 Major depressive disorder, single episode, unspecified: Secondary | ICD-10-CM | POA: Diagnosis not present

## 2019-08-11 DIAGNOSIS — K5731 Diverticulosis of large intestine without perforation or abscess with bleeding: Secondary | ICD-10-CM | POA: Diagnosis not present

## 2019-08-11 DIAGNOSIS — E039 Hypothyroidism, unspecified: Secondary | ICD-10-CM | POA: Diagnosis not present

## 2019-08-11 DIAGNOSIS — I10 Essential (primary) hypertension: Secondary | ICD-10-CM | POA: Diagnosis not present

## 2019-08-11 DIAGNOSIS — M810 Age-related osteoporosis without current pathological fracture: Secondary | ICD-10-CM | POA: Diagnosis not present

## 2019-08-11 DIAGNOSIS — E785 Hyperlipidemia, unspecified: Secondary | ICD-10-CM | POA: Diagnosis not present

## 2019-08-11 DIAGNOSIS — Z471 Aftercare following joint replacement surgery: Secondary | ICD-10-CM | POA: Diagnosis not present

## 2019-08-11 DIAGNOSIS — M359 Systemic involvement of connective tissue, unspecified: Secondary | ICD-10-CM | POA: Diagnosis not present

## 2019-08-11 DIAGNOSIS — J449 Chronic obstructive pulmonary disease, unspecified: Secondary | ICD-10-CM | POA: Diagnosis not present

## 2019-08-23 ENCOUNTER — Telehealth: Payer: Self-pay | Admitting: *Deleted

## 2019-08-23 NOTE — Telephone Encounter (Signed)
Ortho bundle 30 day call completed with patient satisfaction survey.

## 2019-09-05 ENCOUNTER — Encounter: Payer: Self-pay | Admitting: Orthopaedic Surgery

## 2019-09-05 ENCOUNTER — Other Ambulatory Visit: Payer: Self-pay

## 2019-09-05 ENCOUNTER — Ambulatory Visit (INDEPENDENT_AMBULATORY_CARE_PROVIDER_SITE_OTHER): Payer: Medicare HMO | Admitting: Orthopaedic Surgery

## 2019-09-05 DIAGNOSIS — Z96642 Presence of left artificial hip joint: Secondary | ICD-10-CM

## 2019-09-05 NOTE — Progress Notes (Signed)
The patient is now almost 6 weeks status post a left total hip arthroplasty.  She is 85 years old.  She is using her walker today but says she mainly gets around at home with just a cane and sometimes nothing.  Overall she feels like she is doing very well.  She is not taking any type medications for pain.  She is completed physical therapy as well.  She reports increases in her range of motion with her left hip and her strength.  She feels significantly better than what she did before surgery.  Examination of her left hip shows fluid and full range of motion.  Her incisions healed nicely.  Her leg lengths are equal.  She tolerates me easily putting her hip through range of motion.  At this standpoint she can drive.  I would like to see her back in 6 weeks to see how she is doing overall.  At that visit I would like a standing low AP pelvis because after that we may release her.

## 2019-09-07 MED ORDER — TRIAMCINOLONE ACETONIDE 40 MG/ML IJ SUSP
60.0000 mg | INTRAMUSCULAR | Status: AC | PRN
  Administered 2019-06-19: 16:00:00 60 mg via INTRA_ARTICULAR

## 2019-09-07 MED ORDER — BUPIVACAINE HCL 0.25 % IJ SOLN
4.0000 mL | INTRAMUSCULAR | Status: AC | PRN
  Administered 2019-06-19: 16:00:00 4 mL via INTRA_ARTICULAR

## 2019-09-07 NOTE — Progress Notes (Signed)
   CHENICE DOMENICK - 86 y.o. female MRN [id number]  Date of birth: 04-15-1925  Office Visit Note: Visit Date: 06/16/2019 PCP: Crist Infante, MD Referred by: Crist Infante, MD  Subjective: Chief Complaint  Patient presents with  . Left Hip - Pain   HPI:  RESHUNDA URREGO is a 86 y.o. female who comes in today Patient with history of end-stage osteoarthritis of the left hip.  Prior intra-articular injections have been beneficial but have been becoming increasingly short-lived.  She is looking at potentially getting her hip replaced.  ROS Otherwise per HPI.  Assessment & Plan: Visit Diagnoses:  1. Pain in left hip     Plan: No additional findings.   Meds & Orders: No orders of the defined types were placed in this encounter.   Orders Placed This Encounter  Procedures  . XR C-ARM NO REPORT    Follow-up: No follow-ups on file.   Procedures: Large Joint Inj: L hip joint on 06/19/2019 3:32 PM Indications: diagnostic evaluation and pain Details: 22 G 3.5 in needle, fluoroscopy-guided anterior approach  Arthrogram: No  Medications: 4 mL bupivacaine 0.25 %; 60 mg triamcinolone acetonide 40 MG/ML Outcome: tolerated well, no immediate complications  There was excellent flow of contrast producing a partial arthrogram of the hip. The patient did have relief of symptoms during the anesthetic phase of the injection. Procedure, treatment alternatives, risks and benefits explained, specific risks discussed. Consent was given by the patient. Immediately prior to procedure a time out was called to verify the correct patient, procedure, equipment, support staff and site/side marked as required. Patient was prepped and draped in the usual sterile fashion.      No notes on file   Clinical History: No specialty comments available.     Objective:  VS:  HT:    WT:   BMI:     BP:   HR: bpm  TEMP: ( )  RESP:  Physical Exam  Ortho Exam Imaging: No results found.

## 2019-09-08 DIAGNOSIS — Z0000 Encounter for general adult medical examination without abnormal findings: Secondary | ICD-10-CM | POA: Diagnosis not present

## 2019-09-08 DIAGNOSIS — D649 Anemia, unspecified: Secondary | ICD-10-CM | POA: Diagnosis not present

## 2019-09-08 DIAGNOSIS — E782 Mixed hyperlipidemia: Secondary | ICD-10-CM | POA: Diagnosis not present

## 2019-09-08 DIAGNOSIS — E038 Other specified hypothyroidism: Secondary | ICD-10-CM | POA: Diagnosis not present

## 2019-09-08 DIAGNOSIS — M810 Age-related osteoporosis without current pathological fracture: Secondary | ICD-10-CM | POA: Diagnosis not present

## 2019-09-12 DIAGNOSIS — Z1331 Encounter for screening for depression: Secondary | ICD-10-CM | POA: Diagnosis not present

## 2019-09-12 DIAGNOSIS — G629 Polyneuropathy, unspecified: Secondary | ICD-10-CM | POA: Diagnosis not present

## 2019-09-12 DIAGNOSIS — D649 Anemia, unspecified: Secondary | ICD-10-CM | POA: Diagnosis not present

## 2019-09-12 DIAGNOSIS — M810 Age-related osteoporosis without current pathological fracture: Secondary | ICD-10-CM | POA: Diagnosis not present

## 2019-09-12 DIAGNOSIS — R011 Cardiac murmur, unspecified: Secondary | ICD-10-CM | POA: Diagnosis not present

## 2019-09-12 DIAGNOSIS — F418 Other specified anxiety disorders: Secondary | ICD-10-CM | POA: Diagnosis not present

## 2019-09-12 DIAGNOSIS — M069 Rheumatoid arthritis, unspecified: Secondary | ICD-10-CM | POA: Diagnosis not present

## 2019-09-12 DIAGNOSIS — H6981 Other specified disorders of Eustachian tube, right ear: Secondary | ICD-10-CM | POA: Diagnosis not present

## 2019-09-12 DIAGNOSIS — M25552 Pain in left hip: Secondary | ICD-10-CM | POA: Diagnosis not present

## 2019-09-12 DIAGNOSIS — Z0000 Encounter for general adult medical examination without abnormal findings: Secondary | ICD-10-CM | POA: Diagnosis not present

## 2019-09-21 ENCOUNTER — Ambulatory Visit: Payer: Medicare HMO | Admitting: Physician Assistant

## 2019-10-12 DIAGNOSIS — D485 Neoplasm of uncertain behavior of skin: Secondary | ICD-10-CM | POA: Diagnosis not present

## 2019-10-12 DIAGNOSIS — L821 Other seborrheic keratosis: Secondary | ICD-10-CM | POA: Diagnosis not present

## 2019-10-12 DIAGNOSIS — D2261 Melanocytic nevi of right upper limb, including shoulder: Secondary | ICD-10-CM | POA: Diagnosis not present

## 2019-10-12 DIAGNOSIS — H61001 Unspecified perichondritis of right external ear: Secondary | ICD-10-CM | POA: Diagnosis not present

## 2019-10-17 ENCOUNTER — Ambulatory Visit: Payer: Medicare HMO | Admitting: Orthopaedic Surgery

## 2019-10-19 ENCOUNTER — Ambulatory Visit (INDEPENDENT_AMBULATORY_CARE_PROVIDER_SITE_OTHER): Payer: Medicare HMO

## 2019-10-19 ENCOUNTER — Other Ambulatory Visit: Payer: Self-pay

## 2019-10-19 ENCOUNTER — Encounter: Payer: Self-pay | Admitting: Orthopaedic Surgery

## 2019-10-19 ENCOUNTER — Ambulatory Visit (INDEPENDENT_AMBULATORY_CARE_PROVIDER_SITE_OTHER): Payer: Medicare HMO | Admitting: Orthopaedic Surgery

## 2019-10-19 DIAGNOSIS — Z96642 Presence of left artificial hip joint: Secondary | ICD-10-CM

## 2019-10-19 NOTE — Progress Notes (Signed)
The patient is a very pleasant 86 year old female who is just under 3 months out from a left total hip arthroplasty which was an elective surgery to treat the pain from osteoarthritis.  She reports that she is doing well.  She does ambulate out in public with a cane but at home does not use it.  She has been having some left-sided low back pain which she feels is from overcompensation.  She does not feel like she needs anything done for that.  On exam her left operative hip moves well with no pain.  Her right hip moves well.  A low AP pelvis and lateral of her left operative hip shows a well-seated implant with no complicating features.  At this point follow-up will be as needed since she is doing so well and given her age.  She understands that if she has any issues at all she should not hesitate to come back and see [place].  All questions and concerns were answered and addressed.

## 2019-10-24 ENCOUNTER — Telehealth: Payer: Self-pay | Admitting: *Deleted

## 2019-10-24 NOTE — Telephone Encounter (Signed)
Ortho bundle 90 day completed.

## 2019-10-25 DIAGNOSIS — L98499 Non-pressure chronic ulcer of skin of other sites with unspecified severity: Secondary | ICD-10-CM | POA: Diagnosis not present

## 2019-10-25 DIAGNOSIS — D485 Neoplasm of uncertain behavior of skin: Secondary | ICD-10-CM | POA: Diagnosis not present

## 2019-11-08 ENCOUNTER — Encounter: Payer: Self-pay | Admitting: Nurse Practitioner

## 2019-11-08 DIAGNOSIS — D649 Anemia, unspecified: Secondary | ICD-10-CM | POA: Diagnosis not present

## 2019-11-08 DIAGNOSIS — E038 Other specified hypothyroidism: Secondary | ICD-10-CM | POA: Diagnosis not present

## 2019-11-08 DIAGNOSIS — I10 Essential (primary) hypertension: Secondary | ICD-10-CM | POA: Diagnosis not present

## 2019-11-08 DIAGNOSIS — E039 Hypothyroidism, unspecified: Secondary | ICD-10-CM | POA: Diagnosis not present

## 2019-11-08 DIAGNOSIS — R82998 Other abnormal findings in urine: Secondary | ICD-10-CM | POA: Diagnosis not present

## 2019-12-01 ENCOUNTER — Encounter: Payer: Self-pay | Admitting: Nurse Practitioner

## 2019-12-25 ENCOUNTER — Other Ambulatory Visit: Payer: Self-pay | Admitting: Physician Assistant

## 2019-12-25 ENCOUNTER — Other Ambulatory Visit: Payer: Self-pay | Admitting: Family Medicine

## 2019-12-26 NOTE — Telephone Encounter (Signed)
Can you advise for Ninfa Linden please Patient had hip replacement in Feb 2021

## 2020-01-04 ENCOUNTER — Ambulatory Visit: Payer: Medicare HMO | Admitting: Nurse Practitioner

## 2020-01-04 ENCOUNTER — Encounter: Payer: Self-pay | Admitting: Nurse Practitioner

## 2020-01-04 VITALS — BP 108/50 | HR 68 | Ht 65.0 in | Wt 132.0 lb

## 2020-01-04 DIAGNOSIS — R150 Incomplete defecation: Secondary | ICD-10-CM

## 2020-01-04 DIAGNOSIS — K6289 Other specified diseases of anus and rectum: Secondary | ICD-10-CM | POA: Diagnosis not present

## 2020-01-04 NOTE — Progress Notes (Signed)
01/05/2020 Sarah Manning [id number] 1924/06/15   CHIEF COMPLAINT: Anal burning discomfort, incomplete BMs   HISTORY OF PRESENT ILLNESS:  Sarah Manning is a 86 year old female with a past medical history of arthritis, osteoporosis, depression, hypertension, hypercholesterolemia, hypothyroidism, diverticular bleed 2008, anemia and + ANA/double stranded DNA levels. Past cholecystectomy and left total hip replacement 07/26/2019. Her Hg dropped 11.8 to 9.5 post left hip surgery. Repeat Hg 10.9 on 11/08/2019. She reports having a history of anemia since childhood. She presents today with complaints of  incomplete stool evacuation for the past year with some anal burning "discomfort" which occurs 2 to 3 times weekly but over the past week or so has abated. She was prescribed Hydrocortisone suppositories by her PCP but she did not notice any immediate symptom relief after taking. She started taking Maalox 1 capful daily which resulted in passing a loose stool daily. She then reduced Maalox to  1/3rd capful 2 to 3 days weekly which resulted in passing a soft stool with increased stool output and less anal burning. She is taking Metamucil daily. No rectal bleeding. No abdominal pain. Sometimes has abdominal bloat. Her appetite is good. No weight loss. History of a tubular adenomatous colon polyp in 2005. Her most recent colonoscopy was in 2008 which showed sigmoid diverticulosis and internal hemorrhoids. No GERD symptoms. EGD in 2016 showed esophageal candidiasis. Brother with history of stomach cancer. No family history of colon cancer. No ASA or other NSAIDs.   Labs 11/08/2019: WBC 5.37. Hg 10.9. HCT 34.8. MCV 96.5. TSH 7.45.   EGD 02/21/2015 by Dr. Scarlette Shorts: 1. Candida esophagitis.   EGD 08/03/2006 by Dr. Fuller Plan at [facility] due to anemia/hematochezia. Normal   Colonoscopy 08/08/2006 by Dr. Fuller Plan due to anemia/hematochezia: Sigmoid diverticulosis Internal hemorrhoids  Lower GI bleed most likely  diverticular   Colonoscopy 08/07/2003 by Dr. Henrene Pastor: 4 mm adenomatous polyp removed from the cecum Sigmoid diverticulosis   Social History:  Married. Retired. Nonsmoker. She drinks one glass of wine a few evenings weekly.   Family History: Mother with history of Alzheimer's disease. Father with heart disease. Brother with prostate cancer. Brother with stomach cancer. Sister with a history of cancer, further details unknown.    Allergies  Allergen Reactions  . Amoxicillin Itching, Rash and Other (See Comments)    Did it involve swelling of the face/tongue/throat, SOB, or low BP? No Did it involve sudden or severe rash/hives, skin peeling, or any reaction on the inside of your mouth or nose? Yes Did you need to seek medical attention at a hospital or doctor's office? Yes When did it last happen?86 yrs old If all above answers are "NO", may proceed with cephalosporin use.   . Aspirin Other (See Comments)    Excessive blood thinning  . Penicillins Itching, Rash and Other (See Comments)    Did it involve swelling of the face/tongue/throat, SOB, or low BP? No Did it involve sudden or severe rash/hives, skin peeling, or any reaction on the inside of your mouth or nose? Yes Did you need to seek medical attention at a hospital or doctor's office? Yes When did it last happen?86 yrs old If all above answers are "NO", may proceed with cephalosporin use.      Outpatient Encounter Medications as of 01/04/2020  Medication Sig  . acetaminophen (TYLENOL) 325 MG tablet Take 2 tablets (650 mg total) by mouth every 6 (six) hours as needed for mild pain (or temp > 100). (Patient taking  differently: Take 325-650 mg by mouth every 6 (six) hours as needed for mild pain (or temp > 100). )  . atorvastatin (LIPITOR) 10 MG tablet Take 10 mg by mouth at bedtime.   . Cholecalciferol (VITAMIN D3 PO) Take 2 tablets by mouth daily.  . clonazePAM (KLONOPIN) 0.5 MG tablet Take 0.5 mg by mouth 2 (two) times  daily as needed for anxiety.   . Cyanocobalamin (VITAMIN B-12 PO) Take 1 tablet by mouth daily.   . diclofenac Sodium (VOLTAREN) 1 % GEL APPLY 2 GRAMS TOPICALLY 4 TIMES DAILY AS NEEDED  . escitalopram (LEXAPRO) 20 MG tablet Take 20 mg by mouth daily.   Marland Kitchen gabapentin (NEURONTIN) 100 MG capsule Take 200 mg by mouth 2 (two) times daily.   . hydroxypropyl methylcellulose / hypromellose (ISOPTO TEARS / GONIOVISC) 2.5 % ophthalmic solution Place 1 drop into both eyes 3 (three) times daily as needed for dry eyes.   Marland Kitchen levothyroxine (SYNTHROID) 125 MCG tablet Take 125 mcg by mouth daily before breakfast.  . lisinopril (PRINIVIL,ZESTRIL) 20 MG tablet Take 20 mg by mouth daily.   . Multiple Vitamin (MULTIVITAMIN WITH MINERALS) TABS tablet Take 2 tablets by mouth daily.   . mupirocin ointment (BACTROBAN) 2 % 1 application as needed.  . pantoprazole (PROTONIX) 40 MG tablet One tablet orally qam 30 mins before meals (Patient taking differently: Take 40 mg by mouth daily before breakfast. )  . polyethylene glycol powder (GLYCOLAX/MIRALAX) powder Take 17 g by mouth daily as needed (constipation).   . Probiotic Product (ALIGN) 4 MG CAPS Take 1 capsule by mouth daily.  . traMADol (ULTRAM) 50 MG tablet TAKE 1 TABLET BY MOUTH EVERY 6 HOURS  . [DISCONTINUED] aspirin 81 MG chewable tablet Chew 1 tablet (81 mg total) by mouth 2 (two) times daily.  . [DISCONTINUED] methocarbamol (ROBAXIN) 500 MG tablet Take 1 tablet (500 mg total) by mouth every 6 (six) hours as needed for muscle spasms.   No facility-administered encounter medications on file as of 01/04/2020.     REVIEW OF SYSTEMS: All other systems reviewed and negative except where noted in the History of Present Illness.  Gen: Denies fever, sweats or chills. No weight loss.  CV: Denies chest pain, palpitations or edema. Resp: Denies cough, shortness of breath of hemoptysis.  GI: Denies heartburn, dysphagia, stomach or lower abdominal pain. No diarrhea or  constipation.  GU : Denies urinary burning, blood in urine, increased urinary frequency or incontinence. MS: Denies joint pain, muscles aches or weakness. Derm: Denies rash, itchiness, skin lesions or unhealing ulcers. Psych: Denies depression, anxiety, memory loss, suicidal ideation and confusion. Heme: Denies bruising, bleeding. Neuro:  Denies headaches, dizziness or paresthesias. Endo:  Denies any problems with DM, thyroid or adrenal function.    PHYSICAL EXAM: BP (!) 108/50   Pulse 68   Ht 5\' 5"  (1.651 m)   Wt 132 lb (59.9 kg)   BMI 21.97 kg/m  Weight 127lbs by PCP on 09/12/2019.  General: Well developed 86 year old female in no acute distress. Head: Normocephalic and atraumatic. Eyes:  Sclerae non-icteric, conjunctive pink. Ears: Normal auditory acuity. Mouth: Upper dentures, lower partials.  No ulcers or lesions.  Neck: Supple, no lymphadenopathy or thyromegaly.  Lungs: Clear bilaterally to auscultation without wheezes, crackles or rhonchi. Heart: Regular rate and rhythm. Soft systolic murmur. No rub or gallop appreciated.  Abdomen: Soft, nontender, non distended. No masses. No hepatosplenomegaly. Normoactive bowel sounds x 4 quadrants.  Rectal: Minor posterior anal fold/tag. No external  hemorrhoids. No stool or mass in the rectal vault. Anoscopy exam showed small noninflammed  Ana hemorrhoids. The visualized anal/rectal mucosa was pink without erythema or exudate. Olivia Mackie CMA present during exam.  Musculoskeletal: Symmetrical with no gross deformities. Skin: Warm and dry. No rash or lesions on visible extremities. Extremities: No edema. Neurological: Alert oriented x 4, no focal deficits. Cognitively grossly intact. Slow gait but steady.  Psychological:  Alert and cooperative. Normal mood and affect.  ASSESSMENT AND PLAN:  96. 86 year old female with incomplete stool evacuation with anal burning discomfort which mostly resolved  after taking Maalox 2 to 3 days weekly and  Metamucil. Anoscopy exam showed minor anal hemorrhoids. TSH 7.45 which may have added to constipation.  -Continue Maalox and Metamucil.  -May use Miralax Q HS if Maalox stops working -Patient will call our office if symptoms recur -Consider a flexible sigmoidoscopy if symptoms recur -Continue follow up with PCP regarding hypothyroidism on Levothyroxin  2. History of an adenomatous colon polyps -No further colonoscopies recommended due to age unless medically necessary   3. Chronic normocytic anemia -Advised patient to continue follow up with PCP, consider checking iron panel and Vitamin B12 levels next lab draw      CC:  Crist Infante, MD

## 2020-01-04 NOTE — Patient Instructions (Addendum)
If you are age 84 or older, your body mass index should be between 23-30. Your Body mass index is 21.97 kg/m. If this is out of the aforementioned range listed, please consider follow up with your Primary Care Provider.  If you are age 25 or younger, your body mass index should be between 19-25. Your Body mass index is 21.97 kg/m. If this is out of the aformentioned range listed, please consider follow up with your Primary Care Provider.   Apply a small amount of Desitin inside the anal opening and to the external anal area tid as needed for anal or hemorrhoidal irritation/bleeding.   If taking Maalox stops working for you I suggest you try using Miralax 1 capful in 8 ounces of water at bedtime as needed.  Call the office should your symptoms recur. [phone]  Due to recent changes in healthcare laws, you may see the results of your imaging and laboratory studies on MyChart before your provider has had a chance to review them.  We understand that in some cases there may be results that are confusing or concerning to you. Not all laboratory results come back in the same time frame and the provider may be waiting for multiple results in order to interpret others.  Please give [place] 48 hours in order for your provider to thoroughly review all the results before contacting the office for clarification of your results.   Thank you for choosing [facility] Noralyn Pick, CRNP

## 2020-01-05 DIAGNOSIS — K6289 Other specified diseases of anus and rectum: Secondary | ICD-10-CM | POA: Insufficient documentation

## 2020-01-05 NOTE — Progress Notes (Signed)
Reviewed

## 2020-02-03 DIAGNOSIS — E039 Hypothyroidism, unspecified: Secondary | ICD-10-CM | POA: Diagnosis not present

## 2020-02-11 ENCOUNTER — Other Ambulatory Visit: Payer: Self-pay | Admitting: Family Medicine

## 2020-03-13 DIAGNOSIS — N390 Urinary tract infection, site not specified: Secondary | ICD-10-CM | POA: Diagnosis not present

## 2020-03-13 DIAGNOSIS — M810 Age-related osteoporosis without current pathological fracture: Secondary | ICD-10-CM | POA: Diagnosis not present

## 2020-03-13 DIAGNOSIS — D649 Anemia, unspecified: Secondary | ICD-10-CM | POA: Diagnosis not present

## 2020-03-13 DIAGNOSIS — M069 Rheumatoid arthritis, unspecified: Secondary | ICD-10-CM | POA: Diagnosis not present

## 2020-03-13 DIAGNOSIS — G629 Polyneuropathy, unspecified: Secondary | ICD-10-CM | POA: Diagnosis not present

## 2020-03-13 DIAGNOSIS — R82998 Other abnormal findings in urine: Secondary | ICD-10-CM | POA: Diagnosis not present

## 2020-03-13 DIAGNOSIS — K219 Gastro-esophageal reflux disease without esophagitis: Secondary | ICD-10-CM | POA: Diagnosis not present

## 2020-03-13 DIAGNOSIS — I10 Essential (primary) hypertension: Secondary | ICD-10-CM | POA: Diagnosis not present

## 2020-03-13 DIAGNOSIS — E039 Hypothyroidism, unspecified: Secondary | ICD-10-CM | POA: Diagnosis not present

## 2020-03-13 DIAGNOSIS — E559 Vitamin D deficiency, unspecified: Secondary | ICD-10-CM | POA: Diagnosis not present

## 2020-03-19 ENCOUNTER — Encounter: Payer: Self-pay | Admitting: Physician Assistant

## 2020-03-19 ENCOUNTER — Ambulatory Visit: Payer: Self-pay

## 2020-03-19 ENCOUNTER — Ambulatory Visit: Payer: Medicare HMO | Admitting: Physician Assistant

## 2020-03-19 VITALS — Ht 65.0 in | Wt 130.0 lb

## 2020-03-19 DIAGNOSIS — M542 Cervicalgia: Secondary | ICD-10-CM | POA: Diagnosis not present

## 2020-03-19 DIAGNOSIS — M545 Low back pain, unspecified: Secondary | ICD-10-CM

## 2020-03-19 MED ORDER — METHYLPREDNISOLONE 4 MG PO TABS: ORAL_TABLET | ORAL | 0 refills | Status: DC

## 2020-03-19 NOTE — Progress Notes (Signed)
Office Visit Note   Patient: Sarah Manning           Date of Birth: 04-22-1925           MRN: [id number] Visit Date: 03/19/2020              Requested by: Crist Infante, MD [address] [place],  [place] [address] PCP: Crist Infante, MD   Assessment & Plan: Visit Diagnoses:  1. Neck pain   2. Acute bilateral low back pain, unspecified whether sciatica present     Plan: Recommended formal physical therapy for her neck and back patient defers.  Therefore handouts were given on back and neck exercises.We will place her on a Medrol dose pack.   Follow-Up Instructions: Return if symptoms worsen or fail to improve.   Orders:  Orders Placed This Encounter  Procedures  . XR Cervical Spine 2 or 3 views  . XR Lumbar Spine 2-3 Views   Meds ordered this encounter  Medications  . methylPREDNISolone (MEDROL) 4 MG tablet    Sig: Take as directed    Dispense:  21 tablet    Refill:  0      Procedures: No procedures performed   Clinical Data: No additional findings.   Subjective: Chief Complaint  Patient presents with  . Neck - Pain  . Lower Back - Pain    HPI  Review of Systems   Objective: Vital Signs: Ht 5\' 5"  (1.651 m)   Wt 130 lb (59 kg)   BMI 21.63 kg/m   Physical Exam Constitutional:      Appearance: She is not ill-appearing or diaphoretic.  Cardiovascular:     Pulses: Normal pulses.  Pulmonary:     Effort: Pulmonary effort is normal.  Neurological:     Mental Status: She is alert and oriented to person, place, and time.  Psychiatric:        Mood and Affect: Mood normal.     Ortho Exam Upper extremities:  Sensation intact bilateral hands to light touch. Full motor bilateral hands. 5/5 strengths bilateral upper extremities against resistance. Cervical spine: pain with rotation to left . Negative Spruling test.  Lumbar spine: She has negative straight leg raise pain.  5 out of 5 strength throughout lower extremities against resistance.  Limited  flexion extension lumbar spine.  Tenderness lower left para spinous lumbar spine. Bilateral hips: Good range of motion without pain. Specialty Comments:  No specialty comments available.  Imaging: XR Cervical Spine 2 or 3 views  Result Date: 03/19/2020 Cervical spine 2 views: No acute fractures.  Degenerative changes mid cervical spine with endplate spurring and loss of disc space.  XR Lumbar Spine 2-3 Views  Result Date: 03/19/2020 Lumbar spine: No acute fractures.  Arthrosclerosis of aorta noted.  Scoliosis with convexity to the right.  Grade 1 spondylolisthesis L5 on S1.  Loss of lordotic curvature.    PMFS History: Patient Active Problem List   Diagnosis Date Noted  . Anal burning 01/05/2020  . Status post total replacement of left hip 07/26/2019  . Unilateral primary osteoarthritis, left hip 07/05/2018  . Asymmetrical right sensorineural hearing loss 10/13/2017  . Primary osteoarthritis of left hip 10/24/2016  . Chronic cholecystitis with calculus 01/11/2015  . Symptomatic cholelithiasis 01/10/2015  . Diverticulosis of colon without hemorrhage 12/26/2014  . Hyperlipidemia 12/26/2014  . Hypothyroidism 12/26/2014   Past Medical History:  Diagnosis Date  . Arthritis   . Autoimmune disease ([place])    positive ANA, double stranded  DNA, anti-RO and anti- LA  . Cholelithiasis   . Colonic adenoma   . COPD (chronic obstructive pulmonary disease) ([place])    non smoker  . Depression   . Diverticulosis   . Heart murmur   . Hyperlipidemia   . Hypertension   . Hypothyroidism   . Osteoporosis     Family History  Problem Relation Age of Onset  . Heart disease Father   . Stomach cancer Brother   . Prostate cancer Brother   . Alzheimer's disease Mother   . Colon cancer Neg Hx   . Esophageal cancer Neg Hx   . Pancreatic cancer Neg Hx   . Liver disease Neg Hx     Past Surgical History:  Procedure Laterality Date  . CHOLECYSTECTOMY N/A 01/11/2015   Procedure: LAPAROSCOPIC  CHOLECYSTECTOMY WITH INTRAOPERATIVE CHOLANGIOGRAM;  Surgeon: Armandina Gemma, MD;  Location: [facility];  Service: General;  Laterality: N/A;  . TOTAL HIP ARTHROPLASTY Left 07/26/2019   Procedure: LEFT TOTAL HIP ARTHROPLASTY ANTERIOR APPROACH;  Surgeon: Mcarthur Rossetti, MD;  Location: [facility];  Service: Orthopedics;  Laterality: Left;  . WRIST FRACTURE SURGERY     Social History   Occupational History  . Occupation: Retired    Comment: retired from [employer]  . Smoking status: Never Smoker  . Smokeless tobacco: Never Used  Vaping Use  . Vaping Use: Never used  Substance and Sexual Activity  . Alcohol use: Yes    Alcohol/week: 0.0 standard drinks    Comment: Occ. alcohol  . Drug use: No  . Sexual activity: Not on file

## 2020-04-10 IMAGING — DX DG PORTABLE PELVIS
1 series · 1 of 1 positions shown · non-contrast
Comparison: 02/21/2019

CLINICAL DATA: Left hip replacement

EXAM:
PORTABLE PELVIS 1-2 VIEWS

[pelvis]
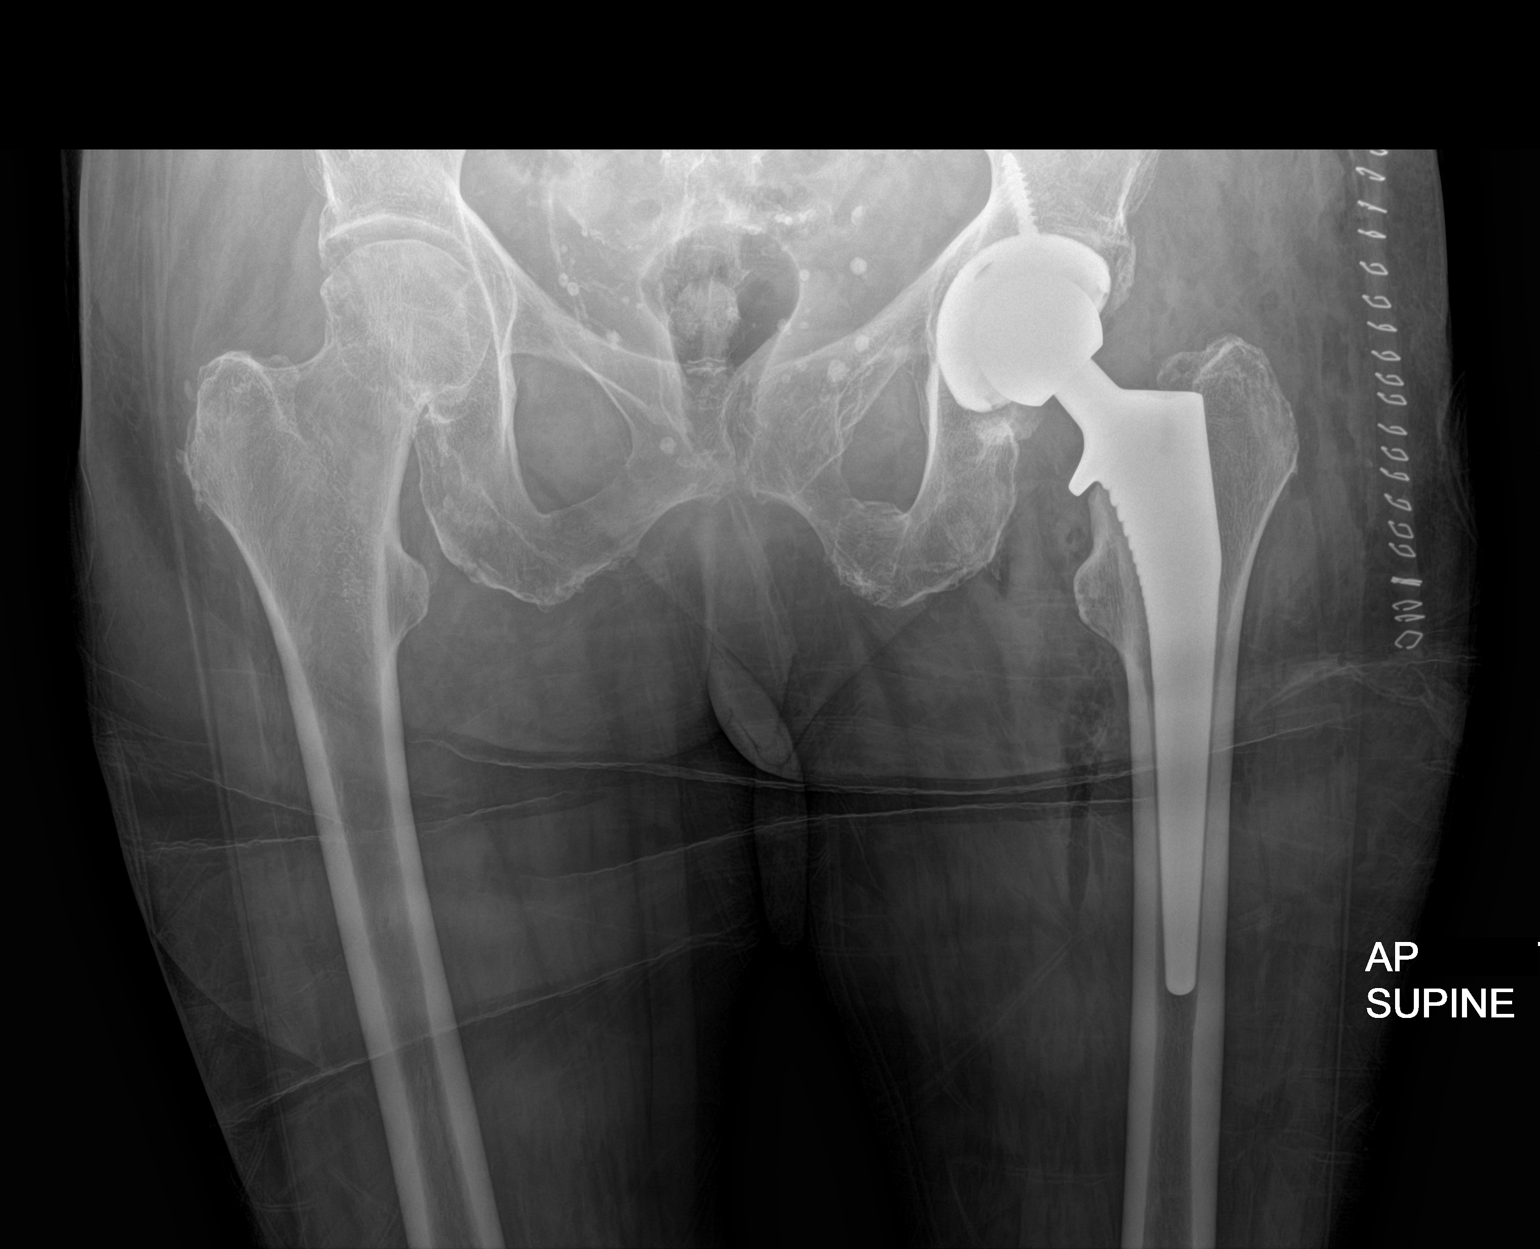

[1 of 1 positions shown; findings below may reference images not displayed]

FINDINGS: Total hip replacement on the left in satisfactory position and
alignment. No fracture or complication. Right hip negative
IMPRESSION: Satisfactory left hip replacement.

## 2020-05-11 ENCOUNTER — Other Ambulatory Visit: Payer: Self-pay | Admitting: Family Medicine

## 2020-06-27 ENCOUNTER — Telehealth: Payer: Self-pay

## 2020-06-27 ENCOUNTER — Telehealth: Payer: Self-pay | Admitting: Orthopaedic Surgery

## 2020-06-27 NOTE — Telephone Encounter (Signed)
Patient called she would like to know if she still needs to take antibiotics in order for her to got to the dentist. CB:[phone]

## 2020-06-27 NOTE — Telephone Encounter (Signed)
Pt states she has a dentist appt and they need to know if she will need an antibiotic? She would like a CB to let her know if she'll need on or not and she wanted to remind Dr.Blackman she's allergic to penicillin,asprin and amoxicillin if she does need one.  [phone]

## 2020-06-27 NOTE — Telephone Encounter (Signed)
Pt called and informed and stated understanding  °

## 2020-06-27 NOTE — Telephone Encounter (Signed)
See other message for the answer.

## 2020-06-27 NOTE — Telephone Encounter (Signed)
No only for the first 3 months after a joint arthroplasty

## 2020-06-28 NOTE — Telephone Encounter (Signed)
Note completed and faxed

## 2020-06-28 NOTE — Telephone Encounter (Signed)
Pt informed

## 2020-06-28 NOTE — Telephone Encounter (Signed)
Pt called stating her dentist is asking for documentation stating the pt doesn't have to have an antibiotic. They would like this faxed to [facility]  Fax# [phone]

## 2020-07-25 DIAGNOSIS — H04123 Dry eye syndrome of bilateral lacrimal glands: Secondary | ICD-10-CM | POA: Diagnosis not present

## 2020-07-25 DIAGNOSIS — H2513 Age-related nuclear cataract, bilateral: Secondary | ICD-10-CM | POA: Diagnosis not present

## 2020-07-27 ENCOUNTER — Telehealth: Payer: Self-pay | Admitting: *Deleted

## 2020-07-27 NOTE — Telephone Encounter (Signed)
1 year Ortho bundle call and survey completed.

## 2020-07-30 DIAGNOSIS — K219 Gastro-esophageal reflux disease without esophagitis: Secondary | ICD-10-CM | POA: Diagnosis not present

## 2020-07-30 DIAGNOSIS — E782 Mixed hyperlipidemia: Secondary | ICD-10-CM | POA: Diagnosis not present

## 2020-07-30 DIAGNOSIS — G629 Polyneuropathy, unspecified: Secondary | ICD-10-CM | POA: Diagnosis not present

## 2020-07-30 DIAGNOSIS — M810 Age-related osteoporosis without current pathological fracture: Secondary | ICD-10-CM | POA: Diagnosis not present

## 2020-07-30 DIAGNOSIS — M25552 Pain in left hip: Secondary | ICD-10-CM | POA: Diagnosis not present

## 2020-07-30 DIAGNOSIS — E039 Hypothyroidism, unspecified: Secondary | ICD-10-CM | POA: Diagnosis not present

## 2020-07-30 DIAGNOSIS — D649 Anemia, unspecified: Secondary | ICD-10-CM | POA: Diagnosis not present

## 2020-07-30 DIAGNOSIS — F418 Other specified anxiety disorders: Secondary | ICD-10-CM | POA: Diagnosis not present

## 2020-07-30 DIAGNOSIS — I10 Essential (primary) hypertension: Secondary | ICD-10-CM | POA: Diagnosis not present

## 2020-08-04 ENCOUNTER — Other Ambulatory Visit: Payer: Self-pay | Admitting: Family Medicine

## 2020-08-06 ENCOUNTER — Other Ambulatory Visit: Payer: Self-pay | Admitting: Family Medicine

## 2020-08-13 ENCOUNTER — Ambulatory Visit: Payer: Medicare HMO | Admitting: Physician Assistant

## 2020-08-13 ENCOUNTER — Other Ambulatory Visit: Payer: Self-pay

## 2020-08-13 ENCOUNTER — Encounter: Payer: Self-pay | Admitting: Physician Assistant

## 2020-08-13 DIAGNOSIS — M5440 Lumbago with sciatica, unspecified side: Secondary | ICD-10-CM

## 2020-08-13 MED ORDER — METHYLPREDNISOLONE 4 MG PO TABS: ORAL_TABLET | ORAL | 0 refills | Status: AC

## 2020-08-13 NOTE — Progress Notes (Signed)
Office Visit Note   Patient: Sarah Manning           Date of Birth: 03-05-1925           MRN: [id number] Visit Date: 08/13/2020              Requested by: Crist Infante, MD [address] [place],   [address] PCP: Crist Infante, MD   Assessment & Plan: Visit Diagnoses:  1. Acute bilateral low back pain with sciatica, sciatica laterality unspecified     Plan: We will have her continue to work on back exercises and stretching.  Place her again on a Medrol Dosepak if this does not relieve the pain that she is having recommend MRI of her epidural steroid injection planning.  She will follow-up with [place] on as-needed basis.  Follow-Up Instructions: Return if symptoms worsen or fail to improve.   Orders:  No orders of the defined types were placed in this encounter.  Meds ordered this encounter  Medications  . methylPREDNISolone (MEDROL) 4 MG tablet    Sig: Take as directed    Dispense:  21 tablet    Refill:  0      Procedures: No procedures performed   Clinical Data: No additional findings.   Subjective: Chief Complaint  Patient presents with  . Lower Back - Pain    HPI  Sarah Manning returns today for low back pain.  We last saw her in October 21 at that time she is having low back pain with radicular symptoms in both legs.  She did well after conservative treatment with Medrol Dosepak and a home exercise program.  However about 2 months ago she twisted and since then has had some low back pain with both of her legs falling asleep from the knees down.  She is having no bowel or bladder dysfunction no saddle anesthesia like symptoms.  No waking pain.  She has been taking Tylenol for the pain doing exercises and feels exercise does help with the pain.  Pain is worse first thing in the morning.  She ambulates with a walker outside the home mostly she just feels more secure with it but does not ambulate with a walker in the home.  Review of Systems  Constitutional:  Negative for chills and fever.  Musculoskeletal: Positive for back pain.     Objective: Vital Signs: There were no vitals taken for this visit.  Physical Exam Constitutional:      Appearance: She is not ill-appearing or diaphoretic.  Pulmonary:     Effort: Pulmonary effort is normal.  Neurological:     Mental Status: She is alert and oriented to person, place, and time.  Psychiatric:        Mood and Affect: Mood normal.     Ortho Exam Lower extremities: 5 out of 5 strength throughout against resistance negative straight leg raise bilaterally.  She has no tenderness with palpation over the lumbar spine.  She has limited flexion extension lumbar spine. Specialty Comments:  No specialty comments available.  Imaging: No results found.   PMFS History: Patient Active Problem List   Diagnosis Date Noted  . Anal burning 01/05/2020  . Status post total replacement of left hip 07/26/2019  . Unilateral primary osteoarthritis, left hip 07/05/2018  . Asymmetrical right sensorineural hearing loss 10/13/2017  . Primary osteoarthritis of left hip 10/24/2016  . Chronic cholecystitis with calculus 01/11/2015  . Symptomatic cholelithiasis 01/10/2015  . Diverticulosis of colon without hemorrhage 12/26/2014  .  Hyperlipidemia 12/26/2014  . Hypothyroidism 12/26/2014   Past Medical History:  Diagnosis Date  . Arthritis   . Autoimmune disease ([place])    positive ANA, double stranded DNA, anti-RO and anti- LA  . Cholelithiasis   . Colonic adenoma   . COPD (chronic obstructive pulmonary disease) ([place])    non smoker  . Depression   . Diverticulosis   . Heart murmur   . Hyperlipidemia   . Hypertension   . Hypothyroidism   . Osteoporosis     Family History  Problem Relation Age of Onset  . Heart disease Father   . Stomach cancer Brother   . Prostate cancer Brother   . Alzheimer's disease Mother   . Colon cancer Neg Hx   . Esophageal cancer Neg Hx   . Pancreatic cancer Neg Hx   .  Liver disease Neg Hx     Past Surgical History:  Procedure Laterality Date  . CHOLECYSTECTOMY N/A 01/11/2015   Procedure: LAPAROSCOPIC CHOLECYSTECTOMY WITH INTRAOPERATIVE CHOLANGIOGRAM;  Surgeon: Armandina Gemma, MD;  Location: [facility];  Service: General;  Laterality: N/A;  . TOTAL HIP ARTHROPLASTY Left 07/26/2019   Procedure: LEFT TOTAL HIP ARTHROPLASTY ANTERIOR APPROACH;  Surgeon: Mcarthur Rossetti, MD;  Location: [facility];  Service: Orthopedics;  Laterality: Left;  . WRIST FRACTURE SURGERY     Social History   Occupational History  . Occupation: Retired    Comment: retired from [employer]  . Smoking status: Never Smoker  . Smokeless tobacco: Never Used  Vaping Use  . Vaping Use: Never used  Substance and Sexual Activity  . Alcohol use: Yes    Alcohol/week: 0.0 standard drinks    Comment: Occ. alcohol  . Drug use: No  . Sexual activity: Not on file

## 2020-08-17 ENCOUNTER — Other Ambulatory Visit: Payer: Self-pay | Admitting: Physician Assistant

## 2020-08-20 NOTE — Telephone Encounter (Signed)
[place],  I gave her a prescription for Dosepak on 3/14.  Did she lose the prescription?  If not it is not appropriate to place her on a Medrol Dosepak again.

## 2020-08-20 NOTE — Telephone Encounter (Signed)
I called and advised pt. She just wasn't sure if this is something she needed to take longer. I advised her that it wasn't. She stated understanding

## 2020-09-13 DIAGNOSIS — E785 Hyperlipidemia, unspecified: Secondary | ICD-10-CM | POA: Diagnosis not present

## 2020-09-13 DIAGNOSIS — E559 Vitamin D deficiency, unspecified: Secondary | ICD-10-CM | POA: Diagnosis not present

## 2020-09-13 DIAGNOSIS — E039 Hypothyroidism, unspecified: Secondary | ICD-10-CM | POA: Diagnosis not present

## 2020-09-21 DIAGNOSIS — M810 Age-related osteoporosis without current pathological fracture: Secondary | ICD-10-CM | POA: Diagnosis not present

## 2020-09-21 DIAGNOSIS — F418 Other specified anxiety disorders: Secondary | ICD-10-CM | POA: Diagnosis not present

## 2020-09-21 DIAGNOSIS — Z1389 Encounter for screening for other disorder: Secondary | ICD-10-CM | POA: Diagnosis not present

## 2020-09-21 DIAGNOSIS — R82998 Other abnormal findings in urine: Secondary | ICD-10-CM | POA: Diagnosis not present

## 2020-09-21 DIAGNOSIS — M069 Rheumatoid arthritis, unspecified: Secondary | ICD-10-CM | POA: Diagnosis not present

## 2020-09-21 DIAGNOSIS — R011 Cardiac murmur, unspecified: Secondary | ICD-10-CM | POA: Diagnosis not present

## 2020-09-21 DIAGNOSIS — I10 Essential (primary) hypertension: Secondary | ICD-10-CM | POA: Diagnosis not present

## 2020-09-21 DIAGNOSIS — E782 Mixed hyperlipidemia: Secondary | ICD-10-CM | POA: Diagnosis not present

## 2020-09-21 DIAGNOSIS — E039 Hypothyroidism, unspecified: Secondary | ICD-10-CM | POA: Diagnosis not present

## 2020-09-21 DIAGNOSIS — Z0000 Encounter for general adult medical examination without abnormal findings: Secondary | ICD-10-CM | POA: Diagnosis not present

## 2020-09-24 DIAGNOSIS — H9311 Tinnitus, right ear: Secondary | ICD-10-CM | POA: Diagnosis not present

## 2020-09-24 DIAGNOSIS — H903 Sensorineural hearing loss, bilateral: Secondary | ICD-10-CM | POA: Diagnosis not present

## 2020-09-29 DIAGNOSIS — I10 Essential (primary) hypertension: Secondary | ICD-10-CM | POA: Diagnosis not present

## 2020-09-29 DIAGNOSIS — E782 Mixed hyperlipidemia: Secondary | ICD-10-CM | POA: Diagnosis not present

## 2020-10-18 DIAGNOSIS — M810 Age-related osteoporosis without current pathological fracture: Secondary | ICD-10-CM | POA: Diagnosis not present

## 2020-10-31 ENCOUNTER — Other Ambulatory Visit: Payer: Self-pay | Admitting: Family Medicine

## 2020-11-01 ENCOUNTER — Other Ambulatory Visit: Payer: Self-pay | Admitting: Physician Assistant

## 2020-11-01 MED ORDER — TRAMADOL HCL 50 MG PO TABS: 50.0000 mg | ORAL_TABLET | Freq: Four times a day (QID) | ORAL | 0 refills | Status: AC

## 2020-11-01 NOTE — Telephone Encounter (Signed)
Pt called and advise

## 2020-11-16 ENCOUNTER — Other Ambulatory Visit (HOSPITAL_COMMUNITY): Payer: Self-pay | Admitting: *Deleted

## 2020-11-20 ENCOUNTER — Other Ambulatory Visit: Payer: Self-pay

## 2020-11-20 ENCOUNTER — Ambulatory Visit (HOSPITAL_COMMUNITY)
Admission: RE | Admit: 2020-11-20 | Discharge: 2020-11-20 | Disposition: A | Discharge status: Home / Self Care | Payer: Medicare HMO | Source: Ambulatory Visit | Attending: Internal Medicine | Admitting: Internal Medicine

## 2020-11-20 DIAGNOSIS — M810 Age-related osteoporosis without current pathological fracture: Secondary | ICD-10-CM | POA: Diagnosis not present

## 2020-11-20 MED ORDER — DENOSUMAB 60 MG/ML ~~LOC~~ SOSY: 60.0000 mg | PREFILLED_SYRINGE | Freq: Once | SUBCUTANEOUS | Status: AC

## 2020-11-20 MED ORDER — DENOSUMAB 60 MG/ML ~~LOC~~ SOSY
PREFILLED_SYRINGE | SUBCUTANEOUS | Status: AC
  Administered 2020-11-20: 60 mg via SUBCUTANEOUS
  Filled 2020-11-20: qty 1

## 2020-11-29 DIAGNOSIS — I10 Essential (primary) hypertension: Secondary | ICD-10-CM | POA: Diagnosis not present

## 2020-11-29 DIAGNOSIS — E782 Mixed hyperlipidemia: Secondary | ICD-10-CM | POA: Diagnosis not present

## 2021-01-17 DIAGNOSIS — I10 Essential (primary) hypertension: Secondary | ICD-10-CM | POA: Diagnosis not present

## 2021-01-17 DIAGNOSIS — R42 Dizziness and giddiness: Secondary | ICD-10-CM | POA: Diagnosis not present

## 2021-01-17 DIAGNOSIS — F413 Other mixed anxiety disorders: Secondary | ICD-10-CM | POA: Diagnosis not present

## 2021-01-17 DIAGNOSIS — E559 Vitamin D deficiency, unspecified: Secondary | ICD-10-CM | POA: Diagnosis not present

## 2021-01-17 DIAGNOSIS — D649 Anemia, unspecified: Secondary | ICD-10-CM | POA: Diagnosis not present

## 2021-01-17 DIAGNOSIS — E039 Hypothyroidism, unspecified: Secondary | ICD-10-CM | POA: Diagnosis not present

## 2021-03-01 DIAGNOSIS — M810 Age-related osteoporosis without current pathological fracture: Secondary | ICD-10-CM | POA: Diagnosis not present

## 2021-03-01 DIAGNOSIS — I10 Essential (primary) hypertension: Secondary | ICD-10-CM | POA: Diagnosis not present

## 2021-03-01 DIAGNOSIS — E782 Mixed hyperlipidemia: Secondary | ICD-10-CM | POA: Diagnosis not present

## 2021-03-01 DIAGNOSIS — E039 Hypothyroidism, unspecified: Secondary | ICD-10-CM | POA: Diagnosis not present

## 2021-03-27 DIAGNOSIS — M0550 Rheumatoid polyneuropathy with rheumatoid arthritis of unspecified site: Secondary | ICD-10-CM | POA: Diagnosis not present

## 2021-03-27 DIAGNOSIS — E039 Hypothyroidism, unspecified: Secondary | ICD-10-CM | POA: Diagnosis not present

## 2021-03-27 DIAGNOSIS — M069 Rheumatoid arthritis, unspecified: Secondary | ICD-10-CM | POA: Diagnosis not present

## 2021-03-27 DIAGNOSIS — Z113 Encounter for screening for infections with a predominantly sexual mode of transmission: Secondary | ICD-10-CM | POA: Diagnosis not present

## 2021-03-27 DIAGNOSIS — F418 Other specified anxiety disorders: Secondary | ICD-10-CM | POA: Diagnosis not present

## 2021-03-27 DIAGNOSIS — I10 Essential (primary) hypertension: Secondary | ICD-10-CM | POA: Diagnosis not present

## 2021-03-27 DIAGNOSIS — E782 Mixed hyperlipidemia: Secondary | ICD-10-CM | POA: Diagnosis not present

## 2021-03-27 DIAGNOSIS — M810 Age-related osteoporosis without current pathological fracture: Secondary | ICD-10-CM | POA: Diagnosis not present

## 2021-03-27 DIAGNOSIS — Z23 Encounter for immunization: Secondary | ICD-10-CM | POA: Diagnosis not present

## 2021-03-27 DIAGNOSIS — R413 Other amnesia: Secondary | ICD-10-CM | POA: Diagnosis not present

## 2021-03-27 DIAGNOSIS — G629 Polyneuropathy, unspecified: Secondary | ICD-10-CM | POA: Diagnosis not present

## 2021-05-21 ENCOUNTER — Other Ambulatory Visit (HOSPITAL_COMMUNITY): Payer: Self-pay | Admitting: *Deleted

## 2021-05-22 ENCOUNTER — Ambulatory Visit (HOSPITAL_COMMUNITY)
Admission: RE | Admit: 2021-05-22 | Discharge: 2021-05-22 | Disposition: A | Discharge status: Home / Self Care | Payer: Medicare HMO | Source: Ambulatory Visit | Attending: Internal Medicine | Admitting: Internal Medicine

## 2021-05-22 ENCOUNTER — Other Ambulatory Visit: Payer: Self-pay

## 2021-05-22 DIAGNOSIS — M810 Age-related osteoporosis without current pathological fracture: Secondary | ICD-10-CM | POA: Diagnosis not present

## 2021-05-22 MED ORDER — DENOSUMAB 60 MG/ML ~~LOC~~ SOSY
PREFILLED_SYRINGE | SUBCUTANEOUS | Status: AC
  Filled 2021-05-22: qty 1

## 2021-05-22 MED ORDER — DENOSUMAB 60 MG/ML ~~LOC~~ SOSY
60.0000 mg | PREFILLED_SYRINGE | Freq: Once | SUBCUTANEOUS | Status: AC
  Administered 2021-05-22: 14:00:00 60 mg via SUBCUTANEOUS

## 2021-05-31 DIAGNOSIS — E782 Mixed hyperlipidemia: Secondary | ICD-10-CM | POA: Diagnosis not present

## 2021-05-31 DIAGNOSIS — I10 Essential (primary) hypertension: Secondary | ICD-10-CM | POA: Diagnosis not present

## 2021-05-31 DIAGNOSIS — E039 Hypothyroidism, unspecified: Secondary | ICD-10-CM | POA: Diagnosis not present

## 2021-05-31 DIAGNOSIS — M810 Age-related osteoporosis without current pathological fracture: Secondary | ICD-10-CM | POA: Diagnosis not present

## 2021-07-02 DIAGNOSIS — K219 Gastro-esophageal reflux disease without esophagitis: Secondary | ICD-10-CM | POA: Diagnosis not present

## 2021-07-02 DIAGNOSIS — I10 Essential (primary) hypertension: Secondary | ICD-10-CM | POA: Diagnosis not present

## 2021-07-02 DIAGNOSIS — M810 Age-related osteoporosis without current pathological fracture: Secondary | ICD-10-CM | POA: Diagnosis not present

## 2021-07-02 DIAGNOSIS — M069 Rheumatoid arthritis, unspecified: Secondary | ICD-10-CM | POA: Diagnosis not present

## 2021-07-02 DIAGNOSIS — E782 Mixed hyperlipidemia: Secondary | ICD-10-CM | POA: Diagnosis not present

## 2021-07-02 DIAGNOSIS — R413 Other amnesia: Secondary | ICD-10-CM | POA: Diagnosis not present

## 2021-07-02 DIAGNOSIS — G629 Polyneuropathy, unspecified: Secondary | ICD-10-CM | POA: Diagnosis not present

## 2021-07-02 DIAGNOSIS — D649 Anemia, unspecified: Secondary | ICD-10-CM | POA: Diagnosis not present

## 2021-09-27 DIAGNOSIS — E039 Hypothyroidism, unspecified: Secondary | ICD-10-CM | POA: Diagnosis not present

## 2021-09-27 DIAGNOSIS — E782 Mixed hyperlipidemia: Secondary | ICD-10-CM | POA: Diagnosis not present

## 2021-09-27 DIAGNOSIS — I10 Essential (primary) hypertension: Secondary | ICD-10-CM | POA: Diagnosis not present

## 2021-09-27 DIAGNOSIS — E559 Vitamin D deficiency, unspecified: Secondary | ICD-10-CM | POA: Diagnosis not present

## 2021-10-04 DIAGNOSIS — Z23 Encounter for immunization: Secondary | ICD-10-CM | POA: Diagnosis not present

## 2021-10-04 DIAGNOSIS — E782 Mixed hyperlipidemia: Secondary | ICD-10-CM | POA: Diagnosis not present

## 2021-10-04 DIAGNOSIS — M810 Age-related osteoporosis without current pathological fracture: Secondary | ICD-10-CM | POA: Diagnosis not present

## 2021-10-04 DIAGNOSIS — M0550 Rheumatoid polyneuropathy with rheumatoid arthritis of unspecified site: Secondary | ICD-10-CM | POA: Diagnosis not present

## 2021-10-04 DIAGNOSIS — R413 Other amnesia: Secondary | ICD-10-CM | POA: Diagnosis not present

## 2021-10-04 DIAGNOSIS — R82998 Other abnormal findings in urine: Secondary | ICD-10-CM | POA: Diagnosis not present

## 2021-10-04 DIAGNOSIS — G629 Polyneuropathy, unspecified: Secondary | ICD-10-CM | POA: Diagnosis not present

## 2021-10-04 DIAGNOSIS — Z1331 Encounter for screening for depression: Secondary | ICD-10-CM | POA: Diagnosis not present

## 2021-10-04 DIAGNOSIS — K219 Gastro-esophageal reflux disease without esophagitis: Secondary | ICD-10-CM | POA: Diagnosis not present

## 2021-10-04 DIAGNOSIS — Z0000 Encounter for general adult medical examination without abnormal findings: Secondary | ICD-10-CM | POA: Diagnosis not present

## 2021-10-04 DIAGNOSIS — I10 Essential (primary) hypertension: Secondary | ICD-10-CM | POA: Diagnosis not present

## 2021-10-04 DIAGNOSIS — E039 Hypothyroidism, unspecified: Secondary | ICD-10-CM | POA: Diagnosis not present

## 2021-10-04 DIAGNOSIS — Z1339 Encounter for screening examination for other mental health and behavioral disorders: Secondary | ICD-10-CM | POA: Diagnosis not present

## 2021-10-04 DIAGNOSIS — M069 Rheumatoid arthritis, unspecified: Secondary | ICD-10-CM | POA: Diagnosis not present

## 2021-11-25 ENCOUNTER — Encounter (HOSPITAL_COMMUNITY): Payer: Medicare HMO

## 2021-11-29 DIAGNOSIS — M180 Bilateral primary osteoarthritis of first carpometacarpal joints: Secondary | ICD-10-CM | POA: Diagnosis not present

## 2021-11-29 DIAGNOSIS — E039 Hypothyroidism, unspecified: Secondary | ICD-10-CM | POA: Diagnosis not present

## 2021-11-29 DIAGNOSIS — I10 Essential (primary) hypertension: Secondary | ICD-10-CM | POA: Diagnosis not present

## 2021-11-29 DIAGNOSIS — K219 Gastro-esophageal reflux disease without esophagitis: Secondary | ICD-10-CM | POA: Diagnosis not present

## 2022-05-08 DIAGNOSIS — E782 Mixed hyperlipidemia: Secondary | ICD-10-CM | POA: Diagnosis not present

## 2022-05-08 DIAGNOSIS — E039 Hypothyroidism, unspecified: Secondary | ICD-10-CM | POA: Diagnosis not present

## 2022-05-08 DIAGNOSIS — I10 Essential (primary) hypertension: Secondary | ICD-10-CM | POA: Diagnosis not present

## 2022-05-08 DIAGNOSIS — M810 Age-related osteoporosis without current pathological fracture: Secondary | ICD-10-CM | POA: Diagnosis not present

## 2022-05-08 DIAGNOSIS — G629 Polyneuropathy, unspecified: Secondary | ICD-10-CM | POA: Diagnosis not present

## 2022-05-08 DIAGNOSIS — M069 Rheumatoid arthritis, unspecified: Secondary | ICD-10-CM | POA: Diagnosis not present

## 2022-05-08 DIAGNOSIS — R413 Other amnesia: Secondary | ICD-10-CM | POA: Diagnosis not present

## 2022-05-08 DIAGNOSIS — F413 Other mixed anxiety disorders: Secondary | ICD-10-CM | POA: Diagnosis not present

## 2022-10-29 DIAGNOSIS — D649 Anemia, unspecified: Secondary | ICD-10-CM | POA: Diagnosis not present

## 2022-10-30 DIAGNOSIS — E559 Vitamin D deficiency, unspecified: Secondary | ICD-10-CM | POA: Diagnosis not present

## 2022-10-30 DIAGNOSIS — I10 Essential (primary) hypertension: Secondary | ICD-10-CM | POA: Diagnosis not present

## 2022-10-30 DIAGNOSIS — E782 Mixed hyperlipidemia: Secondary | ICD-10-CM | POA: Diagnosis not present

## 2022-10-30 DIAGNOSIS — E039 Hypothyroidism, unspecified: Secondary | ICD-10-CM | POA: Diagnosis not present

## 2022-10-30 DIAGNOSIS — D649 Anemia, unspecified: Secondary | ICD-10-CM | POA: Diagnosis not present

## 2022-10-30 DIAGNOSIS — K219 Gastro-esophageal reflux disease without esophagitis: Secondary | ICD-10-CM | POA: Diagnosis not present

## 2022-11-05 DIAGNOSIS — M069 Rheumatoid arthritis, unspecified: Secondary | ICD-10-CM | POA: Diagnosis not present

## 2022-11-05 DIAGNOSIS — G629 Polyneuropathy, unspecified: Secondary | ICD-10-CM | POA: Diagnosis not present

## 2022-11-05 DIAGNOSIS — Z1339 Encounter for screening examination for other mental health and behavioral disorders: Secondary | ICD-10-CM | POA: Diagnosis not present

## 2022-11-05 DIAGNOSIS — R82998 Other abnormal findings in urine: Secondary | ICD-10-CM | POA: Diagnosis not present

## 2022-11-05 DIAGNOSIS — Z0000 Encounter for general adult medical examination without abnormal findings: Secondary | ICD-10-CM | POA: Diagnosis not present

## 2022-11-05 DIAGNOSIS — J449 Chronic obstructive pulmonary disease, unspecified: Secondary | ICD-10-CM | POA: Diagnosis not present

## 2022-11-05 DIAGNOSIS — M810 Age-related osteoporosis without current pathological fracture: Secondary | ICD-10-CM | POA: Diagnosis not present

## 2022-11-05 DIAGNOSIS — I10 Essential (primary) hypertension: Secondary | ICD-10-CM | POA: Diagnosis not present

## 2022-11-05 DIAGNOSIS — E782 Mixed hyperlipidemia: Secondary | ICD-10-CM | POA: Diagnosis not present

## 2022-11-05 DIAGNOSIS — R011 Cardiac murmur, unspecified: Secondary | ICD-10-CM | POA: Diagnosis not present

## 2022-11-05 DIAGNOSIS — Z1331 Encounter for screening for depression: Secondary | ICD-10-CM | POA: Diagnosis not present

## 2022-11-05 DIAGNOSIS — E039 Hypothyroidism, unspecified: Secondary | ICD-10-CM | POA: Diagnosis not present

## 2022-12-15 DIAGNOSIS — H903 Sensorineural hearing loss, bilateral: Secondary | ICD-10-CM | POA: Diagnosis not present

## 2022-12-15 DIAGNOSIS — H6123 Impacted cerumen, bilateral: Secondary | ICD-10-CM | POA: Diagnosis not present

## 2023-04-29 DIAGNOSIS — M069 Rheumatoid arthritis, unspecified: Secondary | ICD-10-CM | POA: Diagnosis not present

## 2023-04-29 DIAGNOSIS — E039 Hypothyroidism, unspecified: Secondary | ICD-10-CM | POA: Diagnosis not present

## 2023-04-29 DIAGNOSIS — F413 Other mixed anxiety disorders: Secondary | ICD-10-CM | POA: Diagnosis not present

## 2023-04-29 DIAGNOSIS — G629 Polyneuropathy, unspecified: Secondary | ICD-10-CM | POA: Diagnosis not present

## 2023-04-29 DIAGNOSIS — F0390 Unspecified dementia without behavioral disturbance: Secondary | ICD-10-CM | POA: Diagnosis not present

## 2023-04-29 DIAGNOSIS — I10 Essential (primary) hypertension: Secondary | ICD-10-CM | POA: Diagnosis not present

## 2023-04-29 DIAGNOSIS — D649 Anemia, unspecified: Secondary | ICD-10-CM | POA: Diagnosis not present

## 2023-04-29 DIAGNOSIS — E782 Mixed hyperlipidemia: Secondary | ICD-10-CM | POA: Diagnosis not present

## 2023-06-25 DIAGNOSIS — L84 Corns and callosities: Secondary | ICD-10-CM | POA: Diagnosis not present

## 2023-06-25 DIAGNOSIS — M2042 Other hammer toe(s) (acquired), left foot: Secondary | ICD-10-CM | POA: Diagnosis not present

## 2023-06-25 DIAGNOSIS — M2041 Other hammer toe(s) (acquired), right foot: Secondary | ICD-10-CM | POA: Diagnosis not present

## 2023-08-27 DIAGNOSIS — F413 Other mixed anxiety disorders: Secondary | ICD-10-CM | POA: Diagnosis not present

## 2023-08-27 DIAGNOSIS — E782 Mixed hyperlipidemia: Secondary | ICD-10-CM | POA: Diagnosis not present

## 2023-08-27 DIAGNOSIS — F0390 Unspecified dementia without behavioral disturbance: Secondary | ICD-10-CM | POA: Diagnosis not present

## 2023-08-27 DIAGNOSIS — G629 Polyneuropathy, unspecified: Secondary | ICD-10-CM | POA: Diagnosis not present

## 2023-08-27 DIAGNOSIS — R011 Cardiac murmur, unspecified: Secondary | ICD-10-CM | POA: Diagnosis not present

## 2023-08-27 DIAGNOSIS — M069 Rheumatoid arthritis, unspecified: Secondary | ICD-10-CM | POA: Diagnosis not present

## 2023-08-27 DIAGNOSIS — I10 Essential (primary) hypertension: Secondary | ICD-10-CM | POA: Diagnosis not present

## 2023-08-27 DIAGNOSIS — M810 Age-related osteoporosis without current pathological fracture: Secondary | ICD-10-CM | POA: Diagnosis not present

## 2023-08-27 DIAGNOSIS — E039 Hypothyroidism, unspecified: Secondary | ICD-10-CM | POA: Diagnosis not present

## 2024-02-11 DIAGNOSIS — M810 Age-related osteoporosis without current pathological fracture: Secondary | ICD-10-CM | POA: Diagnosis not present

## 2024-02-11 DIAGNOSIS — I10 Essential (primary) hypertension: Secondary | ICD-10-CM | POA: Diagnosis not present

## 2024-02-11 DIAGNOSIS — E039 Hypothyroidism, unspecified: Secondary | ICD-10-CM | POA: Diagnosis not present

## 2024-02-11 DIAGNOSIS — E782 Mixed hyperlipidemia: Secondary | ICD-10-CM | POA: Diagnosis not present

## 2024-02-11 DIAGNOSIS — E7849 Other hyperlipidemia: Secondary | ICD-10-CM | POA: Diagnosis not present

## 2024-02-18 DIAGNOSIS — K219 Gastro-esophageal reflux disease without esophagitis: Secondary | ICD-10-CM | POA: Diagnosis not present

## 2024-02-18 DIAGNOSIS — E782 Mixed hyperlipidemia: Secondary | ICD-10-CM | POA: Diagnosis not present

## 2024-02-18 DIAGNOSIS — Z1331 Encounter for screening for depression: Secondary | ICD-10-CM | POA: Diagnosis not present

## 2024-02-18 DIAGNOSIS — I10 Essential (primary) hypertension: Secondary | ICD-10-CM | POA: Diagnosis not present

## 2024-02-18 DIAGNOSIS — Z23 Encounter for immunization: Secondary | ICD-10-CM | POA: Diagnosis not present

## 2024-02-18 DIAGNOSIS — Z1339 Encounter for screening examination for other mental health and behavioral disorders: Secondary | ICD-10-CM | POA: Diagnosis not present

## 2024-02-18 DIAGNOSIS — F0390 Unspecified dementia without behavioral disturbance: Secondary | ICD-10-CM | POA: Diagnosis not present

## 2024-02-18 DIAGNOSIS — R82998 Other abnormal findings in urine: Secondary | ICD-10-CM | POA: Diagnosis not present

## 2024-02-18 DIAGNOSIS — F413 Other mixed anxiety disorders: Secondary | ICD-10-CM | POA: Diagnosis not present

## 2024-02-18 DIAGNOSIS — Z0000 Encounter for general adult medical examination without abnormal findings: Secondary | ICD-10-CM | POA: Diagnosis not present

## 2024-02-18 DIAGNOSIS — E039 Hypothyroidism, unspecified: Secondary | ICD-10-CM | POA: Diagnosis not present

## 2024-02-18 DIAGNOSIS — M810 Age-related osteoporosis without current pathological fracture: Secondary | ICD-10-CM | POA: Diagnosis not present

## 2024-02-18 DIAGNOSIS — M069 Rheumatoid arthritis, unspecified: Secondary | ICD-10-CM | POA: Diagnosis not present

## 2024-03-01 ENCOUNTER — Other Ambulatory Visit: Payer: Self-pay | Admitting: Internal Medicine

## 2024-03-01 DIAGNOSIS — R1013 Epigastric pain: Secondary | ICD-10-CM

## 2024-03-11 ENCOUNTER — Other Ambulatory Visit
